# Patient Record
Sex: Male | Born: 1945 | Race: White | Hispanic: No | Marital: Married | Smoking: Former smoker
Health system: Southern US, Community
[De-identification: ages and names within clinical notes are randomized; demographics above are authoritative.]

## PROBLEM LIST (undated history)

## (undated) DIAGNOSIS — M069 Rheumatoid arthritis, unspecified: Secondary | ICD-10-CM

## (undated) DIAGNOSIS — C801 Malignant (primary) neoplasm, unspecified: Secondary | ICD-10-CM

## (undated) DIAGNOSIS — R918 Other nonspecific abnormal finding of lung field: Secondary | ICD-10-CM

## (undated) DIAGNOSIS — G893 Neoplasm related pain (acute) (chronic): Secondary | ICD-10-CM

## (undated) DIAGNOSIS — M549 Dorsalgia, unspecified: Secondary | ICD-10-CM

## (undated) DIAGNOSIS — N400 Enlarged prostate without lower urinary tract symptoms: Secondary | ICD-10-CM

## (undated) DIAGNOSIS — K219 Gastro-esophageal reflux disease without esophagitis: Secondary | ICD-10-CM

## (undated) DIAGNOSIS — Z9889 Other specified postprocedural states: Secondary | ICD-10-CM

## (undated) DIAGNOSIS — C189 Malignant neoplasm of colon, unspecified: Secondary | ICD-10-CM

## (undated) DIAGNOSIS — C719 Malignant neoplasm of brain, unspecified: Secondary | ICD-10-CM

## (undated) DIAGNOSIS — R1310 Dysphagia, unspecified: Secondary | ICD-10-CM

## (undated) DIAGNOSIS — C419 Malignant neoplasm of bone and articular cartilage, unspecified: Secondary | ICD-10-CM

## (undated) DIAGNOSIS — Z5111 Encounter for antineoplastic chemotherapy: Secondary | ICD-10-CM

## (undated) DIAGNOSIS — G8929 Other chronic pain: Secondary | ICD-10-CM

## (undated) DIAGNOSIS — R112 Nausea with vomiting, unspecified: Secondary | ICD-10-CM

## (undated) DIAGNOSIS — R519 Headache, unspecified: Secondary | ICD-10-CM

## (undated) DIAGNOSIS — C342 Malignant neoplasm of middle lobe, bronchus or lung: Secondary | ICD-10-CM

## (undated) DIAGNOSIS — M359 Systemic involvement of connective tissue, unspecified: Secondary | ICD-10-CM

## (undated) DIAGNOSIS — E2740 Unspecified adrenocortical insufficiency: Secondary | ICD-10-CM

## (undated) DIAGNOSIS — H9190 Unspecified hearing loss, unspecified ear: Secondary | ICD-10-CM

## (undated) DIAGNOSIS — I7300 Raynaud's syndrome without gangrene: Secondary | ICD-10-CM

## (undated) DIAGNOSIS — R51 Headache: Secondary | ICD-10-CM

## (undated) HISTORY — DX: Encounter for antineoplastic chemotherapy: Z51.11

## (undated) HISTORY — DX: Dysphagia, unspecified: R13.10

## (undated) HISTORY — DX: Rheumatoid arthritis, unspecified: M06.9

## (undated) HISTORY — DX: Benign prostatic hyperplasia without lower urinary tract symptoms: N40.0

## (undated) HISTORY — PX: APPENDECTOMY: SHX54

## (undated) HISTORY — DX: Malignant neoplasm of colon, unspecified: C18.9

## (undated) HISTORY — DX: Neoplasm related pain (acute) (chronic): G89.3

## (undated) HISTORY — DX: Malignant neoplasm of middle lobe, bronchus or lung: C34.2

---

## 1972-09-16 HISTORY — PX: GASTRECTOMY: SHX58

## 2001-08-21 ENCOUNTER — Encounter: Payer: Self-pay | Admitting: Neurosurgery

## 2001-08-25 ENCOUNTER — Ambulatory Visit (HOSPITAL_COMMUNITY): Admission: RE | Admit: 2001-08-25 | Discharge: 2001-08-26 | Payer: Self-pay | Admitting: Neurosurgery

## 2001-08-25 ENCOUNTER — Encounter: Payer: Self-pay | Admitting: Neurosurgery

## 2001-09-16 HISTORY — PX: LUMBAR DISC SURGERY: SHX700

## 2002-05-24 ENCOUNTER — Ambulatory Visit (HOSPITAL_COMMUNITY): Admission: RE | Admit: 2002-05-24 | Discharge: 2002-05-24 | Payer: Self-pay | Admitting: Gastroenterology

## 2002-05-24 ENCOUNTER — Encounter: Payer: Self-pay | Admitting: Gastroenterology

## 2002-06-02 ENCOUNTER — Encounter: Payer: Self-pay | Admitting: Surgery

## 2002-06-09 ENCOUNTER — Inpatient Hospital Stay (HOSPITAL_COMMUNITY): Admission: RE | Admit: 2002-06-09 | Discharge: 2002-06-14 | Payer: Self-pay | Admitting: Surgery

## 2002-06-09 ENCOUNTER — Encounter (INDEPENDENT_AMBULATORY_CARE_PROVIDER_SITE_OTHER): Payer: Self-pay | Admitting: Specialist

## 2002-07-06 ENCOUNTER — Encounter: Payer: Self-pay | Admitting: Oncology

## 2002-07-06 ENCOUNTER — Ambulatory Visit (HOSPITAL_COMMUNITY): Admission: RE | Admit: 2002-07-06 | Discharge: 2002-07-06 | Payer: Self-pay | Admitting: Oncology

## 2002-09-16 HISTORY — PX: COLECTOMY: SHX59

## 2003-07-05 ENCOUNTER — Encounter: Payer: Self-pay | Admitting: Oncology

## 2003-07-05 ENCOUNTER — Ambulatory Visit (HOSPITAL_COMMUNITY): Admission: RE | Admit: 2003-07-05 | Discharge: 2003-07-05 | Payer: Self-pay | Admitting: Oncology

## 2004-07-17 ENCOUNTER — Encounter: Payer: Self-pay | Admitting: Rheumatology

## 2004-08-20 ENCOUNTER — Ambulatory Visit: Payer: Self-pay | Admitting: Oncology

## 2004-08-23 ENCOUNTER — Ambulatory Visit (HOSPITAL_COMMUNITY): Admission: RE | Admit: 2004-08-23 | Discharge: 2004-08-23 | Payer: Self-pay | Admitting: Oncology

## 2005-02-15 ENCOUNTER — Ambulatory Visit: Payer: Self-pay | Admitting: Oncology

## 2005-02-27 ENCOUNTER — Ambulatory Visit (HOSPITAL_COMMUNITY): Admission: RE | Admit: 2005-02-27 | Discharge: 2005-02-27 | Payer: Self-pay | Admitting: Oncology

## 2005-07-19 ENCOUNTER — Ambulatory Visit: Payer: Self-pay | Admitting: Rheumatology

## 2005-08-15 ENCOUNTER — Ambulatory Visit: Payer: Self-pay | Admitting: Oncology

## 2005-09-16 HISTORY — PX: OTHER SURGICAL HISTORY: SHX169

## 2006-02-26 ENCOUNTER — Ambulatory Visit: Payer: Self-pay | Admitting: Oncology

## 2006-02-28 LAB — COMPREHENSIVE METABOLIC PANEL
ALT: 13 U/L (ref 0–40)
CO2: 26 mEq/L (ref 19–32)
Calcium: 8.9 mg/dL (ref 8.4–10.5)
Chloride: 103 mEq/L (ref 96–112)
Creatinine, Ser: 0.87 mg/dL (ref 0.40–1.50)
Glucose, Bld: 105 mg/dL — ABNORMAL HIGH (ref 70–99)
Total Protein: 6.7 g/dL (ref 6.0–8.3)

## 2006-02-28 LAB — CBC WITH DIFFERENTIAL/PLATELET
BASO%: 0.1 % (ref 0.0–2.0)
Eosinophils Absolute: 0 10*3/uL (ref 0.0–0.5)
HCT: 42 % (ref 38.7–49.9)
HGB: 14 g/dL (ref 13.0–17.1)
MCHC: 33.3 g/dL (ref 32.0–35.9)
MONO#: 0.4 10*3/uL (ref 0.1–0.9)
NEUT#: 7.5 10*3/uL — ABNORMAL HIGH (ref 1.5–6.5)
NEUT%: 84.5 % — ABNORMAL HIGH (ref 40.0–75.0)
Platelets: 275 10*3/uL (ref 145–400)
WBC: 8.9 10*3/uL (ref 4.0–10.0)
lymph#: 0.9 10*3/uL (ref 0.9–3.3)

## 2006-02-28 LAB — CEA: CEA: 1.6 ng/mL (ref 0.0–5.0)

## 2006-02-28 LAB — LACTATE DEHYDROGENASE: LDH: 140 U/L (ref 94–250)

## 2006-03-03 ENCOUNTER — Ambulatory Visit (HOSPITAL_COMMUNITY): Admission: RE | Admit: 2006-03-03 | Discharge: 2006-03-03 | Payer: Self-pay | Admitting: Oncology

## 2006-08-27 ENCOUNTER — Ambulatory Visit: Payer: Self-pay | Admitting: Oncology

## 2006-09-02 LAB — CBC WITH DIFFERENTIAL/PLATELET
Basophils Absolute: 0 10*3/uL (ref 0.0–0.1)
EOS%: 0.5 % (ref 0.0–7.0)
HCT: 42.5 % (ref 38.7–49.9)
HGB: 14 g/dL (ref 13.0–17.1)
LYMPH%: 16.1 % (ref 14.0–48.0)
MCH: 29.7 pg (ref 28.0–33.4)
MCV: 90.2 fL (ref 81.6–98.0)
MONO%: 6.8 % (ref 0.0–13.0)
NEUT%: 76.1 % — ABNORMAL HIGH (ref 40.0–75.0)

## 2006-09-02 LAB — COMPREHENSIVE METABOLIC PANEL
AST: 15 U/L (ref 0–37)
Alkaline Phosphatase: 68 U/L (ref 39–117)
BUN: 8 mg/dL (ref 6–23)
Calcium: 9.6 mg/dL (ref 8.4–10.5)
Creatinine, Ser: 0.68 mg/dL (ref 0.40–1.50)

## 2006-09-02 LAB — LACTATE DEHYDROGENASE: LDH: 165 U/L (ref 94–250)

## 2006-09-18 ENCOUNTER — Ambulatory Visit: Payer: Self-pay | Admitting: Gastroenterology

## 2006-09-26 ENCOUNTER — Ambulatory Visit: Payer: Self-pay | Admitting: Gastroenterology

## 2007-02-25 ENCOUNTER — Ambulatory Visit: Payer: Self-pay | Admitting: Oncology

## 2007-02-27 LAB — CBC WITH DIFFERENTIAL/PLATELET
Basophils Absolute: 0 10*3/uL (ref 0.0–0.1)
EOS%: 0.2 % (ref 0.0–7.0)
Eosinophils Absolute: 0 10*3/uL (ref 0.0–0.5)
LYMPH%: 9.2 % — ABNORMAL LOW (ref 14.0–48.0)
MCH: 31.6 pg (ref 28.0–33.4)
MCV: 91.8 fL (ref 81.6–98.0)
MONO%: 7.5 % (ref 0.0–13.0)
NEUT#: 7.5 10*3/uL — ABNORMAL HIGH (ref 1.5–6.5)
Platelets: 231 10*3/uL (ref 145–400)
RBC: 4.24 10*6/uL (ref 4.20–5.71)

## 2007-02-27 LAB — COMPREHENSIVE METABOLIC PANEL
Alkaline Phosphatase: 60 U/L (ref 39–117)
BUN: 10 mg/dL (ref 6–23)
Glucose, Bld: 111 mg/dL — ABNORMAL HIGH (ref 70–99)
Sodium: 138 mEq/L (ref 135–145)
Total Bilirubin: 0.8 mg/dL (ref 0.3–1.2)

## 2007-02-27 LAB — FECAL OCCULT BLOOD, GUAIAC: Occult Blood: NEGATIVE

## 2007-02-27 LAB — CEA: CEA: 1.4 ng/mL (ref 0.0–5.0)

## 2007-03-02 ENCOUNTER — Ambulatory Visit (HOSPITAL_COMMUNITY): Admission: RE | Admit: 2007-03-02 | Discharge: 2007-03-02 | Payer: Self-pay | Admitting: Oncology

## 2007-03-12 ENCOUNTER — Ambulatory Visit (HOSPITAL_COMMUNITY): Admission: RE | Admit: 2007-03-12 | Discharge: 2007-03-12 | Payer: Self-pay | Admitting: Oncology

## 2007-03-18 ENCOUNTER — Ambulatory Visit: Payer: Self-pay | Admitting: Thoracic Surgery

## 2007-03-25 ENCOUNTER — Encounter: Payer: Self-pay | Admitting: Thoracic Surgery

## 2007-03-25 ENCOUNTER — Ambulatory Visit (HOSPITAL_COMMUNITY): Admission: RE | Admit: 2007-03-25 | Discharge: 2007-03-25 | Payer: Self-pay | Admitting: Thoracic Surgery

## 2007-03-25 ENCOUNTER — Ambulatory Visit: Payer: Self-pay | Admitting: Thoracic Surgery

## 2007-03-26 ENCOUNTER — Ambulatory Visit: Payer: Self-pay | Admitting: Thoracic Surgery

## 2007-04-03 ENCOUNTER — Ambulatory Visit: Admission: RE | Admit: 2007-04-03 | Discharge: 2007-06-15 | Payer: Self-pay | Admitting: Radiation Oncology

## 2007-04-13 ENCOUNTER — Ambulatory Visit (HOSPITAL_COMMUNITY): Admission: RE | Admit: 2007-04-13 | Discharge: 2007-04-13 | Payer: Self-pay | Admitting: Oncology

## 2007-04-14 ENCOUNTER — Ambulatory Visit: Payer: Self-pay | Admitting: Oncology

## 2007-04-20 LAB — CBC WITH DIFFERENTIAL/PLATELET
BASO%: 0.4 % (ref 0.0–2.0)
EOS%: 0.2 % (ref 0.0–7.0)
LYMPH%: 17.7 % (ref 14.0–48.0)
MCHC: 34.5 g/dL (ref 32.0–35.9)
MCV: 90.7 fL (ref 81.6–98.0)
MONO%: 6.9 % (ref 0.0–13.0)
Platelets: 208 10*3/uL (ref 145–400)
RBC: 4.2 10*6/uL (ref 4.20–5.71)

## 2007-04-27 LAB — CBC WITH DIFFERENTIAL/PLATELET
BASO%: 0.2 % (ref 0.0–2.0)
LYMPH%: 10.3 % — ABNORMAL LOW (ref 14.0–48.0)
MCHC: 35 g/dL (ref 32.0–35.9)
MONO#: 0.4 10*3/uL (ref 0.1–0.9)
RBC: 4.07 10*6/uL — ABNORMAL LOW (ref 4.20–5.71)
WBC: 8 10*3/uL (ref 4.0–10.0)
lymph#: 0.8 10*3/uL — ABNORMAL LOW (ref 0.9–3.3)

## 2007-04-27 LAB — BASIC METABOLIC PANEL
CO2: 21 mEq/L (ref 19–32)
Calcium: 9.3 mg/dL (ref 8.4–10.5)
Sodium: 137 mEq/L (ref 135–145)

## 2007-05-04 LAB — CBC WITH DIFFERENTIAL/PLATELET
Basophils Absolute: 0 10*3/uL (ref 0.0–0.1)
EOS%: 0.2 % (ref 0.0–7.0)
Eosinophils Absolute: 0 10*3/uL (ref 0.0–0.5)
HCT: 36 % — ABNORMAL LOW (ref 38.7–49.9)
HGB: 12.7 g/dL — ABNORMAL LOW (ref 13.0–17.1)
MCH: 32 pg (ref 28.0–33.4)
MONO#: 0.6 10*3/uL (ref 0.1–0.9)
NEUT%: 83.2 % — ABNORMAL HIGH (ref 40.0–75.0)
lymph#: 0.5 10*3/uL — ABNORMAL LOW (ref 0.9–3.3)

## 2007-05-04 LAB — BASIC METABOLIC PANEL
BUN: 11 mg/dL (ref 6–23)
CO2: 22 mEq/L (ref 19–32)
Chloride: 102 mEq/L (ref 96–112)
Creatinine, Ser: 0.63 mg/dL (ref 0.40–1.50)
Glucose, Bld: 99 mg/dL (ref 70–99)

## 2007-05-11 LAB — CBC WITH DIFFERENTIAL/PLATELET
BASO%: 1 % (ref 0.0–2.0)
Basophils Absolute: 0 10*3/uL (ref 0.0–0.1)
HCT: 32.6 % — ABNORMAL LOW (ref 38.7–49.9)
HGB: 11.6 g/dL — ABNORMAL LOW (ref 13.0–17.1)
LYMPH%: 11.3 % — ABNORMAL LOW (ref 14.0–48.0)
MCHC: 35.6 g/dL (ref 32.0–35.9)
MONO#: 0.4 10*3/uL (ref 0.1–0.9)
NEUT%: 78.3 % — ABNORMAL HIGH (ref 40.0–75.0)
Platelets: 143 10*3/uL — ABNORMAL LOW (ref 145–400)
WBC: 4.7 10*3/uL (ref 4.0–10.0)

## 2007-05-11 LAB — BASIC METABOLIC PANEL
BUN: 11 mg/dL (ref 6–23)
CO2: 22 mEq/L (ref 19–32)
Calcium: 9.4 mg/dL (ref 8.4–10.5)
Chloride: 104 mEq/L (ref 96–112)
Creatinine, Ser: 0.65 mg/dL (ref 0.40–1.50)
Glucose, Bld: 118 mg/dL — ABNORMAL HIGH (ref 70–99)

## 2007-05-19 LAB — CBC WITH DIFFERENTIAL/PLATELET
BASO%: 0.3 % (ref 0.0–2.0)
EOS%: 0.9 % (ref 0.0–7.0)
HGB: 11.4 g/dL — ABNORMAL LOW (ref 13.0–17.1)
MCH: 32.3 pg (ref 28.0–33.4)
MCHC: 35.8 g/dL (ref 32.0–35.9)
MCV: 90.1 fL (ref 81.6–98.0)
MONO%: 6.6 % (ref 0.0–13.0)
RBC: 3.53 10*6/uL — ABNORMAL LOW (ref 4.20–5.71)
RDW: 17.1 % — ABNORMAL HIGH (ref 11.2–14.6)
lymph#: 0.5 10*3/uL — ABNORMAL LOW (ref 0.9–3.3)

## 2007-05-19 LAB — BASIC METABOLIC PANEL
BUN: 10 mg/dL (ref 6–23)
Chloride: 102 mEq/L (ref 96–112)
Potassium: 3.6 mEq/L (ref 3.5–5.3)

## 2007-05-25 LAB — CBC WITH DIFFERENTIAL/PLATELET
Basophils Absolute: 0 10*3/uL (ref 0.0–0.1)
Eosinophils Absolute: 0 10*3/uL (ref 0.0–0.5)
HGB: 11.9 g/dL — ABNORMAL LOW (ref 13.0–17.1)
LYMPH%: 9.7 % — ABNORMAL LOW (ref 14.0–48.0)
MCV: 91.4 fL (ref 81.6–98.0)
MONO#: 0.4 10*3/uL (ref 0.1–0.9)
MONO%: 7.8 % (ref 0.0–13.0)
NEUT#: 3.9 10*3/uL (ref 1.5–6.5)
Platelets: 130 10*3/uL — ABNORMAL LOW (ref 145–400)
RBC: 3.65 10*6/uL — ABNORMAL LOW (ref 4.20–5.71)
RDW: 18.1 % — ABNORMAL HIGH (ref 11.2–14.6)
WBC: 4.8 10*3/uL (ref 4.0–10.0)

## 2007-05-25 LAB — BASIC METABOLIC PANEL
BUN: 16 mg/dL (ref 6–23)
CO2: 22 mEq/L (ref 19–32)
Glucose, Bld: 114 mg/dL — ABNORMAL HIGH (ref 70–99)
Potassium: 3.6 mEq/L (ref 3.5–5.3)
Sodium: 134 mEq/L — ABNORMAL LOW (ref 135–145)

## 2007-05-28 ENCOUNTER — Ambulatory Visit: Payer: Self-pay | Admitting: Oncology

## 2007-06-01 LAB — CBC WITH DIFFERENTIAL/PLATELET
Basophils Absolute: 0 10*3/uL (ref 0.0–0.1)
HCT: 32.1 % — ABNORMAL LOW (ref 38.7–49.9)
HGB: 11.5 g/dL — ABNORMAL LOW (ref 13.0–17.1)
MCH: 32.9 pg (ref 28.0–33.4)
MONO#: 0.2 10*3/uL (ref 0.1–0.9)
NEUT%: 79 % — ABNORMAL HIGH (ref 40.0–75.0)
lymph#: 0.4 10*3/uL — ABNORMAL LOW (ref 0.9–3.3)

## 2007-06-01 LAB — COMPREHENSIVE METABOLIC PANEL
AST: 20 U/L (ref 0–37)
BUN: 10 mg/dL (ref 6–23)
Calcium: 8.8 mg/dL (ref 8.4–10.5)
Chloride: 104 mEq/L (ref 96–112)
Creatinine, Ser: 0.7 mg/dL (ref 0.40–1.50)
Total Bilirubin: 0.7 mg/dL (ref 0.3–1.2)

## 2007-06-08 ENCOUNTER — Ambulatory Visit (HOSPITAL_COMMUNITY): Admission: RE | Admit: 2007-06-08 | Discharge: 2007-06-08 | Payer: Self-pay | Admitting: Oncology

## 2007-06-08 LAB — COMPREHENSIVE METABOLIC PANEL
BUN: 11 mg/dL (ref 6–23)
CO2: 23 mEq/L (ref 19–32)
Calcium: 9 mg/dL (ref 8.4–10.5)
Chloride: 103 mEq/L (ref 96–112)
Creatinine, Ser: 0.83 mg/dL (ref 0.40–1.50)

## 2007-06-08 LAB — CBC WITH DIFFERENTIAL/PLATELET
BASO%: 0.5 % (ref 0.0–2.0)
Basophils Absolute: 0 10*3/uL (ref 0.0–0.1)
EOS%: 0.4 % (ref 0.0–7.0)
Eosinophils Absolute: 0 10*3/uL (ref 0.0–0.5)
HCT: 32.1 % — ABNORMAL LOW (ref 38.7–49.9)
HGB: 11.6 g/dL — ABNORMAL LOW (ref 13.0–17.1)
LYMPH%: 12.1 % — ABNORMAL LOW (ref 14.0–48.0)
MCH: 32.8 pg (ref 28.0–33.4)
MCHC: 36 g/dL — ABNORMAL HIGH (ref 32.0–35.9)
MCV: 90.9 fL (ref 81.6–98.0)
MONO#: 0.3 10*3/uL (ref 0.1–0.9)
MONO%: 8.7 % (ref 0.0–13.0)
NEUT#: 2.5 10*3/uL (ref 1.5–6.5)
NEUT%: 78.3 % — ABNORMAL HIGH (ref 40.0–75.0)
Platelets: 218 10*3/uL (ref 145–400)
RBC: 3.53 10*6/uL — ABNORMAL LOW (ref 4.20–5.71)
RDW: 20.2 % — ABNORMAL HIGH (ref 11.2–14.6)
WBC: 3.2 10*3/uL — ABNORMAL LOW (ref 4.0–10.0)
lymph#: 0.4 10*3/uL — ABNORMAL LOW (ref 0.9–3.3)

## 2007-06-22 LAB — CBC WITH DIFFERENTIAL/PLATELET
Basophils Absolute: 0 10*3/uL (ref 0.0–0.1)
EOS%: 0.2 % (ref 0.0–7.0)
Eosinophils Absolute: 0 10*3/uL (ref 0.0–0.5)
HCT: 29.2 % — ABNORMAL LOW (ref 38.7–49.9)
HGB: 10.6 g/dL — ABNORMAL LOW (ref 13.0–17.1)
MCH: 34.1 pg — ABNORMAL HIGH (ref 28.0–33.4)
MCV: 93.6 fL (ref 81.6–98.0)
NEUT%: 78.1 % — ABNORMAL HIGH (ref 40.0–75.0)
lymph#: 0.5 10*3/uL — ABNORMAL LOW (ref 0.9–3.3)

## 2007-06-22 LAB — MORPHOLOGY

## 2007-07-20 ENCOUNTER — Ambulatory Visit: Payer: Self-pay | Admitting: Oncology

## 2007-07-20 ENCOUNTER — Ambulatory Visit (HOSPITAL_COMMUNITY): Admission: RE | Admit: 2007-07-20 | Discharge: 2007-07-20 | Payer: Self-pay | Admitting: Oncology

## 2007-07-22 LAB — CBC WITH DIFFERENTIAL/PLATELET
BASO%: 0.3 % (ref 0.0–2.0)
Basophils Absolute: 0 10*3/uL (ref 0.0–0.1)
EOS%: 0.2 % (ref 0.0–7.0)
HCT: 32.6 % — ABNORMAL LOW (ref 38.7–49.9)
HGB: 11.5 g/dL — ABNORMAL LOW (ref 13.0–17.1)
MCH: 36 pg — ABNORMAL HIGH (ref 28.0–33.4)
MCHC: 35.2 g/dL (ref 32.0–35.9)
MCV: 102.2 fL — ABNORMAL HIGH (ref 81.6–98.0)
MONO%: 6.6 % (ref 0.0–13.0)
NEUT%: 81.4 % — ABNORMAL HIGH (ref 40.0–75.0)
RDW: 20.6 % — ABNORMAL HIGH (ref 11.2–14.6)

## 2007-07-22 LAB — COMPREHENSIVE METABOLIC PANEL
AST: 16 U/L (ref 0–37)
Alkaline Phosphatase: 59 U/L (ref 39–117)
BUN: 9 mg/dL (ref 6–23)
Creatinine, Ser: 0.82 mg/dL (ref 0.40–1.50)

## 2007-08-05 ENCOUNTER — Ambulatory Visit: Payer: Self-pay | Admitting: Thoracic Surgery

## 2007-08-20 ENCOUNTER — Encounter: Payer: Self-pay | Admitting: Thoracic Surgery

## 2007-08-20 ENCOUNTER — Inpatient Hospital Stay (HOSPITAL_COMMUNITY): Admission: RE | Admit: 2007-08-20 | Discharge: 2007-08-23 | Payer: Self-pay | Admitting: Thoracic Surgery

## 2007-08-24 ENCOUNTER — Ambulatory Visit: Payer: Self-pay | Admitting: Thoracic Surgery

## 2007-08-27 ENCOUNTER — Ambulatory Visit: Payer: Self-pay | Admitting: Thoracic Surgery

## 2007-08-27 ENCOUNTER — Encounter: Admission: RE | Admit: 2007-08-27 | Discharge: 2007-08-27 | Payer: Self-pay | Admitting: Thoracic Surgery

## 2007-09-15 ENCOUNTER — Encounter: Admission: RE | Admit: 2007-09-15 | Discharge: 2007-09-15 | Payer: Self-pay | Admitting: Thoracic Surgery

## 2007-09-15 ENCOUNTER — Ambulatory Visit: Payer: Self-pay | Admitting: Thoracic Surgery

## 2007-10-13 ENCOUNTER — Ambulatory Visit: Payer: Self-pay | Admitting: Thoracic Surgery

## 2007-10-13 ENCOUNTER — Encounter: Admission: RE | Admit: 2007-10-13 | Discharge: 2007-10-13 | Payer: Self-pay | Admitting: Thoracic Surgery

## 2007-10-26 ENCOUNTER — Ambulatory Visit: Payer: Self-pay | Admitting: Oncology

## 2007-10-28 LAB — COMPREHENSIVE METABOLIC PANEL
ALT: 17 U/L (ref 0–53)
AST: 20 U/L (ref 0–37)
Chloride: 102 mEq/L (ref 96–112)
Creatinine, Ser: 0.81 mg/dL (ref 0.40–1.50)
Sodium: 137 mEq/L (ref 135–145)
Total Bilirubin: 0.8 mg/dL (ref 0.3–1.2)

## 2007-10-28 LAB — CBC WITH DIFFERENTIAL/PLATELET
BASO%: 0.5 % (ref 0.0–2.0)
EOS%: 0.4 % (ref 0.0–7.0)
HCT: 38.1 % — ABNORMAL LOW (ref 38.7–49.9)
MCH: 31.3 pg (ref 28.0–33.4)
MCHC: 34.2 g/dL (ref 32.0–35.9)
NEUT%: 78.7 % — ABNORMAL HIGH (ref 40.0–75.0)
RBC: 4.16 10*6/uL — ABNORMAL LOW (ref 4.20–5.71)
lymph#: 0.9 10*3/uL (ref 0.9–3.3)

## 2007-12-09 ENCOUNTER — Ambulatory Visit: Payer: Self-pay | Admitting: Thoracic Surgery

## 2007-12-09 ENCOUNTER — Encounter: Admission: RE | Admit: 2007-12-09 | Discharge: 2007-12-09 | Payer: Self-pay | Admitting: Thoracic Surgery

## 2008-01-11 ENCOUNTER — Ambulatory Visit: Payer: Self-pay | Admitting: Oncology

## 2008-01-14 LAB — COMPREHENSIVE METABOLIC PANEL
ALT: 24 U/L (ref 0–53)
AST: 26 U/L (ref 0–37)
Albumin: 4 g/dL (ref 3.5–5.2)
Alkaline Phosphatase: 60 U/L (ref 39–117)
Chloride: 100 mEq/L (ref 96–112)
Potassium: 4.2 mEq/L (ref 3.5–5.3)
Sodium: 135 mEq/L (ref 135–145)
Total Protein: 7 g/dL (ref 6.0–8.3)

## 2008-01-14 LAB — CBC WITH DIFFERENTIAL/PLATELET
EOS%: 0.7 % (ref 0.0–7.0)
Eosinophils Absolute: 0 10*3/uL (ref 0.0–0.5)
MCH: 30.5 pg (ref 28.0–33.4)
MCV: 89.6 fL (ref 81.6–98.0)
MONO%: 5.8 % (ref 0.0–13.0)
NEUT#: 4.5 10*3/uL (ref 1.5–6.5)
RBC: 4.35 10*6/uL (ref 4.20–5.71)
RDW: 15.7 % — ABNORMAL HIGH (ref 11.2–14.6)
lymph#: 0.7 10*3/uL — ABNORMAL LOW (ref 0.9–3.3)

## 2008-01-18 ENCOUNTER — Ambulatory Visit (HOSPITAL_COMMUNITY): Admission: RE | Admit: 2008-01-18 | Discharge: 2008-01-18 | Payer: Self-pay | Admitting: Oncology

## 2008-01-27 ENCOUNTER — Ambulatory Visit: Payer: Self-pay | Admitting: Thoracic Surgery

## 2008-05-10 ENCOUNTER — Ambulatory Visit: Payer: Self-pay | Admitting: Oncology

## 2008-05-12 LAB — CBC WITH DIFFERENTIAL/PLATELET
Basophils Absolute: 0 10*3/uL (ref 0.0–0.1)
EOS%: 0.2 % (ref 0.0–7.0)
Eosinophils Absolute: 0 10*3/uL (ref 0.0–0.5)
HGB: 13.1 g/dL (ref 13.0–17.1)
MCH: 31.7 pg (ref 28.0–33.4)
NEUT#: 6.8 10*3/uL — ABNORMAL HIGH (ref 1.5–6.5)
RBC: 4.12 10*6/uL — ABNORMAL LOW (ref 4.20–5.71)
RDW: 14.4 % (ref 11.2–14.6)
lymph#: 0.6 10*3/uL — ABNORMAL LOW (ref 0.9–3.3)

## 2008-05-13 LAB — LACTATE DEHYDROGENASE: LDH: 146 U/L (ref 94–250)

## 2008-05-13 LAB — COMPREHENSIVE METABOLIC PANEL
ALT: 18 U/L (ref 0–53)
Albumin: 3.9 g/dL (ref 3.5–5.2)
Alkaline Phosphatase: 55 U/L (ref 39–117)
Glucose, Bld: 200 mg/dL — ABNORMAL HIGH (ref 70–99)
Potassium: 3.9 mEq/L (ref 3.5–5.3)
Sodium: 136 mEq/L (ref 135–145)
Total Bilirubin: 0.6 mg/dL (ref 0.3–1.2)
Total Protein: 6.6 g/dL (ref 6.0–8.3)

## 2008-05-13 LAB — CEA: CEA: 1.6 ng/mL (ref 0.0–5.0)

## 2008-05-16 ENCOUNTER — Ambulatory Visit (HOSPITAL_COMMUNITY): Admission: RE | Admit: 2008-05-16 | Discharge: 2008-05-16 | Payer: Self-pay | Admitting: Oncology

## 2008-06-04 IMAGING — CR DG CHEST 2V
2 series · 2 of 2 positions shown · non-contrast
Comparison: 08/27/07 and CT chest, 07/20/07.

Note:  Dictation and final report delayed due to catastrophic system-wide PACS failure.
CLINICAL DATA: Post lung surgery.  
TWO VIEW CHEST:

[w chest pa]
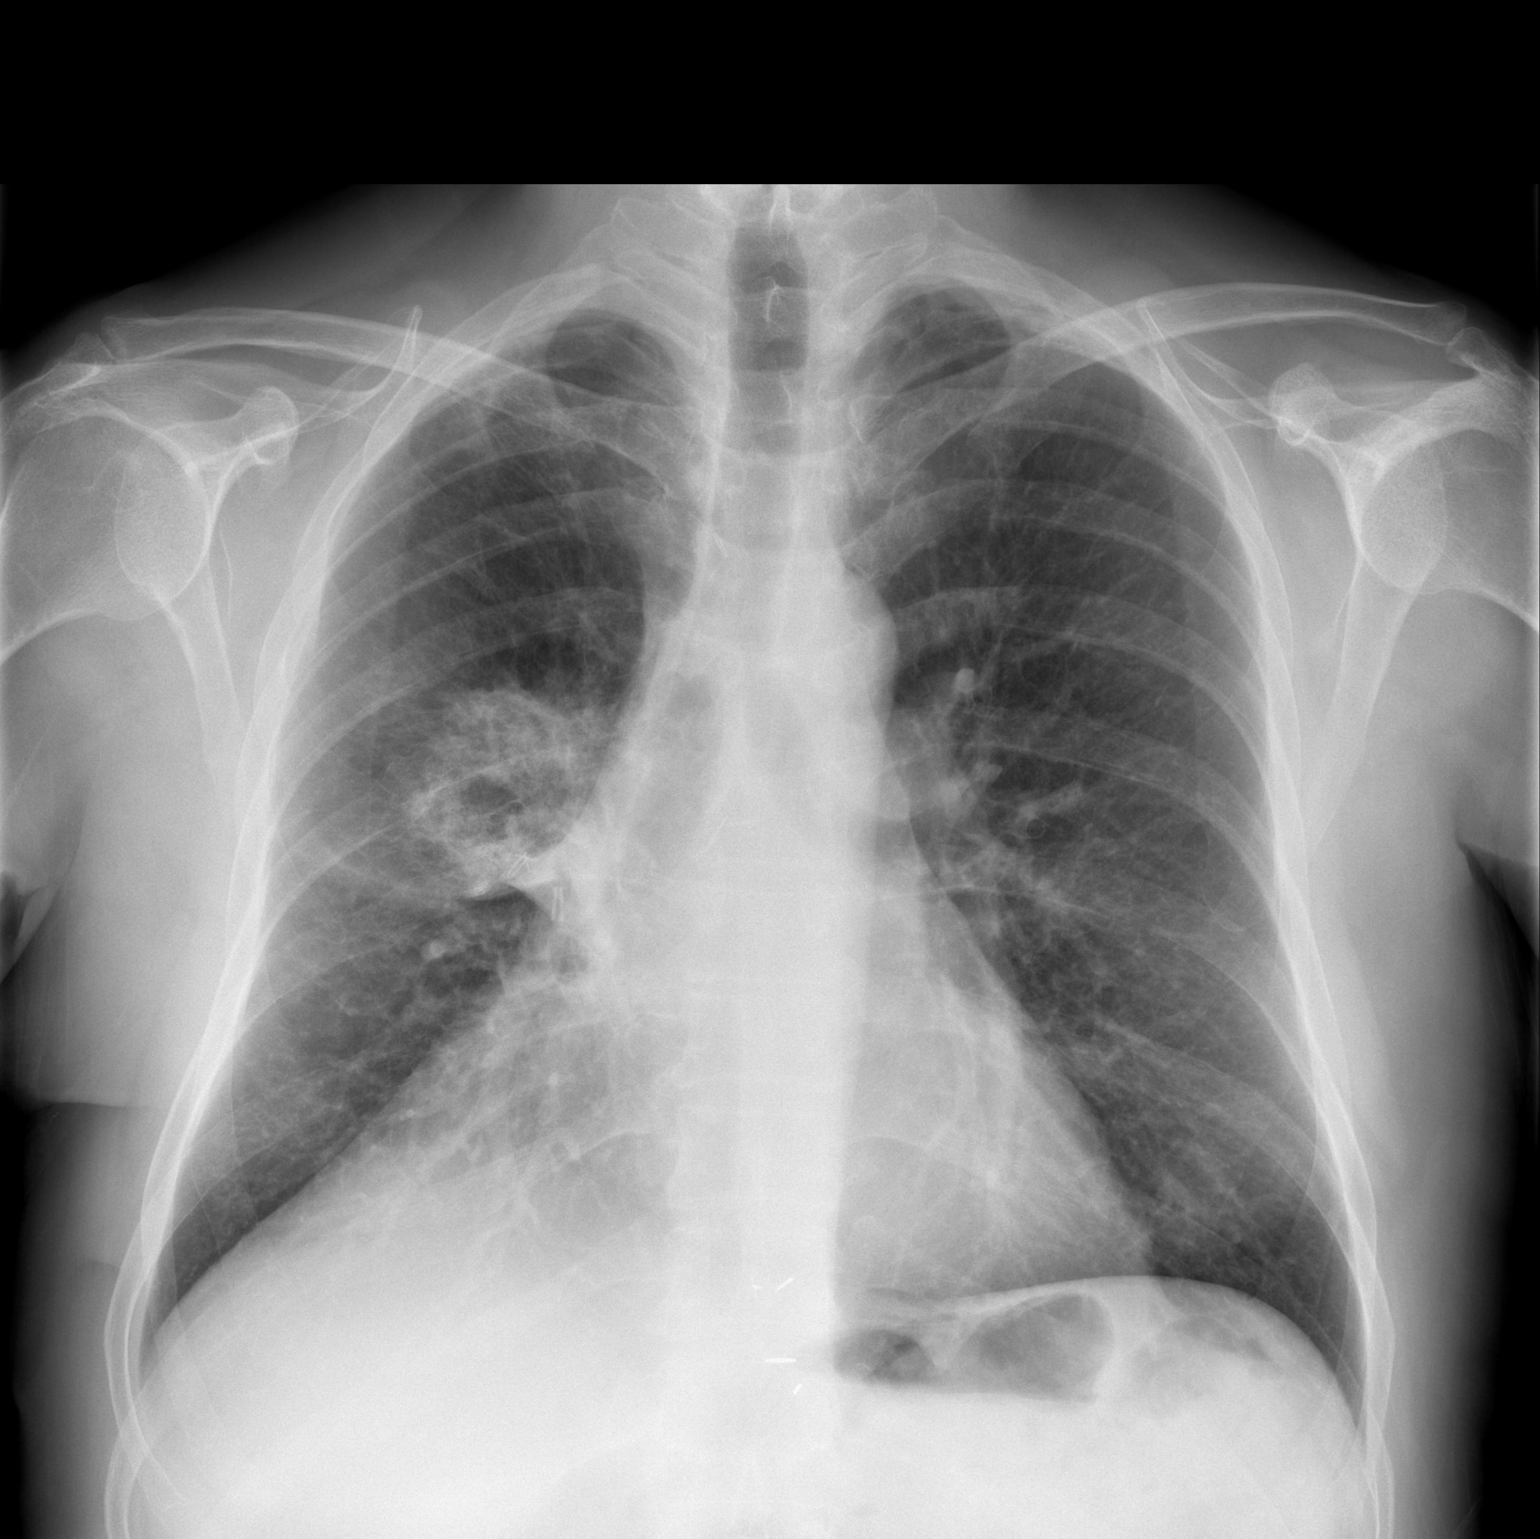

[w chest lat]
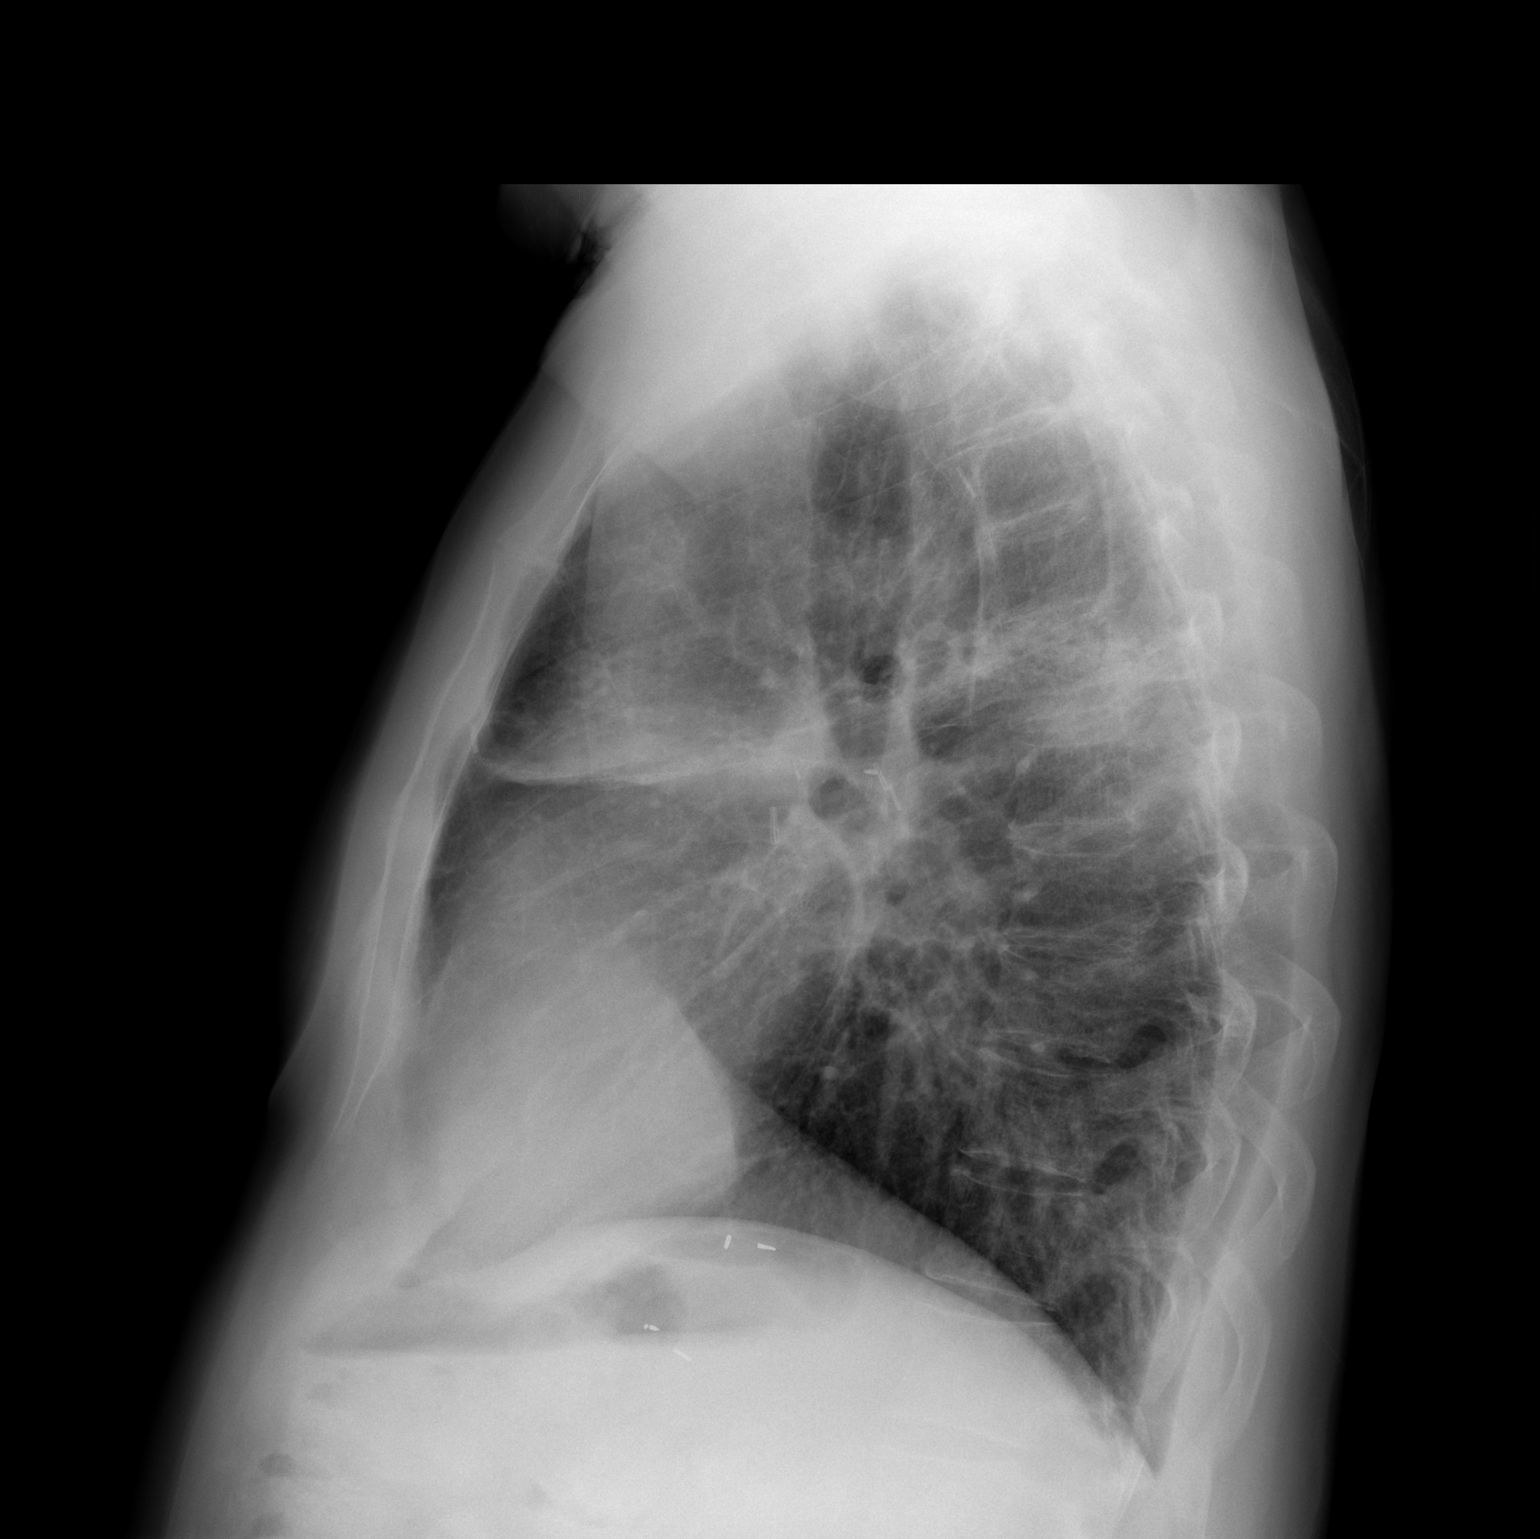

[2 of 2 positions shown; findings below may reference images not displayed]

FINDINGS: Trachea is midline.  Heart size stable.  There are postoperative changes in the right hemithorax with new rounded opacity in the posterior aspect of the right upper lung zone.  Airspace disease is again seen along the minor fissure.  Lungs are emphysematous.  Biapical scarring.
IMPRESSION: Postoperative changes in the right hemithorax with new airspace disease posteriorly in the right upper lung zone.  This may represent pulmonary hemorrhage or pneumonia.

## 2008-06-22 ENCOUNTER — Ambulatory Visit: Payer: Self-pay | Admitting: Thoracic Surgery

## 2008-09-16 HISTORY — PX: OTHER SURGICAL HISTORY: SHX169

## 2008-09-19 ENCOUNTER — Ambulatory Visit: Payer: Self-pay | Admitting: Oncology

## 2008-09-21 ENCOUNTER — Ambulatory Visit (HOSPITAL_COMMUNITY): Admission: RE | Admit: 2008-09-21 | Discharge: 2008-09-21 | Payer: Self-pay | Admitting: Oncology

## 2008-09-21 LAB — CBC WITH DIFFERENTIAL/PLATELET
BASO%: 0.3 % (ref 0.0–2.0)
EOS%: 0.9 % (ref 0.0–7.0)
MCH: 31.8 pg (ref 28.0–33.4)
MCHC: 34.7 g/dL (ref 32.0–35.9)
MONO#: 0.4 10*3/uL (ref 0.1–0.9)
RDW: 14.7 % — ABNORMAL HIGH (ref 11.2–14.6)
WBC: 5 10*3/uL (ref 4.0–10.0)
lymph#: 0.9 10*3/uL (ref 0.9–3.3)

## 2008-09-21 LAB — COMPREHENSIVE METABOLIC PANEL
AST: 25 U/L (ref 0–37)
CO2: 26 mEq/L (ref 19–32)
Chloride: 102 mEq/L (ref 96–112)
Creatinine, Ser: 0.8 mg/dL (ref 0.40–1.50)
Potassium: 4.2 mEq/L (ref 3.5–5.3)
Sodium: 136 mEq/L (ref 135–145)

## 2008-09-21 LAB — CEA: CEA: 1.4 ng/mL (ref 0.0–5.0)

## 2008-09-21 LAB — LACTATE DEHYDROGENASE: LDH: 131 U/L (ref 94–250)

## 2008-09-21 LAB — FECAL OCCULT BLOOD, GUAIAC: Occult Blood: NEGATIVE

## 2008-10-04 ENCOUNTER — Ambulatory Visit: Payer: Self-pay | Admitting: Thoracic Surgery

## 2009-01-18 ENCOUNTER — Ambulatory Visit: Payer: Self-pay | Admitting: Oncology

## 2009-01-20 ENCOUNTER — Ambulatory Visit (HOSPITAL_COMMUNITY): Admission: RE | Admit: 2009-01-20 | Discharge: 2009-01-20 | Payer: Self-pay | Admitting: Oncology

## 2009-01-20 LAB — CBC WITH DIFFERENTIAL/PLATELET
Basophils Absolute: 0 10*3/uL (ref 0.0–0.1)
Eosinophils Absolute: 0 10*3/uL (ref 0.0–0.5)
HGB: 13.4 g/dL (ref 13.0–17.1)
LYMPH%: 12.3 % — ABNORMAL LOW (ref 14.0–49.0)
MCV: 92.3 fL (ref 79.3–98.0)
MONO#: 0.4 10*3/uL (ref 0.1–0.9)
MONO%: 7.8 % (ref 0.0–14.0)
NEUT#: 4.4 10*3/uL (ref 1.5–6.5)
Platelets: 184 10*3/uL (ref 140–400)
RBC: 4.33 10*6/uL (ref 4.20–5.82)
WBC: 5.5 10*3/uL (ref 4.0–10.3)

## 2009-01-20 LAB — CEA: CEA: 1.5 ng/mL (ref 0.0–5.0)

## 2009-01-20 LAB — COMPREHENSIVE METABOLIC PANEL
ALT: 14 U/L (ref 0–53)
CO2: 27 mEq/L (ref 19–32)
Calcium: 9.1 mg/dL (ref 8.4–10.5)
Chloride: 101 mEq/L (ref 96–112)
Creatinine, Ser: 0.75 mg/dL (ref 0.40–1.50)
Glucose, Bld: 105 mg/dL — ABNORMAL HIGH (ref 70–99)
Sodium: 135 mEq/L (ref 135–145)
Total Bilirubin: 0.9 mg/dL (ref 0.3–1.2)
Total Protein: 6.7 g/dL (ref 6.0–8.3)

## 2009-01-20 LAB — LACTATE DEHYDROGENASE: LDH: 131 U/L (ref 94–250)

## 2009-01-31 ENCOUNTER — Ambulatory Visit: Payer: Self-pay | Admitting: Thoracic Surgery

## 2009-07-12 ENCOUNTER — Ambulatory Visit: Payer: Self-pay | Admitting: Oncology

## 2009-07-17 ENCOUNTER — Ambulatory Visit (HOSPITAL_COMMUNITY): Admission: RE | Admit: 2009-07-17 | Discharge: 2009-07-17 | Payer: Self-pay | Admitting: Oncology

## 2009-07-17 LAB — COMPREHENSIVE METABOLIC PANEL
ALT: 20 U/L (ref 0–53)
AST: 20 U/L (ref 0–37)
Alkaline Phosphatase: 54 U/L (ref 39–117)
BUN: 8 mg/dL (ref 6–23)
Creatinine, Ser: 0.89 mg/dL (ref 0.40–1.50)
Total Bilirubin: 1.1 mg/dL (ref 0.3–1.2)

## 2009-07-17 LAB — CBC WITH DIFFERENTIAL/PLATELET
BASO%: 0.4 % (ref 0.0–2.0)
Basophils Absolute: 0 10*3/uL (ref 0.0–0.1)
EOS%: 0.6 % (ref 0.0–7.0)
HCT: 41.7 % (ref 38.4–49.9)
LYMPH%: 18.4 % (ref 14.0–49.0)
MCH: 32.4 pg (ref 27.2–33.4)
MCHC: 33.6 g/dL (ref 32.0–36.0)
MCV: 96.4 fL (ref 79.3–98.0)
MONO%: 6.8 % (ref 0.0–14.0)
NEUT%: 73.8 % (ref 39.0–75.0)
lymph#: 0.9 10*3/uL (ref 0.9–3.3)

## 2009-07-17 LAB — FECAL OCCULT BLOOD, GUAIAC: Occult Blood: NEGATIVE

## 2009-08-30 ENCOUNTER — Encounter (INDEPENDENT_AMBULATORY_CARE_PROVIDER_SITE_OTHER): Payer: Self-pay | Admitting: *Deleted

## 2010-01-18 ENCOUNTER — Encounter (INDEPENDENT_AMBULATORY_CARE_PROVIDER_SITE_OTHER): Payer: Self-pay | Admitting: *Deleted

## 2010-01-31 ENCOUNTER — Encounter (INDEPENDENT_AMBULATORY_CARE_PROVIDER_SITE_OTHER): Payer: Self-pay | Admitting: *Deleted

## 2010-02-02 ENCOUNTER — Ambulatory Visit: Payer: Self-pay | Admitting: Gastroenterology

## 2010-02-13 ENCOUNTER — Telehealth: Payer: Self-pay | Admitting: Gastroenterology

## 2010-02-16 ENCOUNTER — Ambulatory Visit: Payer: Self-pay | Admitting: Gastroenterology

## 2010-02-16 ENCOUNTER — Ambulatory Visit: Payer: Self-pay | Admitting: Oncology

## 2010-02-20 ENCOUNTER — Ambulatory Visit (HOSPITAL_COMMUNITY): Admission: RE | Admit: 2010-02-20 | Discharge: 2010-02-20 | Payer: Self-pay | Admitting: Oncology

## 2010-02-20 LAB — CBC WITH DIFFERENTIAL/PLATELET
BASO%: 0.8 % (ref 0.0–2.0)
Basophils Absolute: 0 10*3/uL (ref 0.0–0.1)
EOS%: 0.3 % (ref 0.0–7.0)
MCH: 33.1 pg (ref 27.2–33.4)
MCHC: 34.1 g/dL (ref 32.0–36.0)
MCV: 97.1 fL (ref 79.3–98.0)
MONO%: 9.1 % (ref 0.0–14.0)
RBC: 4.29 10*6/uL (ref 4.20–5.82)
RDW: 14.6 % (ref 11.0–14.6)

## 2010-02-20 LAB — COMPREHENSIVE METABOLIC PANEL
AST: 21 U/L (ref 0–37)
Albumin: 3.9 g/dL (ref 3.5–5.2)
Alkaline Phosphatase: 57 U/L (ref 39–117)
BUN: 5 mg/dL — ABNORMAL LOW (ref 6–23)
Potassium: 4.4 mEq/L (ref 3.5–5.3)
Sodium: 136 mEq/L (ref 135–145)

## 2010-02-27 ENCOUNTER — Encounter: Payer: Self-pay | Admitting: Gastroenterology

## 2010-08-23 ENCOUNTER — Ambulatory Visit: Payer: Self-pay | Admitting: Oncology

## 2010-08-23 ENCOUNTER — Ambulatory Visit (HOSPITAL_COMMUNITY)
Admission: RE | Admit: 2010-08-23 | Discharge: 2010-08-23 | Payer: Self-pay | Source: Home / Self Care | Attending: Oncology | Admitting: Oncology

## 2010-08-23 LAB — CBC WITH DIFFERENTIAL/PLATELET
Eosinophils Absolute: 0 10*3/uL (ref 0.0–0.5)
HCT: 42.7 % (ref 38.4–49.9)
LYMPH%: 14.4 % (ref 14.0–49.0)
MCHC: 33.3 g/dL (ref 32.0–36.0)
MONO#: 0.4 10*3/uL (ref 0.1–0.9)
NEUT%: 76.9 % — ABNORMAL HIGH (ref 39.0–75.0)
Platelets: 183 10*3/uL (ref 140–400)
WBC: 4.8 10*3/uL (ref 4.0–10.3)

## 2010-08-23 LAB — LACTATE DEHYDROGENASE: LDH: 135 U/L (ref 94–250)

## 2010-08-23 LAB — COMPREHENSIVE METABOLIC PANEL
BUN: 6 mg/dL (ref 6–23)
CO2: 25 mEq/L (ref 19–32)
Creatinine, Ser: 0.76 mg/dL (ref 0.40–1.50)
Glucose, Bld: 94 mg/dL (ref 70–99)
Total Bilirubin: 0.5 mg/dL (ref 0.3–1.2)

## 2010-08-23 LAB — CEA: CEA: 1.2 ng/mL (ref 0.0–5.0)

## 2010-10-05 ENCOUNTER — Other Ambulatory Visit: Payer: Self-pay | Admitting: Oncology

## 2010-10-05 DIAGNOSIS — C3490 Malignant neoplasm of unspecified part of unspecified bronchus or lung: Secondary | ICD-10-CM

## 2010-10-06 ENCOUNTER — Other Ambulatory Visit: Payer: Self-pay | Admitting: Oncology

## 2010-10-06 DIAGNOSIS — C3490 Malignant neoplasm of unspecified part of unspecified bronchus or lung: Secondary | ICD-10-CM

## 2010-10-07 ENCOUNTER — Encounter: Payer: Self-pay | Admitting: Oncology

## 2010-10-16 NOTE — Letter (Signed)
[facility]   Imported By: Sherian Rein 03/28/2010 13:25:23  _____________________________________________________________________  External Attachment:    Type:   Image     Comment:   External Document

## 2010-10-16 NOTE — Letter (Signed)
Summary: Previsit letter  [facility]  [address] [place], [place] [address]   Phone: [phone]  Fax: [phone]       01/18/2010 MRN: [id number]  [name] [name] [address] [place], [place]  [address]  Dear Mr. Warning,  Welcome to the Gastroenterology Division at [facility].    You are scheduled to see a nurse for your pre-procedure visit on 02-02-10 at 3:30pm on the 3rd floor at [facility], [address]  We ask that you try to arrive at our office 15 minutes prior to your appointment time to allow for check-in.  Your nurse visit will consist of discussing your medical and surgical history, your immediate family medical history, and your medications.    Please bring a complete list of all your medications or, if you prefer, bring the medication bottles and we will list them.  We will need to be aware of both prescribed and over the counter drugs.  We will need to know exact dosage information as well.  If you are on blood thinners (Coumadin, Plavix, Aggrenox, Ticlid, etc.) please call our office today/prior to your appointment, as we need to consult with your physician about holding your medication.   Please be prepared to read and sign documents such as consent forms, a financial agreement, and acknowledgement forms.  If necessary, and with your consent, a friend or relative is welcome to sit-in on the nurse visit with you.  Please bring your insurance card so that we may make a copy of it.  If your insurance requires a referral to see a specialist, please bring your referral form from your primary care physician.  No co-pay is required for this nurse visit.     If you cannot keep your appointment, please call [phone] to cancel or reschedule prior to your appointment date.  This allows [place] the opportunity to schedule an appointment for another patient in need of care.    Thank you for choosing [facility] for your medical  needs.  We appreciate the opportunity to care for you.  Please visit [place] at our website  to learn more about our practice.                     Sincerely.                                                                                                                   The Gastroenterology Division

## 2010-10-16 NOTE — Letter (Signed)
Summary: [name] Instructions  [facility]  [address] [place], [place] [address]   Phone: [phone]  Fax: [phone]       [name] [name]    1946-01-07    MRN: [id number]        Procedure Day [name]:  [name]  02/16/10     Arrival Time:  10:30AM     Procedure Time:  11:30AM     Location of Procedure:                    _ X_  [facility] (4th Floor)                       PREPARATION FOR COLONOSCOPY WITH MOVIPREP   Starting 5 days prior to your procedure 02/11/10 do not eat nuts, seeds, popcorn, corn, beans, peas,  salads, or any raw vegetables.  Do not take any fiber supplements (e.g. Metamucil, Citrucel, and Benefiber).  THE DAY BEFORE YOUR PROCEDURE         DATE: 02/15/10  DAY: THURSDAY  1.  Drink clear liquids the entire day-NO SOLID FOOD  2.  Do not drink anything colored red or purple.  Avoid juices with pulp.  No orange juice.  3.  Drink at least 64 oz. (8 glasses) of fluid/clear liquids during the day to prevent dehydration and help the prep work efficiently.  CLEAR LIQUIDS INCLUDE: Water Jello Ice Popsicles Tea (sugar ok, no milk/cream) Powdered fruit flavored drinks Coffee (sugar ok, no milk/cream) Gatorade Juice: apple, white grape, white cranberry  Lemonade Clear bullion, consomm, broth Carbonated beverages (any kind) Strained chicken noodle soup Hard Candy                             4.  In the morning, mix first dose of MoviPrep solution:    Empty 1 Pouch A and 1 Pouch B into the disposable container    Add lukewarm drinking water to the top line of the container. Mix to dissolve    Refrigerate (mixed solution should be used within 24 hrs)  5.  Begin drinking the prep at 5:00 p.m. The MoviPrep container is divided by 4 marks.   Every 15 minutes drink the solution down to the next mark (approximately 8 oz) until the full liter is complete.   6.  Follow completed prep with 16 oz of clear liquid of your choice  (Nothing red or purple).  Continue to drink clear liquids until bedtime.  7.  Before going to bed, mix second dose of MoviPrep solution:    Empty 1 Pouch A and 1 Pouch B into the disposable container    Add lukewarm drinking water to the top line of the container. Mix to dissolve    Refrigerate  THE DAY OF YOUR PROCEDURE      DATE: 02/16/10  DAY: FRIDAY  Beginning at 6:30AM (5 hours before procedure):         1. Every 15 minutes, drink the solution down to the next mark (approx 8 oz) until the full liter is complete.  2. Follow completed prep with 16 oz. of clear liquid of your choice.    3. You may drink clear liquids until 9:30AM (2 HOURS BEFORE PROCEDURE).   MEDICATION INSTRUCTIONS  Unless otherwise instructed, you should take regular prescription medications with a small sip of water   as early as possible the morning of  your procedure.   Additional medication instructions: n/a         OTHER INSTRUCTIONS  You will need a responsible adult at least 65 years of age to accompany you and drive you home.   This person must remain in the waiting room during your procedure.  Wear loose fitting clothing that is easily removed.  Leave jewelry and other valuables at home.  However, you may wish to bring a book to read or  an iPod/MP3 player to listen to music as you wait for your procedure to start.  Remove all body piercing jewelry and leave at home.  Total time from sign-in until discharge is approximately 2-3 hours.  You should go home directly after your procedure and rest.  You can resume normal activities the  day after your procedure.  The day of your procedure you should not:   Drive   Make legal decisions   Operate machinery   Drink alcohol   Return to work  You will receive specific instructions about eating, activities and medications before you leave.    The above instructions have been reviewed and explained to me by   Sherren Kerns RN  Feb 02, 2010 3:53 PM     I fully understand and can verbalize these instructions _____________________________ Date _________

## 2010-10-16 NOTE — Procedures (Signed)
Summary: Colonoscopy  Patient: [name] [name] Note: All result statuses are Final unless otherwise noted.  Tests: (1) Colonoscopy (COL)   COL Colonoscopy           DONE     [facility]     [address]     [place], [place]  [address]           COLONOSCOPY PROCEDURE REPORT           PATIENT:  [name], [name]  MR#:  [id number]     BIRTHDATE:  Aug 19, 1946, 64 yrs. old  GENDER:  male     ENDOSCOPIST:  Judie Petit T. Russella Dar, MD, [facility]           PROCEDURE DATE:  02/16/2010     PROCEDURE:  Colonoscopy [address]     ASA CLASS:  Class II     INDICATIONS:  1) follow-up of cancer, T3, N0, 05/2002.     MEDICATIONS:   Fentanyl 75 mcg IV, Versed 8 mg IV     DESCRIPTION OF PROCEDURE:   After the risks benefits and     alternatives of the procedure were thoroughly explained, informed     consent was obtained.  Digital rectal exam was performed and     revealed no abnormalities.   The LB PCF-H180AL [id number] endoscope     was introduced through the anus and advanced to the cecum, which     was identified by both the appendix and ileocecal valve, without     limitations.  The quality of the prep was excellent, using     MoviPrep.  The instrument was then slowly withdrawn as the colon     was fully examined.     <<PROCEDUREIMAGES>>     FINDINGS:  Moderate diverticulosis was found transverse to sigmoid     The right colon was surgically resected and an ileo-colonic     anastamosis was seen. There was a normal ileo-colonic anastamosis.     This was otherwise a normal examination of the colon. Retroflexed     views in the rectum revealed no abnormalities. The time to cecum =     2.75  minutes. The scope was then withdrawn (time =  8.67  min)     from the patient and the procedure completed.           COMPLICATIONS:  None     ENDOSCOPIC IMPRESSION:     1) Moderate diverticulosis in the transverse to sigmoid     2) Prior right hemi-colectomy     3) Normal anastamosis            RECOMMENDATIONS:     1) High fiber diet with liberal fluid intake.     2) Repeat Colonoscopy in 3 years.           Venita Lick. Russella Dar, MD, [facility]           CC: Pierce Crane, MD           n.     Rosalie DoctorVenita Lick. Gustaf Mccarter at 02/16/2010 12:11 PM           [name] [name], [name] [name], [name] [name]  Note: An exclamation mark (!) indicates a result that was not dispersed into the flowsheet. Document Creation Date: 02/16/2010 12:11 PM _______________________________________________________________________  (1) Order result status: Final Collection or observation date-time: 02/16/2010 12:05 Requested date-time:  Receipt date-time:  Reported date-time:  Referring Physician:   Ordering Physician: Claudette Head [phone]) Specimen Source:  Source: EndoProS  Filler Order Number: [id number] Lab site:   Appended Document: Colonoscopy    Clinical Lists Changes  Observations: Added new observation of COLONNXTDUE: 02/2013 (02/16/2010 14:14)

## 2010-10-16 NOTE — Progress Notes (Signed)
Summary: ? re prep   Phone Note Call from Patient   Caller: Spouse Call For: Russella Dar Reason for Call: Talk to Nurse Summary of Call: Patient has questions regarding prep instructions Initial call taken by: Tawni Levy,  Feb 13, 2010 9:15 AM  Follow-up for Phone Call        answered questions about seeds in prep instructions.  Follow-up by: Christie Nottingham CMA Duncan Dull),  Feb 13, 2010 9:40 AM

## 2010-10-16 NOTE — Miscellaneous (Signed)
Summary: previsit/rm  Clinical Lists Changes  Medications: Added new medication of MOVIPREP 100 GM  SOLR (PEG-KCL-NACL-NASULF-NA ASC-C) As per prep instructions. - Signed Rx of MOVIPREP 100 GM  SOLR (PEG-KCL-NACL-NASULF-NA ASC-C) As per prep instructions.;  #1 x 0;  Signed;  Entered by: Sherren Kerns RN;  Authorized by: Meryl Dare MD [facility];  Method used: Electronically to [facility] [address] [place], [place], [place]  [address], Ph: [phone], Fax: [phone] Allergies: Added new allergy or adverse reaction of BENADRYL Added new allergy or adverse reaction of ASA Observations: Added new observation of ALLERGY REV: Done (02/02/2010 15:16) Added new observation of NKA: F (02/02/2010 15:16)    Prescriptions: MOVIPREP 100 GM  SOLR (PEG-KCL-NACL-NASULF-NA ASC-C) As per prep instructions.  #1 x 0   Entered by:   Sherren Kerns RN   Authorized by:   Meryl Dare MD [facility]   Signed by:   Sherren Kerns RN on 02/02/2010   Method used:   Electronically to        [facility] (retail)       [address]       [place], [place]  [address]       Ph: [phone]       Fax: [phone]   RxID:   [id number]

## 2010-11-26 LAB — GLUCOSE, CAPILLARY: Glucose-Capillary: 107 mg/dL — ABNORMAL HIGH (ref 70–99)

## 2010-12-03 LAB — GLUCOSE, CAPILLARY: Glucose-Capillary: 114 mg/dL — ABNORMAL HIGH (ref 70–99)

## 2011-01-29 NOTE — Assessment & Plan Note (Signed)
OFFICE VISIT   [name], [name]  DOB:  1945/12/14                                        Jan 27, 2008  CHART #:  [id number]   The patient is status post right video-assisted thoracoscopic surgery  with mini-thoracotomy, right middle lobectomy, node dissection and  resection of apical phleb on August 20, 2007 by Dr. Edwyna [name].  The  patient underwent external beam radiation.  He was last seen in the  office March of 2009.   He returns, today, for followup visit following PET scan as well as CT  scan.  The patient states he saw Dr. Donnie [name] last week, and was told  everything was going well.  The patient complains of persistent right-  sided chest pain as well as states skin is very sensitive.  He still  takes the hydrocodone as needed.  He also feels that his digestion was  not working properly and he had some discomfort when he was using  Carafate and during external beam radiation, and this had been taking  care of all of his symptoms.  He is questioning if he could resume the  Carafate.  The patient states he still develops shortness of breath with  ambulation.   PHYSICAL EXAM:  VITAL SIGNS:  Blood pressure 111/76, pulse 96,  respirations 18, O2 saturation 94% on room air.  RESPIRATORY:  Clear to auscultation bilaterally.  CARDIAC:  Regular rate and rhythm.  All incisions are healed.   STUDIES:  The patient had CT scan done Jan 18, 2008 showing no specific  features that suggest residual recurrence of tumor.  He also had CT  abdomen and pelvis which show no metastatic disease.  The patient also  had a PET scan done which is stable.   IMPRESSION AND PLAN:  [name] [name] is progressing well.  Due to the  patient consistent right-sided chest pain, we discussed continuous use  of hydrocodone as needed.  The patient states he has some at home,  currently, and states he will call in if needed for refill.  This is  okay for the patient to call in for refills  of hydrocodone, currently.   The patient was seen and evaluated by Dr. Edwyna [name].  Dr. Edwyna [name] discussed  with the patient that it was okay to take Carafate for his indigestion  symptoms, but told that it may or may not help.   PLAN:  See the patient in 4-5 months following his repeat CT scan, and  follow up with Dr. Donnie [name].  The patient is in agreement.   [name] [name], M.D.  Electronically Signed   KMD/MEDQ  D:  01/27/2008  T:  01/27/2008  Job:  [id number]   cc:   [name] [name], M.D.  [name] Crane, MD

## 2011-01-29 NOTE — Op Note (Signed)
NAME:  MAVIN, DYKE NO.:  [id number]   MEDICAL RECORD NO.:  [id number]          PATIENT TYPE:  INP   LOCATION:  2550                         FACILITY:  [facility]   PHYSICIAN:  Ines Bloomer, M.D. DATE OF BIRTH:  May 17, 1946   DATE OF PROCEDURE:  08/20/2007  DATE OF DISCHARGE:                               OPERATIVE REPORT   PREOPERATIVE DIAGNOSIS:  Right middle lobe clinical stage IIIA non-small  cell lung cancer, status post radiation and chemotherapy.   POSTOPERATIVE DIAGNOSIS:  Right middle lobe clinical stage IIIA non-  small cell lung cancer, status post radiation and chemotherapy.   OPERATION PERFORMED:  Right video-assisted thoracoscopic surgery,  minithoracotomy, right middle lobectomy with node dissection, and  resection of apical bleb.   SURGEON:  Ines Bloomer, M.D.   FIRST ASSISTANT:  Coral Ceo, P.A.   After percutaneous insertion of all monitoring lines, the patient  underwent general anesthesia, was prepped and draped in the usual  sterile manner.  The patient was turned to the right lateral thoracotomy  position and a dual-lumen tube was inserted.  The right lung was  deflated.  Two trocar sites were made in the anterior and posterior  lines at the seventh intercostal space and a 0-degree scope was inserted  and the area of the patient's previous radiation to his middle lobe was  seen.  Then at the level of the fifth intercostal space anteriorly we  did a 6-7-cm incision.  Dissection was carried down through the  subcutaneous tissue and splitting the serratus, and a small Tuffier was  placed in the intercostal space.  We started dissection on the right  middle lobe, dissecting down the fissure, dissecting out the inferior  portion of this fissure, where there were several nodes, 11R nodes and  10R nodes, which were dissected free and then the inferior portion of  the stapler was divided with an Autosuture 45 green stapler.  This  exposed the two branches of the vein to the middle lobe and these were  divided with an Autosuture gray 2-mm stapler.  Next the bronchus, which  was dissected out after dissecting out several more 10R lymph nodes, and  the bronchus was stapled with the 30-mm blue stapler.  The patient had  two branches to the middle lobe of arterial branches.  A smaller one was  ligated with 2-0 silk proximally and distally and clipped and divided,  and the larger one was stapled and divided with the Autosuture 30  stapler.  We then divided the minor fissure with the Autosuture 60  stapler in two applications.  The right middle lobe was removed.  The  area was checked for air leak and no bleeding was found.  We then  explored the subcarinal area and removed several subcarinal lymph nodes  and then the mediastinal lymph nodes, 4R nodes, were also removed.  The  patient had adhesions and a bleb to the right upper lobe and we removed  that with the Autosuture 60 stapler, removing the bleb, using the blue  stapler.  Two chest tubes were brought into the trocar sites.  The chest  was closed with three pericostals, drilling through the sixth rib and  passing around the fifth  rib, #1 Vicryl in the muscle layer, 2-0 Vicryl in the subcutaneous  tissue and Dermabond for the skin.  A single On-Q was inserted in the  usual fashion and a Marcaine block was done in the usual fashion.  The  patient was returned to the recovery room in stable condition.      Ines Bloomer, M.D.  Electronically Signed     DPB/MEDQ  D:  08/20/2007  T:  08/20/2007  Job:  [id number]

## 2011-01-29 NOTE — Letter (Signed)
Jan 31, 2009   Pierce Crane, MD  [address]  [place], [place] [address]   Re:  GURPREET, MIKHAIL                  DOB:  Jan 29, 1946   Dear [name],   I saw the patient in followup today.  His latest followup for his colon  and lung cancer were all completely negative.  He is doing well overall  over 2 years since right middle lobectomy.  I will refer him back to you  for long-term followup.  I would be happy to see him again if he has any  future pulmonary problems.   Ines Bloomer, M.D.  Electronically Signed   DPB/MEDQ  D:  01/31/2009  T:  02/01/2009  Job:  [id number]

## 2011-01-29 NOTE — Letter (Signed)
March 26, 2007   Pierce Crane, M.D.  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  His pulmonary function tests are satisfactory, so if he gets a  good response to radiation chemotherapy, I would recommended that he be  considered for a right middle lobectomy with node dissection.  I will be  happy to see him again after he has completed his therapy.   Ines Bloomer, M.D.  Electronically Signed   DPB/MEDQ  D:  03/26/2007  T:  03/27/2007  Job:  [id number]

## 2011-01-29 NOTE — Letter (Signed)
August 05, 2007   Pierce Crane, MD  [address]  [place], [place] [address]   Re:  [name] [name], SALINGER                  DOB:  May 15, 1946   Dear [name] [name]:   I saw [name] [name] back in the office today. He has got an excellent  response to radiation and chemotherapy. His PET scan was negative and  there was just very little residual in his right middle lobe.   I recommend that he undergo a right middle lobectomy with no dissection  and we have tentatively scheduled this for December 4th at [facility]. I will let you know what our findings are.   I appreciate the opportunity of seeing [name] [name].   Ines Bloomer, M.D.  Electronically Signed   DPB/MEDQ  D:  08/05/2007  T:  08/06/2007  Job:  [id number]

## 2011-01-29 NOTE — Discharge Summary (Signed)
NAME:  [name] [name], ROPP NO.:  [id number]   MEDICAL RECORD NO.:  [id number]          PATIENT TYPE:  INP   LOCATION:  3313                         FACILITY:  [facility]   PHYSICIAN:  Ines Bloomer, M.D. DATE OF BIRTH:  [birth year]   DATE OF ADMISSION:  08/20/2007  DATE OF DISCHARGE:  08/23/2007                               DISCHARGE SUMMARY   HISTORY OF PRESENT ILLNESS:  The patient is a 65 year old male referred  to Dr. Edwyna Shell for thoracic surgical consultation.  The patient has a  known history of colon cancer diagnosed approximately 5 years ago.  In  the interim he was also diagnosed with severe rheumatoid arthritis and  has been receiving Enbrel, methotrexate and Forteo.  He has a history of  tobacco abuse and smoked approximately one pack per day.  His colon  cancer was a stage II and he received five cycles of leucovorin and 5-FU  and also a partial colectomy.  Recently he was found to have a right  upper lobe lung lesion with hilar node adenopathy.  A PET scan was  positive in the right middle lobe, hilar node, and questionable two  lesions in the pelvis.  He underwent a biopsy of the right middle lobe  and this revealed a squamous cell carcinoma and due to the PET scan  findings, this was felt to be a IIA or IIB staging.  He subsequently  underwent irradiation and chemotherapy with an excellent response and a  repeat PET and CT scan showed low residual of the right middle lobe and  no uptake in his lymph nodes.  He was referred to Dr. Edwyna Shell for  surgical resection and lymph node dissection.  He was admitted this  hospitalization for the procedure.   MEDICATIONS PRIOR TO ADMISSION:  1. Prednisone 4 mg daily number.  2. Cardura 4 mg daily.  3. Prilosec 20 mg daily.   ALLERGIES:  No known drug allergies.   Family history, social history, review of systems and physical exam:  Please see the history and physical done at the time of admission.   PAST MEDICAL  HISTORY:  As noted above.  Also includes osteoporosis and  benign prostatic hyperplasia.   HOSPITAL COURSE:  The patient was admitted and taken to the operating  room on August 20, 2007, and underwent the following procedure:  Right  video-assisted thoracoscopy with mini-thoracotomy and right middle lobe  lobectomy, right upper lobe bleb resection, lymph node dissection.  The  patient tolerated the procedure well, was taken to the surgical  intensive care unit in stable condition.  Postoperative hospital course:  The patient has overall done quite well.  Pathology revealed no residual  malignancy identified.  There were extensive findings in the right  middle lobe specimen of extensive necrosis and fibrosis.  All surgical  margins were negative.  The bulla specimen was remarkable for extensive  emphysematous changes and fibrosis but no evidence of malignancy.   The patient progressed in a standard fashion postoperatively.  All  routine lines, monitors and drainage devices were discontinued in the  standard fashion.  He tolerated  a gradual increase in activity using  standard protocols.  Oxygen was weaned without difficulty.  His overall  status was deemed to be acceptable for discharge upon morning round  reevaluation by Dr. Edwyna Shell on August 23, 2007.   MEDICATIONS ON DISCHARGE:  1. Prednisone 3 mg daily.  2. Doxazosin mesylate 4 mg daily.  3. Prilosec OTC 20 mg daily.  4. Forteo 750 mg injections daily.  5. Calcium plus vitamin D 600 mg daily.  6. Vitamin E 400 units daily.  7. Methotrexate and Enbrel were on hold.  8. Niferex 150 mg daily.  9. Tylox one to two q.4-6h. as needed.   INSTRUCTIONS:  The patient received written instructions regarding  medications, activity, diet, wound care and follow-up.  Follow-up  included a visit to see Dr. Edwyna Shell 1 week post discharge with a chest x-  ray.   FINAL DIAGNOSES:  1. History of squamous cell carcinoma of the right lung, status  post      chemotherapy and radiation, with hospitalization this admission for      a right middle lobe lobectomy as described above.  Pathology      negative for ongoing malignant findings.  2. Rheumatoid arthritis.  3. Hypertension.  4. Osteoporosis.  5. Esophageal reflux.  6. Benign prostatic hyperplasia.  7. History of tobacco abuse.  8. History of colon cancer.      Rowe Clack, P.A.-C.      Ines Bloomer, M.D.  Electronically Signed    WEG/MEDQ  D:  09/11/2007  T:  09/11/2007  Job:  [id number]

## 2011-01-29 NOTE — Letter (Signed)
August 27, 2007   Pierce Crane, MD  [address] [facility]  [place], [place] [address]   Re:  [name], FORTSON                  DOB:  1946-06-12   Dear Dr. Donnie Coffin:   I saw the patient back today for his postoperative visit.  He is doing  remarkably well.  His lungs are clear to auscultation and percussion,  his incision is well healed, he is doing well, overall.  His blood  pressure was 103/67, pulse 92, respirations 18, sats were 93%.  I will  see him back in in 3 weeks with a chest x-ray.   Ines Bloomer, M.D.  Electronically Signed   DPB/MEDQ  D:  08/27/2007  T:  08/28/2007  Job:  [id number]

## 2011-01-29 NOTE — Op Note (Signed)
NAME:  DEMETRIE, BORGE NO.:  [id number]   MEDICAL RECORD NO.:  [id number]          PATIENT TYPE:  AMB   LOCATION:  SDS                          FACILITY:  [facility]   PHYSICIAN:  Ines Bloomer, M.D. DATE OF BIRTH:  1946/01/03   DATE OF PROCEDURE:  03/25/2007  DATE OF DISCHARGE:  03/25/2007                               OPERATIVE REPORT   PREOPERATIVE DIAGNOSIS:  Right middle lobe mass with hilar adenopathy.   POSTOPERATIVE DIAGNOSIS:  Right middle lobe mass with hilar adenopathy.   OPERATION PERFORMED:  Video bronchoscopy.   ANESTHESIA:  General.   SURGEON:  Dr. Patricia Nettle. Burney.   After adequate general anesthesia, the video bronchoscope was passed  through the endotracheal tube.  Under fluoro guidance the right middle  lobe was identified and the orifice was passed out the medial segment.  We could see something distally in the medial segment but nothing we  could make for sure that was cancer.  We did brushing and transbronchial  biopsies under fluoro guidance from this area and then examined rest of  tracheobronchial tree.  The right upper lobe and right middle lobe,  right lower lobe was normal.  The left mainstem, left upper lobe and  left lower lobe normal.  Carina was in the midline.  There was some  narrowing around the bronchus intermedius but nothing that we thought we  could stick a Wang needle into, so we removed the video bronchoscope.  The patient tolerated procedure well was turned to the recovery room in  stable condition.      Ines Bloomer, M.D.  Electronically Signed     DPB/MEDQ  D:  03/26/2007  T:  03/27/2007  Job:  [id number]

## 2011-01-29 NOTE — Assessment & Plan Note (Signed)
OFFICE VISIT   LOT, MEDFORD  DOB:  08-26-1946                                        October 04, 2008  CHART #:  [id number]   The patient came for followup today.  His recent CT scan and PET scan  were negative for recurrence of either of his chest and his lung cancer  as well as his colon cancer.  He has another scan in late May and we  will see him back again at that time.  His blood pressure is 104/70,  pulse 100, respirations 18, and sats were 97%.  I will see him back  again at that time.   Ines Bloomer, M.D.  Electronically Signed   DPB/MEDQ  D:  10/04/2008  T:  10/05/2008  Job:  [id number]

## 2011-01-29 NOTE — Letter (Signed)
March 18, 2007   [name] [name], M.D.  [address]  [place], [place] [address]   Re:  [name] [name], BARTOLETTI                  DOB:  [birth year]   Dear [name] [name]:   I saw this patient for followup today.  This 65 year old patient had a  history of colon cancer 5 years ago and has been under followup.  He  also has a history of severe rheumatoid arthritis.  Is on Enbrel and  Forteo.  He has a long history of tobacco abuse, smoking at least a pack  a day for 45 years.  His colon cancer was a stage II, for which he got 5  cycles of leucovorin and 5FU after a partial colectomy in 2003.  He also  has osteoporosis and benign prostatic hypertrophy.  On follow up CT scan  he was found to have a right upper lobe lesion with a hilar node.  PET  scan shows positive in the right middle lobe and the hilar node, as well  as 2 questionable lesions in the pelvis.  He is referred here for  evaluation.  He has had no hemoptysis, fever, chills, excessive sputum.  His pulmonary function test shows an FVC of 4.07 with an FEV1 of 3.73.   MEDICATIONS:  1. Prednisone 4 mg daily.  2. Cardura 4 mg daily.  3. Prilosec 20 mg daily.  4. Forteo injections 750 mcg daily.  5. Methotrexate 15 mg weekly.  6. Enbrel injections 50 mg weekly.   He has no allergies.   FAMILY HISTORY:  Positive for cancer and there has been esophageal  cancer in the family, as well as breast cancer.   SOCIAL HISTORY:  He is married and has 2 children.  He is retired.  He  smokes up to 2 packs a day.  He is trying to quit.  Drinks alcohol  occasionally.   REVIEW OF SYSTEMS:  He is 185 pounds, he is 5 feet 9 inches.  Cardiac  with no angina or atrial fibrillation.  Pulmonary, see history of  present illness.  GI No nausea, vomiting, diarrhea, or constipation.  GU  no dysuria or frequent urination.  Vascular with no claudication, DVT,  or TIA.  Neurological, no dizziness, headaches, black-outs, or seizures.  Musculoskeletal, see history of  present illness.  Psychiatric, no  psychiatric illness.  ENT no changes in eyesight or hearing.  No  problems with bleeding.   PHYSICAL EXAM:  He is a well-developed Caucasian male in no acute  distress.  HEAD, EARS, EYES, NOSE, AND THROAT:  Unremarkable.  CHEST:  Clear to auscultation and percussion.  HEART:  Regular sinus rhythm.  No murmur.  ABDOMEN:  Soft.  There is no hepatosplenomegaly.  Pulses are 2+.  There is no clubbing or edema.  NEUROLOGIC:  He is oriented x3.  Sensory and motor intact.  Cranial  nerves are intact.   Unfortunately, I think the patient has at least a stage II if not a  stage IV lung cancer.  The hilar lymph node can only be obtained by an  open VATS biopsy, so I feel the first thing to do is to do a  bronchoscopy to see if we can get a diagnosis.  I plan to do this on the  ninth of July at [facility].  His blood pressure was 112/71, pulse  91, respirations 18, sats were 98%.  [name] [name], M.D.  Electronically Signed   DPB/MEDQ  D:  03/18/2007  T:  03/19/2007  Job:  [id number]

## 2011-01-29 NOTE — Letter (Signed)
June 22, 2008   Pierce Crane, MD  [address] [place], [place] [address]   Re:  [name] [name], FLEER                  DOB:  1946/08/13   Dear [name],   I saw the patient back today and reviewed his CT scans from mid  September.  The CT of the chest showed no evidence of recurrence.  He  had a previous middle lobectomy and there was evidence of some reaction  there, but overall, I think he is doing well and I will see him back  again in 4 months since January for his next CT scan.  I appreciate the  opportunity of seeing the patient.   Ines Bloomer, M.D.  Electronically Signed   DPB/MEDQ  D:  06/22/2008  T:  06/23/2008  Job:  [id number]

## 2011-01-29 NOTE — Assessment & Plan Note (Signed)
OFFICE VISIT   KALADIN, NOSEWORTHY  DOB:  1945/12/03                                        October 13, 2007  CHART #:  [id number]   Followup today.  Chest x-ray showed reaction in his right hilum from his  surgery, as well as his radiation.  His incisions are well healed.  He  is having a mild amount of pain.  Told him we would be happy to refill  his hydrocodone at any time.  His blood pressure was 106/78, pulse 100,  respirations 18, sats 92%.  I told him to gradually increase his  activity and I would see him back again in 2 months with chest x-ray.   Ines Bloomer, M.D.  Electronically Signed   DPB/MEDQ  D:  10/13/2007  T:  10/14/2007  Job:  [id number]

## 2011-01-29 NOTE — Assessment & Plan Note (Signed)
OFFICE VISIT   OLLIVANDER, SEE  DOB:  [birth year]                                        September 15, 2007  CHART #:  [id number]   Mr. Lupo came for follow-up today.  Blood pressure was 128/83, pulse  100, respirations 18, sats were 96%.  Incisions were well healed.  Chest  x-ray showed normal postoperative changes after his right middle  lobectomy and no dissection so we told him to increase his activities  and gave him a refill for Tylox.  I will see him back again in 4 weeks  with a chest x-ray.   Ines Bloomer, M.D.  Electronically Signed   DPB/MEDQ  D:  09/15/2007  T:  09/15/2007  Job:  [id number]

## 2011-01-29 NOTE — H&P (Signed)
NAME:  [name] [name], [name] [name] NO.:  [id number]   MEDICAL RECORD NO.:  [id number]          PATIENT TYPE:  INP   LOCATION:  NA                           FACILITY:  [facility]   PHYSICIAN:  Ines Bloomer, M.D. DATE OF BIRTH:  [birth year]   DATE OF ADMISSION:  DATE OF DISCHARGE:                              HISTORY & PHYSICAL   HISTORY OF PRESENT ILLNESS:  This 65 year old patient was found to have  colon cancer 5 years ago.  Then in follow up was diagnosed with severe  rheumatoid arthritis, which he has been on Enbrel injections and  methotrexate and Forteo.  He has a long history of tobacco abuse and  smoking at least one pack a day.  His colon cancer was stage II and he  had 5 cycles of leucovorin and 5FU and a partial colectomy.  He also has  osteoporosis and benign prostatic hypertrophy.  He was found to have a  right upper lobe lesion with a hilar node.  A PET scan was positive in  the right middle lobe, hilar node, and questionable 2 lesions in the  pelvis.  His pulmonary function test showed FVC of 4.07 and an FEV1 of  3.23.  He underwent biopsy of his right middle lobe and turned out to be  squamous cell cancer and it was thought the PET scan to be a 2A or 2B.  He underwent radiation and chemotherapy with an excellent response and  returned today where a PET scan and CT scan showed marked low residual  in the right middle lobe and no uptake on his nodes.  He was referred  here for a right middle lobectomy and node dissection.   MEDICATIONS:  Include:  1. Prednisone 4 mg daily.  2. Cardura 4 mg daily.  3. Prilosec 20 mg daily.  4. Enbrel, Forteo, and methotrexate has been stopped.   ALLERGIES:  HE HAS NO ALLERGIES.   FAMILY HISTORY:  Positive for esophageal cancer and breast cancer.   SOCIAL HISTORY:  He is married.  Has 2 children.  He is retired.  He  smokes 2 packs a day, but has quit.  He does not drink alcohol on a  regular basis.   REVIEW OF SYSTEMS:  He  is 185 pounds.  He is 5 feet, 9 inches.  CARDIAC:  No angina or atrial fibrillation.  PULMONARY:  See history of present  illness.  GI:  No nausea, vomiting, constipation, or diarrhea.  GU:  No  dysuria, frequent urination.  VASCULAR:  No claudication, DVT, TIAs.  NEUROLOGIC:  No headaches, blackout, or seizures.  MUSCULOSKELETAL:  See  history of present illness.  PSYCHIATRIC:  No psychiatric symptoms.  EYE/ENT:  No change in his eye sight and hearing.  HEMATOLOGIC:  No  problems with bleeding or clotting disorders.   PHYSICAL EXAMINATION:  GENERAL:  He is a well-developed, Caucasian male  in no acute distress.  VITAL SIGNS:  Blood pressure 127/81, pulse 100, respirations 18,  saturations 94%.  HEAD:  Atraumatic.  EYES:  Pupils equal, round, reactive to light and accommodation.  Extraocular movements are  normal.  EARS:  Tympanic membranes intact.  NOSE:  No septal deviation.  MOUTH:  Without lesion.  NECK:  Supple without thyromegaly.  There is no supraclavicular or axial  adenopathy.  CHEST:  Clear to auscultation and percussion.  HEART:  Regular sinus rhythm.  No murmurs.  ABDOMEN:  Soft.  No surgical scars.  Bowel sounds are normal.  EXTREMITIES:  Pulses are 2+.  No sudden joint changes in his hand.  NEURO:  He is oriented x3.  Sensory and motor intact.  Cranial nerves  intact.   IMPRESSION:  1. Stage II B nonsmall cell lung cancer right middle lobe.  2. Status post radiation chemotherapy.  3. Rheumatoid arthritis.  4. History of colon cancer.  5. Hypertension.  6. Benign prostatic hypertrophy.  7. Gastroesophageal reflux.   PLAN:  Right thoracotomy, right middle lobectomy with node dissection.      Ines Bloomer, M.D.  Electronically Signed     DPB/MEDQ  D:  08/18/2007  T:  08/19/2007  Job:  [id number]

## 2011-01-29 NOTE — Letter (Signed)
December 09, 2007   Pierce Crane, MD  [address] [facility]  [place], [place] [address]   Re:  DARON, STUTZ                  DOB:  [birth year]   Dear [name],   I saw Mr. Dacquisto back today.  He is still having a moderate amount of  chest pain from his surgery.  We told him we would continue to refill  his pain medication.  Chest x-ray still showed a lot of reaction right  in the mid portion where the middle lobe was.  As you know, he had a lot  of radiation fibrosis in this area, although his findings were all  negative.  He is scheduled for another PET CT in May.  I will see him  back again at that time.   His blood pressure was 118/83, pulse 100, respirations 18, sats 90%.   Again, hopefully his pain is improving, but he may require it for a few  more months.  We will provide him pain medications for a few more  months.   Ines Bloomer, M.D.  Electronically Signed   DPB/MEDQ  D:  12/09/2007  T:  12/09/2007  Job:  [id number]   cc:   Artist Pais Kathrynn Running, M.D.

## 2011-02-01 NOTE — H&P (Signed)
[address] [facility]  Patient:    [name] [name], [name] [name]. Visit Number: [id number] MRN: [id number]          Service Type: Attending:  Izell [place]. Elesa Hacker, M.D. Dictated by:   Izell  Elesa Hacker, M.D. Adm. Date:  08/25/01                           History and Physical  HISTORY OF PRESENT ILLNESS:  Mr. [name] [name] is a 65 year old male who recently retired from the [employer] only about five weeks or so ago. He presents with a chief complaint of pain involving his low back, left hip, and leg which has gone on for some seven to eight weeks.  He has considerably more leg pain than he does back pain.  The right leg is uninvolved and he has had no difficulty with bowel and bladder control.  He notes that any kind of physical activity increases the amount of left leg pain he has, particularly weightbearing or walking.  He notes numbness and tingling going down his leg all the way to his foot.  He recently had an MRI study which demonstrated a quite large disk rupture at L4-5 and he is admitted at this time for surgical decompression of that lesion.  PAST MEDICAL HISTORY:  Indicates he has had a bleeding ulcer about 30 years ago but no trouble since.  He has rheumatoid arthritis and is being treated by Dr. Gavin Potters for that, and is now taking some medications for that.  FAMILY HISTORY:  His mother died at age 31 of a stroke and diabetes.  His father is living at age 65 with high blood pressure, heart disease, and he also has had a stroke.  He has a 44 year old brother who has had cardiac bypass surgery.  He has a 29 year old brother living who has cancer.  He has three sisters - two of whom are living and well, one of whom has cancer.  He has two children age 55 and 82 living and well.  PREVIOUS SURGERY:  He had surgery for his bleeding ulcer in 1974.  CURRENT MEDICATIONS:  Prednisone 5 mg every day - he has been on it for 10 years.  He takes Cardura,  Prilosec, Enbrel, methotrexate, and Fosamax.  ALLERGIES:  He knows of no medical allergies.  PERSONAL HISTORY:  He smokes two packs of cigarettes a day and drinks alcohol moderately.  He is married.  REVIEW OF SYSTEMS:  Shows some aches and pains in multiple joints consistent with a diagnosis of rheumatoid arthritis.  PHYSICAL EXAMINATION:  GENERAL:  He is alert and cooperative.  VITAL SIGNS:  He weighs 212 pounds at 5 feet 9 inches tall with a blood pressure of 120/80 and a pulse of 76.  His temperature is 97.1.  HEENT:  Within normal limits.  NECK:  He has no cervical adenopathy and no cervical or supraclavicular bruits.  CHEST:  Clear.  CARDIAC:  Reveals no murmurs or enlargement.  ABDOMEN:  Soft and nontender.  He has a well-healed midline abdominal incision related to his history of bleeding ulcer.  BACK AND SPINE:  Reveals limitation of range of motion of forward bending and tilting to the left, which increases his leg pain.  He has no palpable adenopathy or mass lesions in his back, buttocks, or legs.  He is able to walk on heel and toe but he has some dorsiflexor weakness on the left side that  involves the foot and great toe.  He has diminished sensation over an L5 distribution on the left side.  Deep tendon reflexes were somewhat hypoactive throughout.  LABORATORY DATA:  I did review his lumbar MRI, would agree he has a large disk rupture at L4-5.  I went over this in detail with him and with his wife.  IMPRESSION:  Ruptured disk, central and left, L4-5.  PLAN:  He is admitted for surgical decompression.  He understands the risks including the risk of bleeding, infection, damage to the common dural tube with CSF leak, potential damage to the lumbar nerve roots which could produce weakness and sensory deficit. Dictated by:   Izell [place] Elesa Hacker, M.D. Attending:  Izell [place]. Elesa Hacker, M.D. DD:  08/24/01 TD:  08/24/01 Job: [id number]

## 2011-02-01 NOTE — Op Note (Signed)
NAME:  [name] [name], [name] [name] NO.:  [id number]   MEDICAL RECORD NO.:  [id number]                   PATIENT TYPE:  INP   LOCATION:  0343                                 FACILITY:  [facility]   PHYSICIAN:  Currie Paris, M.D.           DATE OF BIRTH:  Apr 16, 1946   DATE OF PROCEDURE:  06/09/2002  DATE OF DISCHARGE:                                 OPERATIVE REPORT   OFFICE MEDICAL RECORD NO:  [id number]   PREOPERATIVE DIAGNOSIS:  Carcinoma of ascending colon.   POSTOPERATIVE DIAGNOSIS:  Carcinoma of ascending colon.   OPERATION:  Right hemicolectomy.   SURGEON:  Currie Paris, M.D.   ASSISTANT:  __________ , M.D.   ANESTHESIA:  General endotracheal.   CLINICAL HISTORY:  The patient is a 65 year old who was recently found by  endoscopy to have a benign polyp in the ascending colon, but a sessile  malignant polyp also in the ascending colon near the ileocecal valve.  After  discussion of alternatives with the patient, he elected to proceed to right  colectomy.   DESCRIPTION OF PROCEDURE:  The patient was seen in the holding area and had  no further questions.  He was taken to the operating room and after  satisfactory general endotracheal anesthesia had been obtained, a Foley  catheter was placed and the abdomen was prepped and draped.  Right  transverse incision was made, the fascia opened, the muscle divided and the  posterior sheath picked up and the peritoneal cavity entered.  Brief manual  exam did not readily identify tumor.  The small bowel and colon both grossly  looked normal and there was a lot of redundant colon.  There was some  omental adhesions to the midline from a prior perforated ulcer surgery;  these were taken down.  The liver and gallbladder appeared to be normal.  No  masses palpable around the kidneys, and they appeared to be normal at the  adrenals by palpation.   A wound retractor and self-retaining retractor was placed.   The right colon  was mobilized with electrocautery by dividing most of this with the cautery,  but a few places were tied.  The omentum was taken off of the colon and  divided out and around the level of the colic vessels.  Once we had the  colon mobilized up into the duodenum and were well away from the ureter, we  proceeded to divide the mesentery.   When it was made at the colon mesentery and divided down towards the base.  I likewise started with the small bowel, terminal ileum and divided down  towards the base and truly left with a pedicle of what appeared to be the  right colic vessels, which was sequentially clamped, cut and divided and  tied with double sutures being used for the stay site at the large vessels,  and also suture ligatures.   At this point everything appeared  to be dry.  We irrigated one time to make  sure everything was okay.  The antimesenteric border of the colon and small  bowel were tacked together, at what I thought the end of the anastomosis  could go.  The colon and small bowel were opened with the cautery and the  GIA inserted; we fired to produce a side-to-side anastomosis.  A TA-62  stapler was then used to come across both layers of the small bowel and  colon, closing the common defect and fired.  The colon was taken off and  sent to pathology.  I had been able to palpate the mass as a small sessile  area, about  1.5 cm in size in the area noted on the endoscopy.  This was confirmed by  pathology.   The mesenteric defect was closed with 3-0 silks.  The abdomen was then  irrigated and closed with running #0 PDS and posterior sheath, #1 PDS on the  anterior sheath and staples on the skin.  The patient tolerated the  procedure well.  There were no operative complications.  All counts were  correct.   ESTIMATED BLOOD LOSS:  Less than 100 cc.                                               Currie Paris, M.D.    CJS/MEDQ  D:  06/09/2002  T:   06/10/2002  Job:  [id number]   cc:   Ulyess Mort, M.D. [facility]

## 2011-02-01 NOTE — Discharge Summary (Signed)
   NAME:  [name] [name], CIHLAR NO.:  [id number]   MEDICAL RECORD NO.:  [id number]                   PATIENT TYPE:  INP   LOCATION:  0343                                 FACILITY:  [facility]   PHYSICIAN:  Currie Paris, M.D.           DATE OF BIRTH:  Jun 20, 1946   DATE OF ADMISSION:  06/09/2002  DATE OF DISCHARGE:  06/14/2002                                 DISCHARGE SUMMARY   OFFICE MEDICAL RECORD NUMBER:  [id number]   FINAL DIAGNOSES:  1. Carcinoma of ascending colon (T3, N0, M0).   HISTORY OF PRESENT ILLNESS:  The patient is a 65 year old gentleman with  some anemia and a work-up showing an ascending colon cancer.   HOSPITAL COURSE:  The patient was admitted on June 09, 2002, and taken  to the operating room where a right hemicolectomy with primary anastomosis  was done.  Postoperatively, he had a fairly benign postoperative course and  was begun on liquids.  Diet gradually advanced until he was tolerating a  solid diet by the fifth postoperative day.  He was having minimal pain and  his abdomen was benign at that point in time and he had been afebrile.  He  was felt able to be discharged on June 14, 2002, and was sent home to  resume his usual home medications, Tylox for pain.  He will follow up later  in the week to get his staples out and then one to two weeks later for  office visit.   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  Zero of 15 nodes were  positive.  There was no evidence of intra-abdominal metastatic disease.  Plans were made for oncology follow-up visit after discharge.   CONDITION ON DISCHARGE:  The patient was discharged in satisfactory  condition.                                                 Currie Paris, M.D.    CJS/MEDQ  D:  06/29/2002  T:  06/29/2002  Job:  [id number]   cc:   Ulyess Mort, M.D. [facility]

## 2011-02-01 NOTE — Op Note (Signed)
[place]. [facility]  Patient:    AJENE, CARCHI Visit Number: [id number] MRN: [id number]          Service Type: DSU Location: 3000 [id number] [id number] Attending Physician:  Jackelyn Knife Dictated by:   Izell [place] Elesa Hacker, M.D. Proc. 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  PROCEDURE:  Left L4-5 hemilaminotomy and diskectomy.  SURGEON:  Izell [place]. Elesa Hacker, M.D.  ASSISTANT:  Clydene Fake, M.D.  DESCRIPTION OF PROCEDURE:  Under general endotracheal anesthesia, the usual subperiosteal approach was carried out to the left L4-5 interlaminar space. The proper operative level was confirmed with intraoperative x-rays.  Utilizing the operating microscope and highspeed drill, a hemilaminotomy was performed.  This was enlarged with Kerrison rongeurs.  The ligamentum flavum was removed and the underlying dural sac and nerve root were identified. Initial efforts at retraction of these structures medially was hampered considerably by an underlying soft tissue mass that proved to be the expected large disk extrusion as seen on the MRI scan.  The protruding disk material was removed piecemeal and this greatly facilitated retraction of the dural sac and nerve root.  The intervertebral disk space itself was emptied of its contents using pituitary rongeurs and ring curets.  A careful search was made in all directions to find additional extruded fragments, but none were found.  The wound was then copiously irrigated with antibiotic solution, after which it was closed in layers.  A sterile dressing was then applied and the patient was taken to the recovery room in good condition. Dictated by:   Izell [place] Elesa Hacker, M.D. Attending Physician:  Jackelyn Knife DD:  08/25/01 TD:  08/25/01 Job:  [id number]

## 2011-02-20 ENCOUNTER — Encounter (HOSPITAL_COMMUNITY): Payer: Self-pay

## 2011-02-20 ENCOUNTER — Other Ambulatory Visit (HOSPITAL_COMMUNITY): Payer: Self-pay

## 2011-02-20 ENCOUNTER — Other Ambulatory Visit: Payer: Self-pay | Admitting: Oncology

## 2011-02-20 ENCOUNTER — Encounter (HOSPITAL_BASED_OUTPATIENT_CLINIC_OR_DEPARTMENT_OTHER): Payer: Medicare Other | Admitting: Oncology

## 2011-02-20 ENCOUNTER — Encounter (HOSPITAL_COMMUNITY)
Admission: RE | Admit: 2011-02-20 | Discharge: 2011-02-20 | Disposition: A | Discharge status: Home / Self Care | Payer: Medicare Other | Source: Ambulatory Visit | Attending: Oncology | Admitting: Oncology

## 2011-02-20 ENCOUNTER — Inpatient Hospital Stay (HOSPITAL_COMMUNITY): Admission: RE | Admit: 2011-02-20 | Payer: Self-pay | Source: Ambulatory Visit

## 2011-02-20 ENCOUNTER — Ambulatory Visit (HOSPITAL_COMMUNITY)
Admission: RE | Admit: 2011-02-20 | Discharge: 2011-02-20 | Disposition: A | Discharge status: Home / Self Care | Payer: Medicare Other | Source: Ambulatory Visit | Attending: Oncology | Admitting: Oncology

## 2011-02-20 DIAGNOSIS — C189 Malignant neoplasm of colon, unspecified: Secondary | ICD-10-CM | POA: Insufficient documentation

## 2011-02-20 DIAGNOSIS — C3490 Malignant neoplasm of unspecified part of unspecified bronchus or lung: Secondary | ICD-10-CM | POA: Insufficient documentation

## 2011-02-20 DIAGNOSIS — K5730 Diverticulosis of large intestine without perforation or abscess without bleeding: Secondary | ICD-10-CM | POA: Insufficient documentation

## 2011-02-20 DIAGNOSIS — E279 Disorder of adrenal gland, unspecified: Secondary | ICD-10-CM | POA: Insufficient documentation

## 2011-02-20 DIAGNOSIS — J438 Other emphysema: Secondary | ICD-10-CM | POA: Insufficient documentation

## 2011-02-20 DIAGNOSIS — N400 Enlarged prostate without lower urinary tract symptoms: Secondary | ICD-10-CM | POA: Insufficient documentation

## 2011-02-20 DIAGNOSIS — M069 Rheumatoid arthritis, unspecified: Secondary | ICD-10-CM

## 2011-02-20 DIAGNOSIS — J479 Bronchiectasis, uncomplicated: Secondary | ICD-10-CM | POA: Insufficient documentation

## 2011-02-20 DIAGNOSIS — Z902 Acquired absence of lung [part of]: Secondary | ICD-10-CM | POA: Insufficient documentation

## 2011-02-20 DIAGNOSIS — C342 Malignant neoplasm of middle lobe, bronchus or lung: Secondary | ICD-10-CM

## 2011-02-20 DIAGNOSIS — C182 Malignant neoplasm of ascending colon: Secondary | ICD-10-CM

## 2011-02-20 HISTORY — DX: Malignant (primary) neoplasm, unspecified: C80.1

## 2011-02-20 LAB — CMP (CANCER CENTER ONLY)
ALT(SGPT): 20 U/L (ref 10–47)
BUN, Bld: 7 mg/dL (ref 7–22)
CO2: 27 mEq/L (ref 18–33)
Creat: 0.7 mg/dl (ref 0.6–1.2)
Total Bilirubin: 1.4 mg/dl (ref 0.20–1.60)

## 2011-02-20 LAB — CBC WITH DIFFERENTIAL/PLATELET
BASO%: 0.2 % (ref 0.0–2.0)
EOS%: 0.5 % (ref 0.0–7.0)
HCT: 43.4 % (ref 38.4–49.9)
LYMPH%: 9.5 % — ABNORMAL LOW (ref 14.0–49.0)
MCH: 32.7 pg (ref 27.2–33.4)
MCHC: 33.9 g/dL (ref 32.0–36.0)
MONO#: 0.5 10*3/uL (ref 0.1–0.9)
NEUT%: 81.5 % — ABNORMAL HIGH (ref 39.0–75.0)
Platelets: 156 10*3/uL (ref 140–400)

## 2011-02-20 LAB — GLUCOSE, CAPILLARY: Glucose-Capillary: 112 mg/dL — ABNORMAL HIGH (ref 70–99)

## 2011-02-20 LAB — FECAL OCCULT BLOOD, GUAIAC: Occult Blood: NEGATIVE

## 2011-02-20 MED ORDER — IOHEXOL 300 MG/ML  SOLN
100.0000 mL | Freq: Once | INTRAMUSCULAR | Status: AC | PRN
  Administered 2011-02-20: 100 mL via INTRAVENOUS

## 2011-02-20 MED ORDER — FLUDEOXYGLUCOSE F - 18 (FDG) INJECTION
18.1000 | Freq: Once | INTRAVENOUS | Status: AC | PRN
  Administered 2011-02-20: 18.1 via INTRAVENOUS

## 2011-02-21 ENCOUNTER — Other Ambulatory Visit (HOSPITAL_COMMUNITY): Payer: Self-pay

## 2011-02-27 ENCOUNTER — Encounter (HOSPITAL_BASED_OUTPATIENT_CLINIC_OR_DEPARTMENT_OTHER): Payer: Medicare Other | Admitting: Oncology

## 2011-02-27 ENCOUNTER — Other Ambulatory Visit: Payer: Self-pay | Admitting: Oncology

## 2011-02-27 DIAGNOSIS — Z85118 Personal history of other malignant neoplasm of bronchus and lung: Secondary | ICD-10-CM

## 2011-02-27 DIAGNOSIS — M069 Rheumatoid arthritis, unspecified: Secondary | ICD-10-CM

## 2011-02-27 DIAGNOSIS — C342 Malignant neoplasm of middle lobe, bronchus or lung: Secondary | ICD-10-CM

## 2011-02-27 DIAGNOSIS — R05 Cough: Secondary | ICD-10-CM

## 2011-02-27 DIAGNOSIS — Z85038 Personal history of other malignant neoplasm of large intestine: Secondary | ICD-10-CM

## 2011-06-24 LAB — TYPE AND SCREEN: Antibody Screen: NEGATIVE

## 2011-06-24 LAB — CBC
HCT: 28.3 — ABNORMAL LOW
HCT: 37.3 — ABNORMAL LOW
Hemoglobin: 10 — ABNORMAL LOW
Hemoglobin: 12.9 — ABNORMAL LOW
MCHC: 34.2
MCHC: 34.6
MCHC: 35
MCV: 105.6 — ABNORMAL HIGH
MCV: 106.6 — ABNORMAL HIGH
Platelets: 151
Platelets: 164
RBC: 2.68 — ABNORMAL LOW
RBC: 2.92 — ABNORMAL LOW
RBC: 3.55 — ABNORMAL LOW
RDW: 13
RDW: 13.3
RDW: 13.4
WBC: 5.5

## 2011-06-24 LAB — COMPREHENSIVE METABOLIC PANEL
ALT: 14
ALT: 15
Albumin: 2.5 — ABNORMAL LOW
Alkaline Phosphatase: 55
BUN: 4 — ABNORMAL LOW
BUN: 4 — ABNORMAL LOW
BUN: 5 — ABNORMAL LOW
CO2: 23
CO2: 27
Calcium: 8 — ABNORMAL LOW
Calcium: 9.5
Chloride: 100
Creatinine, Ser: 0.79
GFR calc non Af Amer: >60
GFR calc non Af Amer: >60
Glucose, Bld: 114 — ABNORMAL HIGH
Glucose, Bld: 116 — ABNORMAL HIGH
Potassium: 4.3
Sodium: 137
Sodium: 137
Total Bilirubin: 1.3 — ABNORMAL HIGH
Total Protein: 4.9 — ABNORMAL LOW
Total Protein: 6.7

## 2011-06-24 LAB — BASIC METABOLIC PANEL
BUN: 4 — ABNORMAL LOW
CO2: 25
Calcium: 8.1 — ABNORMAL LOW
Chloride: 106
Creatinine, Ser: 0.72
Glucose, Bld: 162 — ABNORMAL HIGH

## 2011-06-24 LAB — URINALYSIS, ROUTINE W REFLEX MICROSCOPIC
Bilirubin Urine: NEGATIVE
Glucose, UA: NEGATIVE
Hgb urine dipstick: NEGATIVE
Ketones, ur: NEGATIVE
Protein, ur: NEGATIVE
Urobilinogen, UA: 0.2

## 2011-06-24 LAB — BLOOD GAS, ARTERIAL
Acid-base deficit: 0.4
Acid-base deficit: 0.4
Bicarbonate: 23
O2 Content: 3
O2 Saturation: 97.8
O2 Saturation: 99
Patient temperature: 99.1
pCO2 arterial: 32.9 — ABNORMAL LOW
pO2, Arterial: 111 — ABNORMAL HIGH
pO2, Arterial: 92.9

## 2011-06-24 LAB — PROTIME-INR
INR: 1
Prothrombin Time: 13

## 2011-06-24 LAB — ABO/RH: ABO/RH(D): A POS

## 2011-07-02 LAB — COMPREHENSIVE METABOLIC PANEL
ALT: 13
Alkaline Phosphatase: 56
CO2: 27
Chloride: 104
Glucose, Bld: 103 — ABNORMAL HIGH
Potassium: 3.8
Sodium: 138
Total Protein: 6.4

## 2011-07-02 LAB — CBC
Hemoglobin: 13.4
RBC: 4.34
RDW: 18.8 — ABNORMAL HIGH
WBC: 8.3

## 2011-07-02 LAB — PROTIME-INR: Prothrombin Time: 13.9

## 2011-07-02 LAB — CULTURE, RESPIRATORY W GRAM STAIN

## 2011-07-02 LAB — AFB CULTURE WITH SMEAR (NOT AT ARMC)

## 2011-07-02 LAB — FUNGUS CULTURE W SMEAR

## 2011-07-04 ENCOUNTER — Other Ambulatory Visit: Payer: Self-pay | Admitting: Dermatology

## 2011-08-26 ENCOUNTER — Other Ambulatory Visit: Payer: Self-pay | Admitting: Oncology

## 2011-08-26 ENCOUNTER — Other Ambulatory Visit: Payer: Self-pay | Admitting: *Deleted

## 2011-08-26 ENCOUNTER — Ambulatory Visit (HOSPITAL_COMMUNITY)
Admission: RE | Admit: 2011-08-26 | Discharge: 2011-08-26 | Disposition: A | Discharge status: Home / Self Care | Payer: Medicare Other | Source: Ambulatory Visit | Attending: Oncology | Admitting: Oncology

## 2011-08-26 ENCOUNTER — Other Ambulatory Visit (HOSPITAL_BASED_OUTPATIENT_CLINIC_OR_DEPARTMENT_OTHER): Payer: Medicare Other | Admitting: Lab

## 2011-08-26 DIAGNOSIS — C182 Malignant neoplasm of ascending colon: Secondary | ICD-10-CM

## 2011-08-26 DIAGNOSIS — C189 Malignant neoplasm of colon, unspecified: Secondary | ICD-10-CM | POA: Insufficient documentation

## 2011-08-26 DIAGNOSIS — Z9221 Personal history of antineoplastic chemotherapy: Secondary | ICD-10-CM | POA: Insufficient documentation

## 2011-08-26 DIAGNOSIS — Z902 Acquired absence of lung [part of]: Secondary | ICD-10-CM | POA: Insufficient documentation

## 2011-08-26 DIAGNOSIS — C3490 Malignant neoplasm of unspecified part of unspecified bronchus or lung: Secondary | ICD-10-CM | POA: Insufficient documentation

## 2011-08-26 DIAGNOSIS — Z923 Personal history of irradiation: Secondary | ICD-10-CM | POA: Insufficient documentation

## 2011-08-26 DIAGNOSIS — C342 Malignant neoplasm of middle lobe, bronchus or lung: Secondary | ICD-10-CM

## 2011-08-26 DIAGNOSIS — C801 Malignant (primary) neoplasm, unspecified: Secondary | ICD-10-CM

## 2011-08-26 DIAGNOSIS — Y842 Radiological procedure and radiotherapy as the cause of abnormal reaction of the patient, or of later complication, without mention of misadventure at the time of the procedure: Secondary | ICD-10-CM | POA: Insufficient documentation

## 2011-08-26 DIAGNOSIS — D649 Anemia, unspecified: Secondary | ICD-10-CM

## 2011-08-26 DIAGNOSIS — J479 Bronchiectasis, uncomplicated: Secondary | ICD-10-CM | POA: Insufficient documentation

## 2011-08-26 LAB — BUN & CREATININE (CHCC)
BUN, Bld: 7 mg/dL (ref 7–22)
Creat: 0.7 mg/dl (ref 0.6–1.2)

## 2011-08-26 MED ORDER — IOHEXOL 300 MG/ML  SOLN
100.0000 mL | Freq: Once | INTRAMUSCULAR | Status: AC | PRN
  Administered 2011-08-26: 100 mL via INTRAVENOUS

## 2011-08-29 ENCOUNTER — Telehealth: Payer: Self-pay | Admitting: *Deleted

## 2011-08-29 ENCOUNTER — Ambulatory Visit (HOSPITAL_BASED_OUTPATIENT_CLINIC_OR_DEPARTMENT_OTHER): Payer: Medicare Other | Admitting: Oncology

## 2011-08-29 VITALS — BP 118/78 | HR 106 | Temp 98.0°F | Wt 195.5 lb

## 2011-08-29 DIAGNOSIS — C342 Malignant neoplasm of middle lobe, bronchus or lung: Secondary | ICD-10-CM

## 2011-08-29 DIAGNOSIS — C189 Malignant neoplasm of colon, unspecified: Secondary | ICD-10-CM

## 2011-08-29 DIAGNOSIS — C3490 Malignant neoplasm of unspecified part of unspecified bronchus or lung: Secondary | ICD-10-CM

## 2011-08-29 DIAGNOSIS — Z85038 Personal history of other malignant neoplasm of large intestine: Secondary | ICD-10-CM

## 2011-08-29 NOTE — Patient Instructions (Signed)

## 2011-08-29 NOTE — Telephone Encounter (Signed)
gave patient appointment for 02-2012 with the doctor and ct chest abd pelvis

## 2011-08-29 NOTE — Progress Notes (Signed)
Hematology and Oncology Follow Up Visit  [name] [name] [id number] 1945-09-20 65 y.o. 08/29/2011 3:29 PM PCP  Principle Diagnosis: 65 yo man with hx of stage 2 colon ca s/p chemo 2003; Hx stage 3 lung cancer, 2008, s/p chemo/xrt followed by lobectomy with pCr, performed by dr Edwyna Shell Hx of rheumatoid arthritis , followed by dr Gavin Potters in [place] , currently on arava x 6 mo and prednisone 5 mg.  Interim History:  There have been no intercurrent illness, hospitalizations or medication changes, apart for the arava for rheumatoid , which is causing diarrhea  Medications: I have reviewed the patient's current medications.  Allergies:  Allergies  Allergen Reactions  . Aspirin     REACTION: stomach bleeding ulcers  . Diphenhydramine Hcl     REACTION: restless leg,hyperactivity    Past Medical History, Surgical history, Social history, and Family History were reviewed and updated.  Review of Systems: Constitutional:  Negative for fever, chills, night sweats, anorexia, weight loss, pain. Cardiovascular: no chest pain or dyspnea on exertion positive for - dyspnea on exertion and cough, non productive Respiratory: positive for - cough Neurological: no TIA or stroke symptoms Dermatological: negative ENT: negative Skin Gastrointestinal: no abdominal pain, change in bowel habits, or black or bloody stools Genito-Urinary: no dysuria, trouble voiding, or hematuria Hematological and Lymphatic: negative Breast: negative Musculoskeletal: positive for - joint pain Remaining ROS negative.  Physical Exam: Blood pressure 118/78, pulse 106, temperature 98 F (36.7 C), weight 195 lb 8 oz (88.678 kg). ECOG: 0General appearance: alert, cooperative and appears stated age Head: Normocephalic, without obvious abnormality, atraumatic Neck: no adenopathy, no carotid bruit, no JVD, supple, symmetrical, trachea midline and thyroid not enlarged, symmetric, no tenderness/mass/nodules Lymph nodes:  Cervical, supraclavicular, and axillary nodes normal. Cardiac : regular rate and rhythm, no murmurs or gallops Pulmonary:clear to auscultation bilaterally and normal percussion bilaterally Breasts: negative Abdomen:soft, non-tender; bowel sounds normal; no masses,  no organomegaly Extremities negative, cyanosis, clubbing Neuro: alert, oriented, normal speech, no focal findings or movement disorder noted  Lab Results: Lab Results  Component Value Date   WBC 6.1 02/20/2011   HGB 14.7 02/20/2011   HCT 43.4 02/20/2011   MCV 96.6 02/20/2011   PLT 156 02/20/2011     Chemistry      Component Value Date/Time   NA 136 02/20/2011 1308   NA 137 08/23/2010 1251   K 4.0 02/20/2011 1308   K 4.1 08/23/2010 1251   CL 97* 02/20/2011 1308   CL 105 08/23/2010 1251   CO2 27 02/20/2011 1308   CO2 25 08/23/2010 1251   BUN 7 08/26/2011 1347   BUN 6 08/23/2010 1251   CREATININE 0.7 08/26/2011 1347   CREATININE 0.76 08/23/2010 1251      Component Value Date/Time   CALCIUM 9.1 02/20/2011 1308   CALCIUM 9.0 08/23/2010 1251   ALKPHOS 50 02/20/2011 1308   ALKPHOS 51 08/23/2010 1251   AST 21 02/20/2011 1308   AST 21 08/23/2010 1251   ALT 21 08/23/2010 1251   BILITOT 1.40 02/20/2011 1308   BILITOT 0.5 08/23/2010 1251      .pathology. Radiological Studies: recent ct c/a/p NED chest X-ray n/a Mammogram n/a Bone density n/a  Impression and Plan: F/u 6 months with repeat scans. Apparently doing well.   More than 50% of the visit was spent in patient-related counselling   Pierce Crane, MD 12/13/20123:29 PM

## 2012-02-18 ENCOUNTER — Other Ambulatory Visit: Payer: Self-pay | Admitting: Oncology

## 2012-02-18 DIAGNOSIS — C3490 Malignant neoplasm of unspecified part of unspecified bronchus or lung: Secondary | ICD-10-CM

## 2012-02-21 ENCOUNTER — Other Ambulatory Visit (HOSPITAL_BASED_OUTPATIENT_CLINIC_OR_DEPARTMENT_OTHER): Payer: Medicare Other | Admitting: Lab

## 2012-02-21 ENCOUNTER — Ambulatory Visit (HOSPITAL_COMMUNITY)
Admission: RE | Admit: 2012-02-21 | Discharge: 2012-02-21 | Disposition: A | Discharge status: Home / Self Care | Payer: Medicare Other | Source: Ambulatory Visit | Attending: Oncology | Admitting: Oncology

## 2012-02-21 DIAGNOSIS — M51379 Other intervertebral disc degeneration, lumbosacral region without mention of lumbar back pain or lower extremity pain: Secondary | ICD-10-CM | POA: Insufficient documentation

## 2012-02-21 DIAGNOSIS — K5730 Diverticulosis of large intestine without perforation or abscess without bleeding: Secondary | ICD-10-CM | POA: Insufficient documentation

## 2012-02-21 DIAGNOSIS — I708 Atherosclerosis of other arteries: Secondary | ICD-10-CM | POA: Insufficient documentation

## 2012-02-21 DIAGNOSIS — D175 Benign lipomatous neoplasm of intra-abdominal organs: Secondary | ICD-10-CM | POA: Insufficient documentation

## 2012-02-21 DIAGNOSIS — I700 Atherosclerosis of aorta: Secondary | ICD-10-CM | POA: Insufficient documentation

## 2012-02-21 DIAGNOSIS — C189 Malignant neoplasm of colon, unspecified: Secondary | ICD-10-CM | POA: Insufficient documentation

## 2012-02-21 DIAGNOSIS — M5137 Other intervertebral disc degeneration, lumbosacral region: Secondary | ICD-10-CM | POA: Insufficient documentation

## 2012-02-21 DIAGNOSIS — Z9049 Acquired absence of other specified parts of digestive tract: Secondary | ICD-10-CM | POA: Insufficient documentation

## 2012-02-21 DIAGNOSIS — C3490 Malignant neoplasm of unspecified part of unspecified bronchus or lung: Secondary | ICD-10-CM

## 2012-02-21 DIAGNOSIS — J479 Bronchiectasis, uncomplicated: Secondary | ICD-10-CM | POA: Insufficient documentation

## 2012-02-21 DIAGNOSIS — J984 Other disorders of lung: Secondary | ICD-10-CM | POA: Insufficient documentation

## 2012-02-21 DIAGNOSIS — J438 Other emphysema: Secondary | ICD-10-CM | POA: Insufficient documentation

## 2012-02-21 DIAGNOSIS — N400 Enlarged prostate without lower urinary tract symptoms: Secondary | ICD-10-CM | POA: Insufficient documentation

## 2012-02-21 DIAGNOSIS — I2510 Atherosclerotic heart disease of native coronary artery without angina pectoris: Secondary | ICD-10-CM | POA: Insufficient documentation

## 2012-02-21 LAB — BASIC METABOLIC PANEL - CANCER CENTER ONLY
BUN, Bld: 5 mg/dL — ABNORMAL LOW (ref 7–22)
CO2: 30 mEq/L (ref 18–33)
Calcium: 9.1 mg/dL (ref 8.0–10.3)
Creat: 1 mg/dl (ref 0.6–1.2)
Glucose, Bld: 105 mg/dL (ref 73–118)

## 2012-02-21 MED ORDER — IOHEXOL 300 MG/ML  SOLN
100.0000 mL | Freq: Once | INTRAMUSCULAR | Status: AC | PRN
  Administered 2012-02-21: 100 mL via INTRAVENOUS

## 2012-02-27 ENCOUNTER — Other Ambulatory Visit (HOSPITAL_COMMUNITY): Payer: Medicare Other

## 2012-02-28 ENCOUNTER — Telehealth: Payer: Self-pay | Admitting: Oncology

## 2012-02-28 ENCOUNTER — Ambulatory Visit (HOSPITAL_BASED_OUTPATIENT_CLINIC_OR_DEPARTMENT_OTHER): Payer: Medicare Other | Admitting: Oncology

## 2012-02-28 VITALS — BP 124/82 | HR 98 | Temp 97.8°F | Ht 69.0 in | Wt 196.2 lb

## 2012-02-28 DIAGNOSIS — Z85038 Personal history of other malignant neoplasm of large intestine: Secondary | ICD-10-CM

## 2012-02-28 DIAGNOSIS — C3490 Malignant neoplasm of unspecified part of unspecified bronchus or lung: Secondary | ICD-10-CM

## 2012-02-28 DIAGNOSIS — Z85118 Personal history of other malignant neoplasm of bronchus and lung: Secondary | ICD-10-CM

## 2012-02-28 DIAGNOSIS — M069 Rheumatoid arthritis, unspecified: Secondary | ICD-10-CM

## 2012-02-28 DIAGNOSIS — C189 Malignant neoplasm of colon, unspecified: Secondary | ICD-10-CM

## 2012-02-28 NOTE — Progress Notes (Signed)
Hematology and Oncology Follow Up Visit  [name] [name] [id number] [birth year] 66 y.o. 02/28/2012 1:54 PM PCP Dr Lacretia Nicks Gavin Potters  Principle Diagnosis: 66 yo man with hx of stage 2 colon ca s/p chemo 2003; Hx stage 3 lung cancer, 2008, s/p chemo/xrt followed by lobectomy with pCr, performed by dr Edwyna Shell Hx of rheumatoid arthritis , followed by dr Gavin Potters in [place] , currently on arava x 6 mo and prednisone 5 mg.  Interim History:  There have been no intercurrent illness, hospitalizations or medication changes, apart for the arava for rheumatoid , which is causing diarrhea, he had a recent URTI.  Medications: I have reviewed the patient's current medications.  Allergies:  Allergies  Allergen Reactions  . Aspirin     REACTION: stomach bleeding ulcers  . Diphenhydramine Hcl     REACTION: restless leg,hyperactivity    Past Medical History, Surgical history, Social history, and Family History were reviewed and updated.  Review of Systems: Constitutional:  Negative for fever, chills, night sweats, anorexia, weight loss, pain. Cardiovascular: no chest pain or dyspnea on exertion positive for - dyspnea on exertion and cough, non productive Respiratory: positive for - cough Neurological: no TIA or stroke symptoms Dermatological: negative ENT: negative Skin Gastrointestinal: no abdominal pain, change in bowel habits, or black or bloody stools Genito-Urinary: no dysuria, trouble voiding, or hematuria Hematological and Lymphatic: negative Breast: negative Musculoskeletal: positive for - joint pain Remaining ROS negative.  Physical Exam: Blood pressure 124/82, pulse 98, temperature 97.8 F (36.6 C), height 5\' 9"  (1.753 m), weight 196 lb 3.2 oz (88.996 kg). ECOG: 0General appearance: alert, cooperative and appears stated age Head: Normocephalic, without obvious abnormality, atraumatic Neck: no adenopathy, no carotid bruit, no JVD, supple, symmetrical, trachea midline and thyroid not  enlarged, symmetric, no tenderness/mass/nodules Lymph nodes: Cervical, supraclavicular, and axillary nodes normal. Cardiac : regular rate and rhythm, no murmurs or gallops Pulmonary:clear to auscultation bilaterally and normal percussion bilaterally Breasts: negative Abdomen:soft, non-tender; bowel sounds normal; no masses,  no organomegaly Extremities negative, cyanosis, clubbing Neuro: alert, oriented, normal speech, no focal findings or movement disorder noted  Lab Results: Lab Results  Component Value Date   WBC 6.1 02/20/2011   HGB 14.7 02/20/2011   HCT 43.4 02/20/2011   MCV 96.6 02/20/2011   PLT 156 02/20/2011     Chemistry      Component Value Date/Time   NA 138 02/21/2012 1241   NA 137 08/23/2010 1251   K 4.5 02/21/2012 1241   K 4.1 08/23/2010 1251   CL 97* 02/21/2012 1241   CL 105 08/23/2010 1251   CO2 30 02/21/2012 1241   CO2 25 08/23/2010 1251   BUN 5* 02/21/2012 1241   BUN 6 08/23/2010 1251   CREATININE 1.0 02/21/2012 1241   CREATININE 0.76 08/23/2010 1251      Component Value Date/Time   CALCIUM 9.1 02/21/2012 1241   CALCIUM 9.0 08/23/2010 1251   ALKPHOS 50 02/20/2011 1308   ALKPHOS 51 08/23/2010 1251   AST 21 02/20/2011 1308   AST 21 08/23/2010 1251   ALT 21 08/23/2010 1251   BILITOT 1.40 02/20/2011 1308   BILITOT 0.5 08/23/2010 1251      .pathology. Radiological Studies: recent ct c/a/p NED chest X-ray n/a Mammogram n/a Bone density n/a  Impression and Plan: F/u 12 months with repeat  cxr. Apparently doing well. He will be due for f/u colonoscopy this year.  More than 50% of the visit was spent in patient-related counselling   Pierce Crane, MD  6/14/20131:54 PM

## 2012-02-28 NOTE — Telephone Encounter (Signed)
Gv pt aptp for april2014. gv pt copy of CXR orders for [phone]

## 2012-02-28 NOTE — Progress Notes (Signed)
Hematology and Oncology Follow Up Visit  [name] [name] [id number] 02/09/1946 66 y.o. 02/28/2012 1:50 PM PCP  Principle Diagnosis: 66 yo man with hx of stage 2 colon ca s/p chemo 2003; Hx stage 3 lung cancer, 2008, s/p chemo/xrt followed by lobectomy with pCr, performed by dr Edwyna Shell Hx of rheumatoid arthritis , followed by dr Gavin Potters in [place] , currently on arava x 6 mo and prednisone 5 mg.  Interim History:  There have been no intercurrent illness, hospitalizations or medication changes, apart for the arava for rheumatoid , which is causing diarrhea. He had a recent URTI> Medications: I have reviewed the patient's current medications.  Allergies:  Allergies  Allergen Reactions  . Aspirin     REACTION: stomach bleeding ulcers  . Diphenhydramine Hcl     REACTION: restless leg,hyperactivity    Past Medical History, Surgical history, Social history, and Family History were reviewed and updated.  Review of Systems: Constitutional:  Negative for fever, chills, night sweats, anorexia, weight loss, pain. Cardiovascular: no chest pain or dyspnea on exertion positive for - dyspnea on exertion and cough, non productive Respiratory: positive for - cough Neurological: no TIA or stroke symptoms Dermatological: negative ENT: negative Skin Gastrointestinal: no abdominal pain, change in bowel habits, or black or bloody stools Genito-Urinary: no dysuria, trouble voiding, or hematuria Hematological and Lymphatic: negative Breast: negative Musculoskeletal: positive for - joint pain Remaining ROS negative.  Physical Exam: Blood pressure 124/82, pulse 98, temperature 97.8 F (36.6 C), height 5\' 9"  (1.753 m), weight 196 lb 3.2 oz (88.996 kg). ECOG: 0General appearance: alert, cooperative and appears stated age Head: Normocephalic, without obvious abnormality, atraumatic Neck: no adenopathy, no carotid bruit, no JVD, supple, symmetrical, trachea midline and thyroid not enlarged,  symmetric, no tenderness/mass/nodules Lymph nodes: Cervical, supraclavicular, and axillary nodes normal. Cardiac : regular rate and rhythm, no murmurs or gallops Pulmonary:clear to auscultation bilaterally and normal percussion bilaterally Breasts: negative Abdomen:soft, non-tender; bowel sounds normal; no masses,  no organomegaly Extremities negative, cyanosis, clubbing Neuro: alert, oriented, normal speech, no focal findings or movement disorder noted  Lab Results: Lab Results  Component Value Date   WBC 6.1 02/20/2011   HGB 14.7 02/20/2011   HCT 43.4 02/20/2011   MCV 96.6 02/20/2011   PLT 156 02/20/2011     Chemistry      Component Value Date/Time   NA 138 02/21/2012 1241   NA 137 08/23/2010 1251   K 4.5 02/21/2012 1241   K 4.1 08/23/2010 1251   CL 97* 02/21/2012 1241   CL 105 08/23/2010 1251   CO2 30 02/21/2012 1241   CO2 25 08/23/2010 1251   BUN 5* 02/21/2012 1241   BUN 6 08/23/2010 1251   CREATININE 1.0 02/21/2012 1241   CREATININE 0.76 08/23/2010 1251      Component Value Date/Time   CALCIUM 9.1 02/21/2012 1241   CALCIUM 9.0 08/23/2010 1251   ALKPHOS 50 02/20/2011 1308   ALKPHOS 51 08/23/2010 1251   AST 21 02/20/2011 1308   AST 21 08/23/2010 1251   ALT 21 08/23/2010 1251   BILITOT 1.40 02/20/2011 1308   BILITOT 0.5 08/23/2010 1251       CT CHEST, ABDOMEN AND PELVIS WITH CONTRAST  Technique: Multidetector CT imaging of the chest, abdomen and  pelvis was performed following the standard protocol during bolus  administration of intravenous contrast.  Contrast: OMNIPAQUE IOHEXOL 300 MG/ML SOLN  Comparison: CT chest pelvis 08/26/2011 and nuclear medicine PET CT  and CT chest, abdomen, pelvis  02/20/2011  CT CHEST  Findings: Normal thyroid gland. Surgical clips in the right  hilum. Right hilar nodal tissue measuring 15 x 17 mm is stable. No  supraclavicular, axillary, or mediastinal lymphadenopathy.  Negative for pleural or pericardial effusion.  Coronary artery atherosclerosis present, with  normal heart size.  Thoracic aorta demonstrates atherosclerotic change and is normal in  caliber. Surgical clips are seen adjacent to the esophagus, near  the gastroesophageal junction. Right perihilar bronchiectasis and  peribronchial thickening is unchanged. Pleural-based nodular  density in the posterior upper right hemithorax on image #16 of the  lung windows is stable, likely reflects scarring. Moderate  centrilobular emphysema. Biapical pleuroparenchymal scarring is  stable. No suspicious or new nodules.  Thoracic spine vertebral bodies normal in height and alignment.  Several thoracic spine hemangiomas noted, the largest at T7,  stable. No evidence of bony metastatic disease.  IMPRESSION:  Stable postsurgical and postradiation changes in the right lung and  right hilum. No evidence of lung cancer recurrence or metastatic  disease.  Emphysema.  CT ABDOMEN AND PELVIS  Findings: Liver is normal in size and enhancement. No focal  hepatic lesion. The gallbladder, spleen, pancreas, and kidneys are  within normal limits.  Stable low density thickening of both adrenal glands, left greater  than right.  Atherosclerotic calcification of the abdominal aorta and iliac  vasculature, without aneurysm.  Stable mild thickening of the urinary bladder wall.  Stomach decompressed. Small bowel loops normal in caliber and wall  thickness.  Postsurgical changes of right hemicolectomy. Lipoma (16 mm  diameter) within the lumen of the descending colon on image number  85. When compared to prior studies, this lipoma appears stable.  No evidence of bowel obstruction. Scattered colonic diverticula.  No acute inflammatory changes or free fluid. Negative for  mesenteric or retroperitoneal lymphadenopathy.  Prostate gland stable mildly enlarged and stable.  Inguinal regions unremarkable, aside from atherosclerotic  calcification of the femoral artery vasculature.  Vertebral bodies normal in height  alignment. There is degenerative  disc disease at L4-5 and L5-S1. There is posterior osseous  spurring at L4-5 that narrows the spinal canal. This is stable.  No suspicious osseous lesion.  IMPRESSION:  Stable exam. No evidence of recurrent or metastatic disease in the  abdomen or pelvis.   Mammogram n/a Bone density n/a  Impression and Plan: F/u 4/14,with cxr, he is doing well he is 5 years out from his lung cancer and close to 10 years from his colon cancer diagnosis. He is now due for a colonoscopy this year.  More than 50% of the visit was spent in patient-related counselling   Pierce Crane, MD 6/14/20131:50 PM

## 2012-08-17 ENCOUNTER — Telehealth: Payer: Self-pay | Admitting: *Deleted

## 2012-08-17 NOTE — Telephone Encounter (Signed)
Mailed out calendar to inform the patient new date and time md only appointment

## 2012-10-17 ENCOUNTER — Encounter: Payer: Self-pay | Admitting: Oncology

## 2012-10-17 ENCOUNTER — Telehealth: Payer: Self-pay | Admitting: Oncology

## 2012-10-17 NOTE — Telephone Encounter (Signed)
Former PR pt reassigned to [facility]. S/w pt today and pt has requested JG and is ok w/waiting until Mat to see him. Pt moved to [facility] and given appt for lb/cxr 5/2 and JG 5/9. Letter mailed today.

## 2012-12-16 ENCOUNTER — Other Ambulatory Visit: Payer: Self-pay | Admitting: Rheumatology

## 2012-12-16 LAB — SYNOVIAL CELL COUNT + DIFF, W/ CRYSTALS
Crystals, Joint Fluid: NONE SEEN
Eosinophil: 0 %

## 2012-12-20 LAB — BODY FLUID CULTURE

## 2012-12-22 ENCOUNTER — Ambulatory Visit: Payer: Medicare Other | Admitting: Internal Medicine

## 2012-12-22 ENCOUNTER — Other Ambulatory Visit: Payer: Medicare Other | Admitting: Lab

## 2012-12-29 ENCOUNTER — Ambulatory Visit: Payer: Medicare Other | Admitting: Oncology

## 2013-01-07 ENCOUNTER — Ambulatory Visit: Payer: Medicare Other | Admitting: Oncology

## 2013-01-14 ENCOUNTER — Other Ambulatory Visit: Payer: Self-pay | Admitting: *Deleted

## 2013-01-14 DIAGNOSIS — D649 Anemia, unspecified: Secondary | ICD-10-CM

## 2013-01-14 DIAGNOSIS — Z85118 Personal history of other malignant neoplasm of bronchus and lung: Secondary | ICD-10-CM

## 2013-01-15 ENCOUNTER — Other Ambulatory Visit (HOSPITAL_BASED_OUTPATIENT_CLINIC_OR_DEPARTMENT_OTHER): Payer: Medicare Other

## 2013-01-15 DIAGNOSIS — D649 Anemia, unspecified: Secondary | ICD-10-CM

## 2013-01-15 DIAGNOSIS — Z85038 Personal history of other malignant neoplasm of large intestine: Secondary | ICD-10-CM

## 2013-01-15 DIAGNOSIS — Z85118 Personal history of other malignant neoplasm of bronchus and lung: Secondary | ICD-10-CM

## 2013-01-15 LAB — CBC WITH DIFFERENTIAL/PLATELET
BASO%: 0.2 % (ref 0.0–2.0)
Basophils Absolute: 0 10*3/uL (ref 0.0–0.1)
Eosinophils Absolute: 0 10*3/uL (ref 0.0–0.5)
HCT: 44.3 % (ref 38.4–49.9)
HGB: 15.2 g/dL (ref 13.0–17.1)
MONO#: 0.3 10*3/uL (ref 0.1–0.9)
NEUT#: 3.7 10*3/uL (ref 1.5–6.5)
NEUT%: 81.9 % — ABNORMAL HIGH (ref 39.0–75.0)
Platelets: 169 10*3/uL (ref 140–400)
WBC: 4.5 10*3/uL (ref 4.0–10.3)
lymph#: 0.5 10*3/uL — ABNORMAL LOW (ref 0.9–3.3)

## 2013-01-15 LAB — COMPREHENSIVE METABOLIC PANEL (CC13)
BUN: 5.9 mg/dL — ABNORMAL LOW (ref 7.0–26.0)
CO2: 28 mEq/L (ref 22–29)
Glucose: 100 mg/dl — ABNORMAL HIGH (ref 70–99)
Sodium: 136 mEq/L (ref 136–145)
Total Bilirubin: 0.97 mg/dL (ref 0.20–1.20)
Total Protein: 7.2 g/dL (ref 6.4–8.3)

## 2013-01-22 ENCOUNTER — Telehealth: Payer: Self-pay | Admitting: Oncology

## 2013-01-22 ENCOUNTER — Ambulatory Visit (HOSPITAL_COMMUNITY)
Admission: RE | Admit: 2013-01-22 | Discharge: 2013-01-22 | Disposition: A | Discharge status: Home / Self Care | Payer: Medicare Other | Source: Ambulatory Visit | Attending: Oncology | Admitting: Oncology

## 2013-01-22 ENCOUNTER — Ambulatory Visit (HOSPITAL_BASED_OUTPATIENT_CLINIC_OR_DEPARTMENT_OTHER): Payer: Medicare Other | Admitting: Oncology

## 2013-01-22 DIAGNOSIS — N400 Enlarged prostate without lower urinary tract symptoms: Secondary | ICD-10-CM

## 2013-01-22 DIAGNOSIS — M069 Rheumatoid arthritis, unspecified: Secondary | ICD-10-CM

## 2013-01-22 DIAGNOSIS — Z85118 Personal history of other malignant neoplasm of bronchus and lung: Secondary | ICD-10-CM

## 2013-01-22 DIAGNOSIS — C3490 Malignant neoplasm of unspecified part of unspecified bronchus or lung: Secondary | ICD-10-CM | POA: Insufficient documentation

## 2013-01-22 DIAGNOSIS — Z85038 Personal history of other malignant neoplasm of large intestine: Secondary | ICD-10-CM

## 2013-01-22 DIAGNOSIS — C189 Malignant neoplasm of colon, unspecified: Secondary | ICD-10-CM

## 2013-01-22 DIAGNOSIS — I10 Essential (primary) hypertension: Secondary | ICD-10-CM

## 2013-01-22 DIAGNOSIS — C342 Malignant neoplasm of middle lobe, bronchus or lung: Secondary | ICD-10-CM

## 2013-01-22 NOTE — Telephone Encounter (Signed)
gv and printed appt sched anda vs for pt...sent pt to radiology   °

## 2013-01-24 ENCOUNTER — Encounter: Payer: Self-pay | Admitting: Oncology

## 2013-01-24 DIAGNOSIS — M069 Rheumatoid arthritis, unspecified: Secondary | ICD-10-CM

## 2013-01-24 DIAGNOSIS — I10 Essential (primary) hypertension: Secondary | ICD-10-CM | POA: Insufficient documentation

## 2013-01-24 DIAGNOSIS — C342 Malignant neoplasm of middle lobe, bronchus or lung: Secondary | ICD-10-CM | POA: Insufficient documentation

## 2013-01-24 DIAGNOSIS — N400 Enlarged prostate without lower urinary tract symptoms: Secondary | ICD-10-CM

## 2013-01-24 DIAGNOSIS — C189 Malignant neoplasm of colon, unspecified: Secondary | ICD-10-CM

## 2013-01-24 HISTORY — DX: Malignant neoplasm of colon, unspecified: C18.9

## 2013-01-24 HISTORY — DX: Rheumatoid arthritis, unspecified: M06.9

## 2013-01-24 HISTORY — DX: Benign prostatic hyperplasia without lower urinary tract symptoms: N40.0

## 2013-01-24 HISTORY — DX: Malignant neoplasm of middle lobe, bronchus or lung: C34.2

## 2013-01-24 NOTE — Progress Notes (Signed)
Hematology and Oncology Follow Up Visit  [name] [name] [id number] 08-20-1946 66 y.o. 01/24/2013 6:37 PM   Principle Diagnosis: Encounter Diagnoses  Name Primary?  . Lung cancer, lower lobe, left Yes  . History of colon cancer, stage II   . Rheumatoid arthritis      Interim History:  I will be assuming the oncologic care for this 67 year old man with a history of both stage II colon cancer and stage III non-small cell lung cancer formerly followed by Dr. Donnie Coffin.  The patient has long-standing rheumatoid arthritis and has been on a number of immunosuppressive drugs including prednisone, methotrexate, and Enbrel. 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 (Pathologic T3, N0, M0). 1.5 cm primary. No vascular or lymphatic invasion. He received adjuvant chemotherapy with 6 cycles of 5-fluorouracil plus leucovorin.  He is a former heavy smoker. 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 4.2 cm cavitary right middle lobe lesion with associated right hilar adenopathy which was PET avid. He underwent right middle lobe resection by Dr. Edwyna Shell on 08/20/2007 following neoadjuvant chemoradiation. He received weekly carboplatin and Taxol during radiation. Radiation given between August 4 and 06/08/2007. 36 gray to the primary tumor. Boost to 45 gray. 2 additional soft tissue deposits in the right middle lobe satellite lesions versus second primary also treated with a final total tumor dose of 65 gray.  Pathology showed extensive necrosis and fibrosis but no residual tumor either in the lung tissue or lymph nodes sampled at levels 4R, 10 R., and 11 R and level VII. He has been followed since that time with no obvious signs of recurrence. Most recent CT scans  done one year ago 02/21/2012 unremarkable except for postsurgical changes.  He is currently asymptomatic. No cough, no dyspnea, he stopped smoking at the time of his cancer diagnosis. He reports no change in bowel habit, no hematochezia or melena. He is followed by Dr. Claudette Head. He had his last colonoscopy 3 years ago. He is currently taking Arava 10 mg daily, Plaquenil 200 mg daily, and 5 mg of prednisone daily to control his rheumatoid arthritis but he does seem to be flaring up again and his rheumatologist is considering changing his mmunosuppressive regimen.   He is a retired [employer] manager. He has a strong family history of cancer. A sister had breast cancer but is alive and well. A sister and a brother had esophageal cancer. He has 2 siblings without cancer. He has a healthy son age 67 and a daughter age 75 who is a Runner, broadcasting/film/video.  Past history also includes peptic ulcer disease requiring surgery when he was in his 19s. He has hypertension on Norvasc 2.5 mg daily and benign prostatic hypertrophy on Cardura 4 mg at bedtime.    Medications: reviewed  Allergies:  Allergies  Allergen Reactions  . Aspirin     REACTION: stomach bleeding ulcers  . Diphenhydramine Hcl     REACTION: restless leg,hyperactivity    Review of Systems: Constitutional:   No constitutional symptoms Respiratory: See above Cardiovascular: No chest pain or palpitations  Gastrointestinal: See above Genito-Urinary: No urinary tract symptoms Musculoskeletal: See above-active rheumatoid arthritis Neurologic: No headache or change in vision Skin: No rash or ecchymosis Remaining ROS negative.  Physical Exam: Blood pressure 136/89, pulse 98, temperature 97.6 F (  36.4 C), temperature source Oral, resp. rate 18, height 5\' 9"  (1.753 m), weight 200 lb 11.2 oz (91.037 kg). Wt Readings from Last 3 Encounters:  01/22/13 200 lb 11.2 oz (91.037 kg)  02/28/12 196 lb 3.2 oz (88.996 kg)  08/29/11 195 lb 8 oz (88.678 kg)      General appearance: Well-nourished Caucasian man HENNT: Pharynx no erythema or exudate Lymph nodes: No adenopathy Breasts: Lungs: Clear to auscultation resonant to percussion Heart: Regular rhythm no murmur Abdomen: Soft, nontender, no mass, no organomegaly Extremities: No edema, no calf tenderness Musculoskeletal: No joint deformities GU: Vascular: No cyanosis, no carotid bruits Neurologic: Motor strength 5 over 5, reflexes 1+ symmetric Skin: No rash or ecchymosis  Lab Results: Lab Results  Component Value Date   WBC 4.5 01/15/2013   HGB 15.2 01/15/2013   HCT 44.3 01/15/2013   MCV 97.7 01/15/2013   PLT 169 01/15/2013     Chemistry      Component Value Date/Time   NA 136 01/15/2013 1454   NA 138 02/21/2012 1241   NA 137 08/23/2010 1251   K 4.2 01/15/2013 1454   K 4.5 02/21/2012 1241   K 4.1 08/23/2010 1251   CL 98 01/15/2013 1454   CL 97* 02/21/2012 1241   CL 105 08/23/2010 1251   CO2 28 01/15/2013 1454   CO2 30 02/21/2012 1241   CO2 25 08/23/2010 1251   BUN 5.9* 01/15/2013 1454   BUN 5* 02/21/2012 1241   BUN 6 08/23/2010 1251   CREATININE 0.8 01/15/2013 1454   CREATININE 1.0 02/21/2012 1241   CREATININE 0.76 08/23/2010 1251      Component Value Date/Time   CALCIUM 9.6 01/15/2013 1454   CALCIUM 9.1 02/21/2012 1241   CALCIUM 9.0 08/23/2010 1251   ALKPHOS 68 01/15/2013 1454   ALKPHOS 50 02/20/2011 1308   ALKPHOS 51 08/23/2010 1251   AST 13 01/15/2013 1454   AST 21 02/20/2011 1308   AST 21 08/23/2010 1251   ALT 12 01/15/2013 1454   ALT 21 08/23/2010 1251   BILITOT 0.97 01/15/2013 1454   BILITOT 1.40 02/20/2011 1308   BILITOT 0.5 08/23/2010 1251       Radiological Studies: Dg Chest 2 View  01/22/2013  *RADIOLOGY REPORT*  Clinical Data: Lung and colon cancer, restaging  CHEST - 2 VIEW  Comparison: CT 02/21/2012.  No recent chest radiograph at this institution since 12/09/2007  Findings: Comparison is difficult due to differences in technique. When compared to the scout image of the previous dissimilar most  recent exam 02/21/2012, there appears to be increased central density in the area of architectural distortion/spiculation in the right hilum.  Left hilum is mildly full but stable, most likely vascular pedicle.  Heart size is mildly enlarged.  Posterior right seventh rib fracture again noted.  No new acute osseous finding. No pleural effusion. Epigastric clips are present.  No acute osseous finding.  IMPRESSION: Comparison is dissimilar due to differences in technique, with increased density at the right hilum apparent on the frontal but not the lateral projection.  This is favored to be projectional due to superimposition of scarring, however development of tumor at the previous treatment location could also account for this appearance.  Chest CT would offer improved comparison compared to similar more recent exams.   Original Report Authenticated By: Christiana Pellant, M.D.     Impression: #1. 11 years status post right hemicolectomy and adjuvant 5-FU leucovorin for stage II moderately differentiated adenocarcinoma of the colon. No evidence  for recurrent disease.  #2. T 2, N1, M0 squamous cell carcinoma right lung treated with induction chemotherapy and radiation therapy and subsequent surgical resection August through September 2008. No gross evidence for recurrence. There are some vague changes on current chest x-ray done today and we will need to follow these up with a CT scan.  #3. Essential hypertension  #4. BPH  #5. Long-standing rheumatoid arthritis I don't see any major contraindication to him going back on a TNF inhibitor.     CC:.    Levert Feinstein, MD 5/11/20146:37 PM

## 2013-01-25 ENCOUNTER — Other Ambulatory Visit: Payer: Self-pay | Admitting: Oncology

## 2013-01-25 DIAGNOSIS — C189 Malignant neoplasm of colon, unspecified: Secondary | ICD-10-CM

## 2013-01-25 DIAGNOSIS — C342 Malignant neoplasm of middle lobe, bronchus or lung: Secondary | ICD-10-CM

## 2013-01-27 ENCOUNTER — Other Ambulatory Visit: Payer: Self-pay | Admitting: Dermatology

## 2013-01-28 ENCOUNTER — Ambulatory Visit (HOSPITAL_COMMUNITY)
Admission: RE | Admit: 2013-01-28 | Discharge: 2013-01-28 | Disposition: A | Discharge status: Home / Self Care | Payer: Medicare Other | Source: Ambulatory Visit | Attending: Oncology | Admitting: Oncology

## 2013-01-28 DIAGNOSIS — M899 Disorder of bone, unspecified: Secondary | ICD-10-CM | POA: Insufficient documentation

## 2013-01-28 DIAGNOSIS — D175 Benign lipomatous neoplasm of intra-abdominal organs: Secondary | ICD-10-CM | POA: Insufficient documentation

## 2013-01-28 DIAGNOSIS — R911 Solitary pulmonary nodule: Secondary | ICD-10-CM | POA: Insufficient documentation

## 2013-01-28 DIAGNOSIS — E278 Other specified disorders of adrenal gland: Secondary | ICD-10-CM | POA: Insufficient documentation

## 2013-01-28 DIAGNOSIS — M51379 Other intervertebral disc degeneration, lumbosacral region without mention of lumbar back pain or lower extremity pain: Secondary | ICD-10-CM | POA: Insufficient documentation

## 2013-01-28 DIAGNOSIS — C342 Malignant neoplasm of middle lobe, bronchus or lung: Secondary | ICD-10-CM | POA: Insufficient documentation

## 2013-01-28 DIAGNOSIS — C189 Malignant neoplasm of colon, unspecified: Secondary | ICD-10-CM

## 2013-01-28 DIAGNOSIS — M479 Spondylosis, unspecified: Secondary | ICD-10-CM | POA: Insufficient documentation

## 2013-01-28 DIAGNOSIS — I2510 Atherosclerotic heart disease of native coronary artery without angina pectoris: Secondary | ICD-10-CM | POA: Insufficient documentation

## 2013-01-28 DIAGNOSIS — Z85038 Personal history of other malignant neoplasm of large intestine: Secondary | ICD-10-CM | POA: Insufficient documentation

## 2013-01-28 DIAGNOSIS — M5137 Other intervertebral disc degeneration, lumbosacral region: Secondary | ICD-10-CM | POA: Insufficient documentation

## 2013-01-28 MED ORDER — IOHEXOL 300 MG/ML  SOLN
100.0000 mL | Freq: Once | INTRAMUSCULAR | Status: AC | PRN
  Administered 2013-01-28: 100 mL via INTRAVENOUS

## 2013-02-02 ENCOUNTER — Encounter: Payer: Self-pay | Admitting: Gastroenterology

## 2013-02-02 ENCOUNTER — Telehealth: Payer: Self-pay | Admitting: *Deleted

## 2013-02-02 NOTE — Telephone Encounter (Signed)
Pt's wife called & was given report per Dr Patsy Lager instructions of CT scan.

## 2013-02-02 NOTE — Telephone Encounter (Signed)
Message copied by Sabino Snipes on Tue Feb 02, 2013  1:37 PM ------      Message from: Levert Feinstein      Created: Sun Jan 31, 2013  1:58 PM       Call patient CT scan some non specific changes but no obvious recurrence of cancer in lung or abdomen ------

## 2013-04-21 ENCOUNTER — Other Ambulatory Visit: Payer: Self-pay

## 2013-04-22 ENCOUNTER — Other Ambulatory Visit: Payer: Self-pay | Admitting: Oncology

## 2013-05-18 ENCOUNTER — Other Ambulatory Visit: Payer: Self-pay | Admitting: Rheumatology

## 2013-05-18 LAB — SYNOVIAL CELL COUNT + DIFF, W/ CRYSTALS
Basophil: 0 %
Lymphocytes: 11 %
Nucleated Cell Count: 5768 /mm3
Other Cells BF: 0 %

## 2013-05-22 LAB — BODY FLUID CULTURE

## 2013-06-08 ENCOUNTER — Encounter: Payer: Self-pay | Admitting: Gastroenterology

## 2013-07-19 ENCOUNTER — Ambulatory Visit (AMBULATORY_SURGERY_CENTER): Payer: Medicare Other

## 2013-07-19 VITALS — Ht 69.0 in | Wt 185.0 lb

## 2013-07-19 DIAGNOSIS — Z85038 Personal history of other malignant neoplasm of large intestine: Secondary | ICD-10-CM

## 2013-07-19 MED ORDER — MOVIPREP 100 G PO SOLR: 1.0000 | Freq: Once | ORAL | Status: DC

## 2013-07-22 ENCOUNTER — Other Ambulatory Visit: Payer: Self-pay

## 2013-08-17 ENCOUNTER — Encounter: Payer: Self-pay | Admitting: Gastroenterology

## 2013-08-17 ENCOUNTER — Ambulatory Visit (AMBULATORY_SURGERY_CENTER): Payer: Medicare Other | Admitting: Gastroenterology

## 2013-08-17 VITALS — BP 174/63 | HR 81 | Temp 98.1°F | Resp 14 | Ht 69.0 in | Wt 185.0 lb

## 2013-08-17 DIAGNOSIS — Z85038 Personal history of other malignant neoplasm of large intestine: Secondary | ICD-10-CM

## 2013-08-17 MED ORDER — SODIUM CHLORIDE 0.9 % IV SOLN: 500.0000 mL | INTRAVENOUS | Status: DC

## 2013-08-17 NOTE — Op Note (Signed)
 [facility] [address] [place], [address]   COLONOSCOPY PROCEDURE REPORT  PATIENT: [name] [name], [name] [name]  MR#: [id number] BIRTHDATE: 1945/12/11 , 67  yrs. old GENDER: Male ENDOSCOPIST: Meryl Dare, MD, [facility] PROCEDURE DATE:  08/17/2013 PROCEDURE:   Colonoscopy, surveillance First Screening Colonoscopy - Avg.  risk and is 50 yrs.  old or older - No.  Prior Negative Screening - Now for repeat screening. N/A  History of Adenoma - Now for follow-up colonoscopy & has been > or = to 3 yrs.  N/A  Polyps Removed Today? No.  Recommend repeat exam, <10 yrs? Yes.  High risk (family or personal hx). ASA CLASS:   Class II INDICATIONS:High risk patient with personal history of colon cancer: T3, N0 in 2003. MEDICATIONS: MAC sedation, administered by CRNA and propofol (Diprivan) 400mg  IV DESCRIPTION OF PROCEDURE:   After the risks benefits and alternatives of the procedure were thoroughly explained, informed consent was obtained.  A digital rectal exam revealed no abnormalities of the rectum.   The LB [id number]  endoscope was introduced through the anus and advanced to the surgical anastomosis. No adverse events experienced with a tortuous colon. The quality of the prep was good, using MoviPrep  The instrument was then slowly withdrawn as the colon was fully examined.  COLON FINDINGS: Moderate diverticulosis was noted in the transverse colon, at the splenic flexure, in the descending colon, and sigmoid colon.  The colon was otherwise normal.  There was no diverticulosis, inflammation, polyps or cancers unless previously stated.   There was evidence of a prior ileocolonic surgical anastomosis in the ascending colon. Medium sized lipoma was found in the transverse colon.  Retroflexed views revealed small internal hemorrhoids. The time to cecum=3 minutes 34 seconds.  Withdrawal time=8 minutes 00 seconds.  The scope was withdrawn and the procedure  completed. COMPLICATIONS: There were no complications.  ENDOSCOPIC IMPRESSION: 1.   Moderate diverticulosis was noted in the transverse, splenic flexure, descending and sigmoid colon 2.   Small internal hemorrhoids 3.   Prior ileocolonic surgical anastomosis in the ascending colon 4.   Medium sized lipoma in the transverse colon  RECOMMENDATIONS: 1.  High fiber diet with liberal fluid intake. 2.  Repeat Colonoscopy in 5 years.  eSigned:  Meryl Dare, MD, Clementeen Graham 08/17/2013 2:00 PM   cc: Cecille Amsterdam MD

## 2013-08-17 NOTE — Progress Notes (Signed)
Upon entering recovery pt. Coughing up thick copious sputum. Aroused pt. From drowsy state and cleaned mouth.

## 2013-08-17 NOTE — Progress Notes (Signed)
Patient did not experience any of the following events: a burn prior to discharge; a fall within the facility; wrong site/side/patient/procedure/implant event; or a hospital transfer or hospital admission upon discharge from the facility. (G8907) Patient did not have preoperative order for IV antibiotic SSI prophylaxis. (G8918)  

## 2013-08-17 NOTE — Patient Instructions (Signed)

## 2013-08-18 ENCOUNTER — Telehealth: Payer: Self-pay

## 2013-08-18 NOTE — Telephone Encounter (Signed)
  Follow up Call-  Call back number 08/17/2013  Post procedure Call Back phone  # [phone]  Permission to leave phone message Yes     Patient questions:  Do you have a fever, pain , or abdominal swelling? no Pain Score  0 *  Have you tolerated food without any problems? yes  Have you been able to return to your normal activities? yes  Do you have any questions about your discharge instructions: Diet   no Medications  no Follow up visit  no  Do you have questions or concerns about your Care? no  Actions: * If pain score is 4 or above: No action needed, pain <4.   Pt's wife said the pt was fine. maw

## 2013-11-13 ENCOUNTER — Telehealth: Payer: Self-pay | Admitting: *Deleted

## 2013-11-13 ENCOUNTER — Encounter: Payer: Self-pay | Admitting: Oncology

## 2013-11-13 NOTE — Telephone Encounter (Signed)
Mailed provider change letter w/ calendar to pt and cancelled Dr. Synthia Innocent appt.

## 2013-12-22 DIAGNOSIS — M810 Age-related osteoporosis without current pathological fracture: Secondary | ICD-10-CM | POA: Insufficient documentation

## 2013-12-22 DIAGNOSIS — C3492 Malignant neoplasm of unspecified part of left bronchus or lung: Secondary | ICD-10-CM | POA: Insufficient documentation

## 2013-12-22 DIAGNOSIS — M069 Rheumatoid arthritis, unspecified: Secondary | ICD-10-CM | POA: Insufficient documentation

## 2013-12-22 DIAGNOSIS — I7300 Raynaud's syndrome without gangrene: Secondary | ICD-10-CM | POA: Insufficient documentation

## 2014-01-06 DIAGNOSIS — Z79899 Other long term (current) drug therapy: Secondary | ICD-10-CM | POA: Insufficient documentation

## 2014-01-20 ENCOUNTER — Ambulatory Visit (HOSPITAL_BASED_OUTPATIENT_CLINIC_OR_DEPARTMENT_OTHER): Payer: Medicare Other | Admitting: Internal Medicine

## 2014-01-20 ENCOUNTER — Telehealth: Payer: Self-pay | Admitting: Medical Oncology

## 2014-01-20 ENCOUNTER — Other Ambulatory Visit (HOSPITAL_BASED_OUTPATIENT_CLINIC_OR_DEPARTMENT_OTHER): Payer: Medicare Other

## 2014-01-20 ENCOUNTER — Ambulatory Visit (HOSPITAL_COMMUNITY)
Admission: RE | Admit: 2014-01-20 | Discharge: 2014-01-20 | Disposition: A | Discharge status: Home / Self Care | Payer: Medicare Other | Source: Ambulatory Visit | Attending: Internal Medicine | Admitting: Internal Medicine

## 2014-01-20 ENCOUNTER — Telehealth: Payer: Self-pay | Admitting: Internal Medicine

## 2014-01-20 ENCOUNTER — Encounter: Payer: Self-pay | Admitting: Internal Medicine

## 2014-01-20 VITALS — BP 125/78 | HR 87 | Temp 97.8°F | Resp 17 | Ht 69.0 in | Wt 179.5 lb

## 2014-01-20 DIAGNOSIS — I10 Essential (primary) hypertension: Secondary | ICD-10-CM | POA: Insufficient documentation

## 2014-01-20 DIAGNOSIS — Z85118 Personal history of other malignant neoplasm of bronchus and lung: Secondary | ICD-10-CM

## 2014-01-20 DIAGNOSIS — Z85038 Personal history of other malignant neoplasm of large intestine: Secondary | ICD-10-CM | POA: Insufficient documentation

## 2014-01-20 DIAGNOSIS — J984 Other disorders of lung: Secondary | ICD-10-CM | POA: Insufficient documentation

## 2014-01-20 DIAGNOSIS — M069 Rheumatoid arthritis, unspecified: Secondary | ICD-10-CM

## 2014-01-20 DIAGNOSIS — R05 Cough: Secondary | ICD-10-CM | POA: Insufficient documentation

## 2014-01-20 DIAGNOSIS — C189 Malignant neoplasm of colon, unspecified: Secondary | ICD-10-CM

## 2014-01-20 DIAGNOSIS — C342 Malignant neoplasm of middle lobe, bronchus or lung: Secondary | ICD-10-CM

## 2014-01-20 DIAGNOSIS — R059 Cough, unspecified: Secondary | ICD-10-CM | POA: Insufficient documentation

## 2014-01-20 LAB — COMPREHENSIVE METABOLIC PANEL (CC13)
ALT: 10 U/L (ref 0–55)
AST: 18 U/L (ref 5–34)
Albumin: 3.1 g/dL — ABNORMAL LOW (ref 3.5–5.0)
Alkaline Phosphatase: 59 U/L (ref 40–150)
Anion Gap: 11 mEq/L (ref 3–11)
BUN: 4 mg/dL — ABNORMAL LOW (ref 7.0–26.0)
CO2: 23 mEq/L (ref 22–29)
Calcium: 9.3 mg/dL (ref 8.4–10.4)
Chloride: 102 mEq/L (ref 98–109)
Creatinine: 0.7 mg/dL (ref 0.7–1.3)
Glucose: 64 mg/dl — ABNORMAL LOW (ref 70–140)
Potassium: 3.5 mEq/L (ref 3.5–5.1)
Sodium: 136 mEq/L (ref 136–145)
Total Bilirubin: 1.05 mg/dL (ref 0.20–1.20)
Total Protein: 6.4 g/dL (ref 6.4–8.3)

## 2014-01-20 LAB — CBC WITH DIFFERENTIAL/PLATELET
BASO%: 0.3 % (ref 0.0–2.0)
Basophils Absolute: 0 10*3/uL (ref 0.0–0.1)
EOS%: 2.7 % (ref 0.0–7.0)
Eosinophils Absolute: 0.1 10*3/uL (ref 0.0–0.5)
HCT: 43.4 % (ref 38.4–49.9)
HGB: 14.5 g/dL (ref 13.0–17.1)
LYMPH%: 18.6 % (ref 14.0–49.0)
MCH: 31.3 pg (ref 27.2–33.4)
MCHC: 33.4 g/dL (ref 32.0–36.0)
MCV: 93.7 fL (ref 79.3–98.0)
MONO#: 0.5 10*3/uL (ref 0.1–0.9)
MONO%: 14.8 % — AB (ref 0.0–14.0)
NEUT%: 63.6 % (ref 39.0–75.0)
NEUTROS ABS: 2.1 10*3/uL (ref 1.5–6.5)
Platelets: 141 10*3/uL (ref 140–400)
RBC: 4.63 10*6/uL (ref 4.20–5.82)
RDW: 17.1 % — AB (ref 11.0–14.6)
WBC: 3.3 10*3/uL — ABNORMAL LOW (ref 4.0–10.3)
lymph#: 0.6 10*3/uL — ABNORMAL LOW (ref 0.9–3.3)

## 2014-01-20 NOTE — Telephone Encounter (Signed)
gv adn printed appt sched and avs foropt for May 2016

## 2014-01-20 NOTE — Telephone Encounter (Signed)
Left message per Dr Julien Nordmann that CXR looks fine.Marland Kitchen

## 2014-01-20 NOTE — Progress Notes (Signed)
[facility] [phone]   [phone]  OFFICE PROGRESS NOTE  [name] Boston, MD [place] [address]  DIAGNOSIS:  1) Stage II, moderately differentiated adenocarcinoma of the right colon with extension through the muscularis but with 15 negative lymph nodes status post right hemicolectomy 06/09/2002. (Pathologic T3, N0, M0). 1.5 cm primary. 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 4.2 cm cavitary right middle lobe lesion with associated right hilar adenopathy which was PET avid.   PRIOR THERAPY: 1) status post right hemicolectomy 06/09/2002. (Pathologic T3, N0, M0). 1.5 cm primary. No vascular or lymphatic invasion followed by  adjuvant chemotherapy with 6 cycles of 5-fluorouracil plus leucovorin. 2) status post right middle lobe resection by Dr. Arlyce Dice on 08/20/2007 following neoadjuvant chemoradiation. He received weekly carboplatin and Taxol during radiation. Radiation given between August 4 and 06/08/2007. 36 gray to the primary tumor. Boost to 45 gray. 2 additional soft tissue deposits in the right middle lobe satellite lesions versus second primary also treated with a final total tumor dose of 65 gray.  CURRENT THERAPY: Observation.  INTERVAL HISTORY: [name] [name] 68 y.o. male returns to the clinic today for annual followup visit accompanied by his wife. He is a former patient of Dr. Archie Balboa and Dr. Beryle Beams is here today to establish care with me after the two physician left the practice. The patient has a history of stage II colon adenocarcinoma status post right hemicolectomy with lymph node dissection followed by 6 months of adjuvant chemotherapy with 5-FU and leucovorin. He was also diagnosed with right middle lobe stage IIIA  non-small cell lung cancer status post neoadjuvant concurrent chemoradiation followed  by surgical resection under the care of Dr. Arlyce Dice. He has been observation since 2008 with no significant evidence for disease recurrence. He was last seen by Dr. Beryle Beams last year and CT scan of the chest, abdomen and pelvis were unremarkable for any disease recurrence. He is feeling fine today with no specific complaints except for her chest congestion from seasonal allergy. He denied having any significant shortness of breath but has mild cough with no hemoptysis. He denied having any significant weight loss or night sweats. The patient denied having any rectal bleeding.  MEDICAL HISTORY: Past Medical History  Diagnosis Date  . Cancer     lung/colon ca  . Colon cancer 01/24/2013    T3N0M0  right colon  R hemicolectomy   . 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 Rheumatoid arthritis(714.0) 01/24/2013    Since age 48  . BPH (benign prostatic hyperplasia) 01/24/2013  . HTN (hypertension), benign 01/24/2013    ALLERGIES:  is allergic to aspirin and diphenhydramine hcl.  MEDICATIONS:  Current Outpatient Prescriptions  Medication Sig Dispense Refill  . amLODipine (NORVASC) 2.5 MG tablet Take 2.5 mg by mouth daily.      . Calcium Carbonate-Vitamin D (CALCIUM + D PO) Take 600 mg by mouth daily.      Marland Kitchen alendronate (FOSAMAX) 40 MG tablet Take 40 mg by mouth.      . doxazosin (CARDURA) 4 MG tablet Take 4 mg by mouth at bedtime.       . fluocinonide cream (LIDEX) 0.05 %       . hydroxychloroquine (PLAQUENIL) 200 MG tablet Take  200 mg by mouth daily.      . InFLIXimab (REMICADE IV) Inject into the vein.      Marland Kitchen leflunomide (ARAVA) 10 MG tablet Take 10 mg by mouth daily.        Marland Kitchen omeprazole (PRILOSEC) 20 MG capsule Take 20 mg by mouth daily.        . prednisoLONE 5 MG TABS Take 5 mg by mouth.        . predniSONE (DELTASONE) 5 MG tablet Take 5 mg by mouth.      . tamsulosin (FLOMAX) 0.4 MG CAPS capsule       .  vitamin C (ASCORBIC ACID) 500 MG tablet Take 500 mg by mouth daily.      . vitamin E 400 UNIT capsule Take 400 Units by mouth daily.       No current facility-administered medications for this visit.    SURGICAL HISTORY:  Past Surgical History  Procedure Laterality Date  . Gastrectomy  1974    bleeding ulcers  . Lumbar disc surgery  2003  . Colectomy  2004    right  . Lung lobectomy  2007    right  . Tendons ruptured  2010    right    REVIEW OF SYSTEMS:  Constitutional: negative Eyes: negative Ears, nose, mouth, throat, and face: negative Respiratory: positive for cough Cardiovascular: negative Gastrointestinal: negative Genitourinary:negative Integument/breast: negative Hematologic/lymphatic: negative Musculoskeletal:negative Neurological: negative Behavioral/Psych: negative Endocrine: negative Allergic/Immunologic: negative   PHYSICAL EXAMINATION: General appearance: alert, cooperative and no distress Head: Normocephalic, without obvious abnormality, atraumatic Neck: no adenopathy, no JVD, supple, symmetrical, trachea midline and thyroid not enlarged, symmetric, no tenderness/mass/nodules Lymph nodes: Cervical, supraclavicular, and axillary nodes normal. Resp: clear to auscultation bilaterally Back: symmetric, no curvature. ROM normal. No CVA tenderness. Cardio: regular rate and rhythm, S1, S2 normal, no murmur, click, rub or gallop GI: soft, non-tender; bowel sounds normal; no masses,  no organomegaly Extremities: extremities normal, atraumatic, no cyanosis or edema Neurologic: Alert and oriented X 3, normal strength and tone. Normal symmetric reflexes. Normal coordination and gait  ECOG PERFORMANCE STATUS: 0 - Asymptomatic  Blood pressure 125/78, pulse 87, temperature 97.8 F (36.6 C), temperature source Oral, resp. rate 17, height 5\' 9"  (1.753 m), weight 179 lb 8 oz (81.421 kg), SpO2 96.00%.  LABORATORY DATA: Lab Results  Component Value Date   WBC 3.3*  01/20/2014   HGB 14.5 01/20/2014   HCT 43.4 01/20/2014   MCV 93.7 01/20/2014   PLT 141 01/20/2014      Chemistry      Component Value Date/Time   NA 136 01/15/2013 1454   NA 138 02/21/2012 1241   NA 137 08/23/2010 1251   K 4.2 01/15/2013 1454   K 4.5 02/21/2012 1241   K 4.1 08/23/2010 1251   CL 98 01/15/2013 1454   CL 97* 02/21/2012 1241   CL 105 08/23/2010 1251   CO2 28 01/15/2013 1454   CO2 30 02/21/2012 1241   CO2 25 08/23/2010 1251   BUN 5.9* 01/15/2013 1454   BUN 5* 02/21/2012 1241   BUN 6 08/23/2010 1251   CREATININE 0.8 01/15/2013 1454   CREATININE 1.0 02/21/2012 1241   CREATININE 0.76 08/23/2010 1251      Component Value Date/Time   CALCIUM 9.6 01/15/2013 1454   CALCIUM 9.1 02/21/2012 1241   CALCIUM 9.0 08/23/2010 1251   ALKPHOS 68 01/15/2013 1454   ALKPHOS 50 02/20/2011 1308   ALKPHOS 51 08/23/2010 1251   AST 13 01/15/2013  1454   AST 21 02/20/2011 1308   AST 21 08/23/2010 1251   ALT 12 01/15/2013 1454   ALT 20 02/20/2011 1308   ALT 21 08/23/2010 1251   BILITOT 0.97 01/15/2013 1454   BILITOT 1.40 02/20/2011 1308   BILITOT 0.5 08/23/2010 1251       RADIOGRAPHIC STUDIES: No results found.  ASSESSMENT AND PLAN: This is a very pleasant 68 years old white male with history of stage II colon adenocarcinoma diagnosed in 2003 in addition to stage III non-small cell lung cancer diagnosed in 2008 status post dissection as well as adjuvant therapy. He has been observation since that time was no significant evidence for disease recurrence. I have a lengthy discussion with the patient today and reviewed his previous records. I will order repeat chest x-ray today for evaluation of his disease. The patient will continue on observation with repeat CBC, comprehensive metabolic panel, CEA and chest x-ray in one year. He was advised to call immediately if he has any concerning symptoms in the interval.  The patient voices understanding of current disease status and treatment options and is in agreement with the current care  plan.  All questions were answered. The patient knows to call the clinic with any problems, questions or concerns. We can certainly see the patient much sooner if necessary.  I spent 20 minutes counseling the patient face to face. The total time spent in the appointment was 30 minutes.  Disclaimer: This note was dictated with voice recognition software. Similar sounding words can inadvertently be transcribed and may not be corrected upon review.

## 2014-01-21 ENCOUNTER — Ambulatory Visit: Payer: Medicare Other | Admitting: Oncology

## 2014-01-21 ENCOUNTER — Other Ambulatory Visit: Payer: Medicare Other

## 2014-01-21 ENCOUNTER — Other Ambulatory Visit: Payer: Medicare Other | Admitting: Lab

## 2014-04-28 ENCOUNTER — Encounter: Payer: Self-pay | Admitting: Gastroenterology

## 2014-05-04 ENCOUNTER — Encounter (INDEPENDENT_AMBULATORY_CARE_PROVIDER_SITE_OTHER): Payer: Self-pay | Admitting: Surgery

## 2014-05-04 ENCOUNTER — Ambulatory Visit (INDEPENDENT_AMBULATORY_CARE_PROVIDER_SITE_OTHER): Payer: Medicare Other | Admitting: Surgery

## 2014-05-04 ENCOUNTER — Other Ambulatory Visit (INDEPENDENT_AMBULATORY_CARE_PROVIDER_SITE_OTHER): Payer: Self-pay | Admitting: Surgery

## 2014-05-04 VITALS — BP 128/88 | HR 98 | Temp 98.3°F | Ht 69.0 in | Wt 177.2 lb

## 2014-05-04 DIAGNOSIS — K4090 Unilateral inguinal hernia, without obstruction or gangrene, not specified as recurrent: Secondary | ICD-10-CM | POA: Insufficient documentation

## 2014-05-04 NOTE — Progress Notes (Signed)
Chief Complaint:  New right inguinal hernia  History of Present Illness:  [name] [name] is an 68 y.o. male active man referred by Dr. Katrine Coho with a [place] that occurred this spring after a bout of flu with coughing.  He is from [facility] and likes to ride his bicycle.  He has a right inguinal hernia this easily visible. I explained repair with mesh including the risk of recurrence nerve pain and numbness. He would like to go and get this repaired and we will schedule this as an outpatient. The goal try to set this up at [facility] day surgery  Past Medical History  Diagnosis Date  . Cancer     lung/colon ca  . Colon cancer 01/24/2013    T3N0M0  right colon  R hemicolectomy   . 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 Rheumatoid arthritis(714.0) 01/24/2013    Since age 76  . BPH (benign prostatic hyperplasia) 01/24/2013  . HTN (hypertension), benign 01/24/2013    Past Surgical History  Procedure Laterality Date  . Gastrectomy  1974    bleeding ulcers  . Lumbar disc surgery  2003  . Colectomy  2004    right  . Lung lobectomy  2007    right  . Tendons ruptured  2010    right    Current Outpatient Prescriptions  Medication Sig Dispense Refill  . amLODipine (NORVASC) 2.5 MG tablet Take 2.5 mg by mouth daily.      . Calcium Carbonate-Vitamin D (CALCIUM + D PO) Take 600 mg by mouth daily.      Marland Kitchen omeprazole (PRILOSEC) 20 MG capsule Take 20 mg by mouth daily.        . prednisoLONE 5 MG TABS Take 5 mg by mouth.        . tamsulosin (FLOMAX) 0.4 MG CAPS capsule       . vitamin C (ASCORBIC ACID) 500 MG tablet Take 500 mg by mouth daily.      . vitamin E 400 UNIT capsule Take 400 Units by mouth daily.      Marland Kitchen doxazosin (CARDURA) 4 MG tablet Take 4 mg by mouth at bedtime.       . fluocinonide cream (LIDEX) 0.05 %       . InFLIXimab (REMICADE IV) Inject into the vein.      Marland Kitchen leflunomide (ARAVA) 10 MG tablet Take 10  mg by mouth daily.         No current facility-administered medications for this visit.   Aspirin and Diphenhydramine hcl Family History  Problem Relation Age of Onset  . Colon cancer Neg Hx   . Pancreatic cancer Neg Hx   . Stomach cancer Neg Hx   . Cancer Sister   . Cancer Brother    Social History:   reports that he has quit smoking. He has never used smokeless tobacco. He reports that he drinks about 7.2 ounces of alcohol per week. He reports that he does not use illicit drugs.   REVIEW OF SYSTEMS : Negative except for above-noted cancer surgeries.  Physical Exam:   Blood pressure 128/88, pulse 98, temperature 98.3 F (36.8 C), temperature source Oral, height 5\' 9"  (1.753 m), weight 177 lb 4 oz (80.4 kg). Body mass index is 26.16 kg/(m^2).  Gen:  WDWN white male NAD  Neurological: Alert and oriented to person, place, and time. Motor and sensory function  is grossly intact  Head: Normocephalic and atraumatic.  Eyes: Conjunctivae are normal. Pupils are equal, round, and reactive to light. No scleral icterus.  Neck: Normal range of motion. Neck supple. No tracheal deviation or thyromegaly present.  Cardiovascular:  SR without murmurs or gallops.  No carotid bruits Breast:  Not examined Respiratory: Effort normal.  No respiratory distress. No chest wall tenderness. Breath sounds normal.  No wheezes, rales or rhonchi.  Abdomen:  Prominent bulge in the right inguinal region that is reducible and consistent with a right inguinal hernia. GU:  See above Musculoskeletal: Normal range of motion. Extremities are nontender. No cyanosis, edema or clubbing noted Lymphadenopathy: No cervical, preauricular, postauricular or axillary adenopathy is present Skin: Skin is warm and dry. No rash noted. No diaphoresis. No erythema. No pallor. Pscyh: Normal mood and affect. Behavior is normal. Judgment and thought content normal.   LABORATORY RESULTS: No results found for this or any previous visit  (from the past 48 hour(s)).   RADIOLOGY RESULTS: No results found.  Problem List: Patient Active Problem List   Diagnosis Date Noted  . Colon cancer 01/24/2013  . Cancer of middle lobe of lung 01/24/2013  . Rheumatoid arthritis(714.0) 01/24/2013  . BPH (benign prostatic hyperplasia) 01/24/2013  . HTN (hypertension), benign 01/24/2013  . Anemia 08/26/2011    Assessment & Plan: Prominent right inguinal hernia. Plan open right inguinal hernia repair with mesh at [facility] day surgery as an outpatient.    Matt B. Hassell Done, MD, [facility] [phone] beeper [phone]  05/04/2014 2:20 PM

## 2014-05-04 NOTE — Patient Instructions (Signed)

## 2014-05-18 ENCOUNTER — Encounter (HOSPITAL_COMMUNITY): Payer: Self-pay | Admitting: Pharmacy Technician

## 2014-05-19 ENCOUNTER — Inpatient Hospital Stay (HOSPITAL_COMMUNITY): Admission: RE | Admit: 2014-05-19 | Payer: BLUE CROSS/BLUE SHIELD | Source: Ambulatory Visit

## 2014-05-20 ENCOUNTER — Ambulatory Visit (HOSPITAL_COMMUNITY): Admission: RE | Admit: 2014-05-20 | Payer: Medicare Other | Source: Ambulatory Visit | Admitting: Surgery

## 2014-05-20 ENCOUNTER — Encounter (HOSPITAL_COMMUNITY): Admission: RE | Payer: Self-pay | Source: Ambulatory Visit

## 2014-05-20 SURGERY — REPAIR, HERNIA, INGUINAL, ADULT
Anesthesia: General | Laterality: Right

## 2014-05-30 ENCOUNTER — Other Ambulatory Visit: Payer: Self-pay

## 2014-05-30 ENCOUNTER — Encounter (HOSPITAL_BASED_OUTPATIENT_CLINIC_OR_DEPARTMENT_OTHER)
Admission: RE | Admit: 2014-05-30 | Discharge: 2014-05-30 | Disposition: A | Discharge status: Home / Self Care | Payer: Medicare Other | Source: Ambulatory Visit | Attending: Surgery | Admitting: Surgery

## 2014-05-30 ENCOUNTER — Encounter (HOSPITAL_BASED_OUTPATIENT_CLINIC_OR_DEPARTMENT_OTHER): Payer: Self-pay | Admitting: *Deleted

## 2014-05-30 DIAGNOSIS — Z85038 Personal history of other malignant neoplasm of large intestine: Secondary | ICD-10-CM | POA: Diagnosis not present

## 2014-05-30 DIAGNOSIS — K4090 Unilateral inguinal hernia, without obstruction or gangrene, not specified as recurrent: Secondary | ICD-10-CM | POA: Diagnosis present

## 2014-05-30 DIAGNOSIS — M069 Rheumatoid arthritis, unspecified: Secondary | ICD-10-CM | POA: Diagnosis not present

## 2014-05-30 DIAGNOSIS — I10 Essential (primary) hypertension: Secondary | ICD-10-CM | POA: Diagnosis not present

## 2014-05-30 DIAGNOSIS — Z9049 Acquired absence of other specified parts of digestive tract: Secondary | ICD-10-CM | POA: Diagnosis not present

## 2014-05-30 DIAGNOSIS — Z85118 Personal history of other malignant neoplasm of bronchus and lung: Secondary | ICD-10-CM | POA: Diagnosis not present

## 2014-05-30 DIAGNOSIS — N400 Enlarged prostate without lower urinary tract symptoms: Secondary | ICD-10-CM | POA: Diagnosis not present

## 2014-05-30 DIAGNOSIS — Z79899 Other long term (current) drug therapy: Secondary | ICD-10-CM | POA: Diagnosis not present

## 2014-05-30 DIAGNOSIS — Z01810 Encounter for preprocedural cardiovascular examination: Secondary | ICD-10-CM | POA: Diagnosis not present

## 2014-05-30 LAB — BASIC METABOLIC PANEL
Anion gap: 13 (ref 5–15)
BUN: 6 mg/dL (ref 6–23)
CHLORIDE: 97 meq/L (ref 96–112)
CO2: 24 meq/L (ref 19–32)
CREATININE: 0.62 mg/dL (ref 0.50–1.35)
Calcium: 8.9 mg/dL (ref 8.4–10.5)
GFR calc Af Amer: >90 mL/min (ref >90)
GFR calc non Af Amer: >90 mL/min (ref >90)
GLUCOSE: 94 mg/dL (ref 70–99)
Potassium: 3.9 mEq/L (ref 3.7–5.3)
Sodium: 134 mEq/L — ABNORMAL LOW (ref 137–147)

## 2014-05-30 NOTE — Progress Notes (Signed)
Pt here-[id number] done

## 2014-06-03 ENCOUNTER — Ambulatory Visit (HOSPITAL_BASED_OUTPATIENT_CLINIC_OR_DEPARTMENT_OTHER)
Admission: RE | Admit: 2014-06-03 | Discharge: 2014-06-03 | Disposition: A | Discharge status: Home / Self Care | Payer: Medicare Other | Source: Ambulatory Visit | Attending: Surgery | Admitting: Surgery

## 2014-06-03 ENCOUNTER — Encounter (HOSPITAL_BASED_OUTPATIENT_CLINIC_OR_DEPARTMENT_OTHER): Payer: Medicare Other | Admitting: Certified Registered"

## 2014-06-03 ENCOUNTER — Encounter (HOSPITAL_BASED_OUTPATIENT_CLINIC_OR_DEPARTMENT_OTHER): Payer: Self-pay | Admitting: Certified Registered"

## 2014-06-03 ENCOUNTER — Encounter (HOSPITAL_BASED_OUTPATIENT_CLINIC_OR_DEPARTMENT_OTHER)
Admission: RE | Disposition: A | Discharge status: Home / Self Care | Payer: Self-pay | Source: Ambulatory Visit | Attending: Surgery

## 2014-06-03 ENCOUNTER — Ambulatory Visit (HOSPITAL_BASED_OUTPATIENT_CLINIC_OR_DEPARTMENT_OTHER): Payer: Medicare Other | Admitting: Certified Registered"

## 2014-06-03 DIAGNOSIS — N400 Enlarged prostate without lower urinary tract symptoms: Secondary | ICD-10-CM | POA: Insufficient documentation

## 2014-06-03 DIAGNOSIS — Z85038 Personal history of other malignant neoplasm of large intestine: Secondary | ICD-10-CM | POA: Insufficient documentation

## 2014-06-03 DIAGNOSIS — K4090 Unilateral inguinal hernia, without obstruction or gangrene, not specified as recurrent: Secondary | ICD-10-CM | POA: Insufficient documentation

## 2014-06-03 DIAGNOSIS — M069 Rheumatoid arthritis, unspecified: Secondary | ICD-10-CM | POA: Diagnosis not present

## 2014-06-03 DIAGNOSIS — Z9049 Acquired absence of other specified parts of digestive tract: Secondary | ICD-10-CM | POA: Insufficient documentation

## 2014-06-03 DIAGNOSIS — I10 Essential (primary) hypertension: Secondary | ICD-10-CM | POA: Diagnosis not present

## 2014-06-03 DIAGNOSIS — Z79899 Other long term (current) drug therapy: Secondary | ICD-10-CM | POA: Insufficient documentation

## 2014-06-03 DIAGNOSIS — Z85118 Personal history of other malignant neoplasm of bronchus and lung: Secondary | ICD-10-CM | POA: Insufficient documentation

## 2014-06-03 HISTORY — DX: Raynaud's syndrome without gangrene: I73.00

## 2014-06-03 HISTORY — PX: INGUINAL HERNIA REPAIR: SHX194

## 2014-06-03 HISTORY — DX: Gastro-esophageal reflux disease without esophagitis: K21.9

## 2014-06-03 SURGERY — REPAIR, HERNIA, INGUINAL, ADULT
Anesthesia: General | Laterality: Right

## 2014-06-03 MED ORDER — PROPOFOL 10 MG/ML IV BOLUS
INTRAVENOUS | Status: DC | PRN
  Administered 2014-06-03: 150 mg via INTRAVENOUS

## 2014-06-03 MED ORDER — HYDROCODONE-ACETAMINOPHEN 5-325 MG PO TABS: 1.0000 | ORAL_TABLET | ORAL | Status: DC | PRN

## 2014-06-03 MED ORDER — MIDAZOLAM HCL 2 MG/2ML IJ SOLN
INTRAMUSCULAR | Status: AC
  Filled 2014-06-03: qty 2

## 2014-06-03 MED ORDER — LIDOCAINE HCL (CARDIAC) 20 MG/ML IV SOLN
INTRAVENOUS | Status: DC | PRN
  Administered 2014-06-03: 60 mg via INTRAVENOUS

## 2014-06-03 MED ORDER — FENTANYL CITRATE 0.05 MG/ML IJ SOLN
INTRAMUSCULAR | Status: AC
  Filled 2014-06-03: qty 6

## 2014-06-03 MED ORDER — HEPARIN SODIUM (PORCINE) 5000 UNIT/ML IJ SOLN
INTRAMUSCULAR | Status: AC
Start: 2014-06-03 — End: 2014-06-03
  Filled 2014-06-03: qty 1

## 2014-06-03 MED ORDER — HYDROMORPHONE HCL 1 MG/ML IJ SOLN: 0.2500 mg | INTRAMUSCULAR | Status: DC | PRN

## 2014-06-03 MED ORDER — EPHEDRINE SULFATE 50 MG/ML IJ SOLN
INTRAMUSCULAR | Status: DC | PRN
  Administered 2014-06-03 (×3): 10 mg via INTRAVENOUS

## 2014-06-03 MED ORDER — BUPIVACAINE HCL (PF) 0.25 % IJ SOLN
INTRAMUSCULAR | Status: AC
  Filled 2014-06-03: qty 30

## 2014-06-03 MED ORDER — LIDOCAINE HCL (PF) 1 % IJ SOLN
INTRAMUSCULAR | Status: AC
  Filled 2014-06-03: qty 30

## 2014-06-03 MED ORDER — FENTANYL CITRATE 0.05 MG/ML IJ SOLN
INTRAMUSCULAR | Status: DC | PRN
Start: 2014-06-03 — End: 2014-06-03
  Administered 2014-06-03 (×2): 25 ug via INTRAVENOUS
  Administered 2014-06-03: 50 ug via INTRAVENOUS
  Administered 2014-06-03 (×2): 25 ug via INTRAVENOUS
  Administered 2014-06-03: 50 ug via INTRAVENOUS

## 2014-06-03 MED ORDER — CHLORHEXIDINE GLUCONATE 4 % EX LIQD: 1.0000 "application " | Freq: Once | CUTANEOUS | Status: DC

## 2014-06-03 MED ORDER — CEFAZOLIN SODIUM-DEXTROSE 2-3 GM-% IV SOLR
INTRAVENOUS | Status: AC
  Filled 2014-06-03: qty 50

## 2014-06-03 MED ORDER — HEPARIN SODIUM (PORCINE) 5000 UNIT/ML IJ SOLN
5000.0000 [IU] | Freq: Once | INTRAMUSCULAR | Status: AC
  Administered 2014-06-03: 5000 [IU] via SUBCUTANEOUS

## 2014-06-03 MED ORDER — MIDAZOLAM HCL 5 MG/5ML IJ SOLN
INTRAMUSCULAR | Status: DC | PRN
  Administered 2014-06-03: 0.5 mg via INTRAVENOUS

## 2014-06-03 MED ORDER — ONDANSETRON HCL 4 MG/2ML IJ SOLN
INTRAMUSCULAR | Status: DC | PRN
  Administered 2014-06-03: 4 mg via INTRAVENOUS

## 2014-06-03 MED ORDER — BUPIVACAINE HCL (PF) 0.25 % IJ SOLN
INTRAMUSCULAR | Status: DC | PRN
  Administered 2014-06-03: 25 mL

## 2014-06-03 MED ORDER — SODIUM BICARBONATE 4 % IV SOLN
INTRAVENOUS | Status: AC
Start: 2014-06-03 — End: 2014-06-03
  Filled 2014-06-03: qty 5

## 2014-06-03 MED ORDER — LACTATED RINGERS IV SOLN
INTRAVENOUS | Status: DC
Start: 2014-06-03 — End: 2014-06-03
  Administered 2014-06-03 (×2): via INTRAVENOUS

## 2014-06-03 MED ORDER — DEXAMETHASONE SODIUM PHOSPHATE 4 MG/ML IJ SOLN
INTRAMUSCULAR | Status: DC | PRN
  Administered 2014-06-03: 8 mg via INTRAVENOUS

## 2014-06-03 MED ORDER — CEFAZOLIN SODIUM-DEXTROSE 2-3 GM-% IV SOLR
2.0000 g | INTRAVENOUS | Status: AC
  Administered 2014-06-03: 2 g via INTRAVENOUS

## 2014-06-03 SURGICAL SUPPLY — 55 items
ADH SKN CLS APL DERMABOND .7 (GAUZE/BANDAGES/DRESSINGS) ×1
APL SKNCLS STERI-STRIP NONHPOA (GAUZE/BANDAGES/DRESSINGS)
BENZOIN TINCTURE PRP APPL 2/3 (GAUZE/BANDAGES/DRESSINGS) IMPLANT
BLADE CLIPPER SURG (BLADE) ×2 IMPLANT
BLADE SURG 15 STRL LF DISP TIS (BLADE) ×1 IMPLANT
BLADE SURG 15 STRL SS (BLADE) ×3
CANISTER SUCT 1200ML W/VALVE (MISCELLANEOUS) ×3 IMPLANT
CLEANER CAUTERY TIP 5X5 PAD (MISCELLANEOUS) ×1 IMPLANT
CLOSURE WOUND 1/2 X4 (GAUZE/BANDAGES/DRESSINGS)
COVER MAYO STAND STRL (DRAPES) ×3 IMPLANT
COVER TABLE BACK 60X90 (DRAPES) ×3 IMPLANT
DECANTER SPIKE VIAL GLASS SM (MISCELLANEOUS) ×3 IMPLANT
DERMABOND ADVANCED (GAUZE/BANDAGES/DRESSINGS) ×2
DERMABOND ADVANCED .7 DNX12 (GAUZE/BANDAGES/DRESSINGS) IMPLANT
DRAIN PENROSE 1/2X12 LTX STRL (WOUND CARE) ×3 IMPLANT
DRAPE LAPAROTOMY T 102X78X121 (DRAPES) ×3 IMPLANT
ELECT REM PT RETURN 9FT ADLT (ELECTROSURGICAL) ×3
ELECTRODE REM PT RTRN 9FT ADLT (ELECTROSURGICAL) ×1 IMPLANT
GAUZE SPONGE 4X4 16PLY XRAY LF (GAUZE/BANDAGES/DRESSINGS) IMPLANT
GLOVE BIO SURGEON STRL SZ8 (GLOVE) ×3 IMPLANT
GLOVE BIOGEL PI IND STRL 7.5 (GLOVE) IMPLANT
GLOVE BIOGEL PI INDICATOR 7.5 (GLOVE) ×2
GLOVE ECLIPSE 7.0 STRL STRAW (GLOVE) ×2 IMPLANT
GOWN STRL REUS W/ TWL LRG LVL3 (GOWN DISPOSABLE) ×1 IMPLANT
GOWN STRL REUS W/ TWL XL LVL3 (GOWN DISPOSABLE) ×1 IMPLANT
GOWN STRL REUS W/TWL LRG LVL3 (GOWN DISPOSABLE) ×3
GOWN STRL REUS W/TWL XL LVL3 (GOWN DISPOSABLE) ×3
MESH HERNIA 3X6 (Mesh General) ×2 IMPLANT
NDL HYPO 25X1 1.5 SAFETY (NEEDLE) IMPLANT
NEEDLE HYPO 25X1 1.5 SAFETY (NEEDLE) ×3 IMPLANT
NS IRRIG 1000ML POUR BTL (IV SOLUTION) ×3 IMPLANT
PACK BASIN DAY SURGERY FS (CUSTOM PROCEDURE TRAY) ×3 IMPLANT
PAD CLEANER CAUTERY TIP 5X5 (MISCELLANEOUS) ×2
PENCIL BUTTON HOLSTER BLD 10FT (ELECTRODE) ×3 IMPLANT
SLEEVE SCD COMPRESS KNEE MED (MISCELLANEOUS) ×2 IMPLANT
SPONGE GAUZE 4X4 12PLY STER LF (GAUZE/BANDAGES/DRESSINGS) ×6 IMPLANT
STAPLER VISISTAT 35W (STAPLE) IMPLANT
STRIP CLOSURE SKIN 1/2X4 (GAUZE/BANDAGES/DRESSINGS) IMPLANT
SUT MON AB 5-0 PS2 18 (SUTURE) ×2 IMPLANT
SUT PROLENE 0 CT 1 30 (SUTURE) IMPLANT
SUT PROLENE 2 0 CT2 30 (SUTURE) ×8 IMPLANT
SUT SILK 2 0 SH (SUTURE) ×2 IMPLANT
SUT VIC AB 2-0 SH 27 (SUTURE) ×3
SUT VIC AB 2-0 SH 27XBRD (SUTURE) ×1 IMPLANT
SUT VIC AB 4-0 SH 18 (SUTURE) ×3 IMPLANT
SUT VIC AB 5-0 P-3 18X BRD (SUTURE) IMPLANT
SUT VIC AB 5-0 P3 18 (SUTURE)
SUT VICRYL 4-0 PS2 18IN ABS (SUTURE) IMPLANT
SYR BULB 3OZ (MISCELLANEOUS) ×3 IMPLANT
SYR CONTROL 10ML LL (SYRINGE) ×2 IMPLANT
TOWEL OR 17X24 6PK STRL BLUE (TOWEL DISPOSABLE) ×6 IMPLANT
TRAY DSU PREP LF (CUSTOM PROCEDURE TRAY) ×3 IMPLANT
TUBE CONNECTING 20'X1/4 (TUBING) ×1
TUBE CONNECTING 20X1/4 (TUBING) ×2 IMPLANT
YANKAUER SUCT BULB TIP NO VENT (SUCTIONS) ×3 IMPLANT

## 2014-06-03 NOTE — Interval H&P Note (Signed)
History and Physical Interval Note:  06/03/2014 1:14 PM  [name] [name]  has presented today for surgery, with the diagnosis of right inguinal hernia  The various methods of treatment have been discussed with the patient and family. After consideration of risks, benefits and other options for treatment, the patient has consented to  Procedure(s): HERNIA REPAIR INGUINAL ADULT (Right) as a surgical intervention .  The patient's history has been reviewed, patient examined, no change in status, stable for surgery.  I have reviewed the patient's chart and labs.  Questions were answered to the patient's satisfaction.     [name]

## 2014-06-03 NOTE — H&P (View-Only) (Signed)
Chief Complaint:  New right inguinal hernia  History of Present Illness:  [name] [name] is an 68 y.o. male active man referred by Dr. Katrine Coho with a [place] that occurred this spring after a bout of flu with coughing.  He is from [facility] and likes to ride his bicycle.  He has a right inguinal hernia this easily visible. I explained repair with mesh including the risk of recurrence nerve pain and numbness. He would like to go and get this repaired and we will schedule this as an outpatient. The goal try to set this up at [facility] day surgery  Past Medical History  Diagnosis Date  . Cancer     lung/colon ca  . Colon cancer 01/24/2013    T3N0M0  right colon  R hemicolectomy   . 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 Rheumatoid arthritis(714.0) 01/24/2013    Since age 61  . BPH (benign prostatic hyperplasia) 01/24/2013  . HTN (hypertension), benign 01/24/2013    Past Surgical History  Procedure Laterality Date  . Gastrectomy  1974    bleeding ulcers  . Lumbar disc surgery  2003  . Colectomy  2004    right  . Lung lobectomy  2007    right  . Tendons ruptured  2010    right    Current Outpatient Prescriptions  Medication Sig Dispense Refill  . amLODipine (NORVASC) 2.5 MG tablet Take 2.5 mg by mouth daily.      . Calcium Carbonate-Vitamin D (CALCIUM + D PO) Take 600 mg by mouth daily.      Marland Kitchen omeprazole (PRILOSEC) 20 MG capsule Take 20 mg by mouth daily.        . prednisoLONE 5 MG TABS Take 5 mg by mouth.        . tamsulosin (FLOMAX) 0.4 MG CAPS capsule       . vitamin C (ASCORBIC ACID) 500 MG tablet Take 500 mg by mouth daily.      . vitamin E 400 UNIT capsule Take 400 Units by mouth daily.      Marland Kitchen doxazosin (CARDURA) 4 MG tablet Take 4 mg by mouth at bedtime.       . fluocinonide cream (LIDEX) 0.05 %       . InFLIXimab (REMICADE IV) Inject into the vein.      Marland Kitchen leflunomide (ARAVA) 10 MG tablet Take 10  mg by mouth daily.         No current facility-administered medications for this visit.   Aspirin and Diphenhydramine hcl Family History  Problem Relation Age of Onset  . Colon cancer Neg Hx   . Pancreatic cancer Neg Hx   . Stomach cancer Neg Hx   . Cancer Sister   . Cancer Brother    Social History:   reports that he has quit smoking. He has never used smokeless tobacco. He reports that he drinks about 7.2 ounces of alcohol per week. He reports that he does not use illicit drugs.   REVIEW OF SYSTEMS : Negative except for above-noted cancer surgeries.  Physical Exam:   Blood pressure 128/88, pulse 98, temperature 98.3 F (36.8 C), temperature source Oral, height 5\' 9"  (1.753 m), weight 177 lb 4 oz (80.4 kg). Body mass index is 26.16 kg/(m^2).  Gen:  WDWN white male NAD  Neurological: Alert and oriented to person, place, and time. Motor and sensory function  is grossly intact  Head: Normocephalic and atraumatic.  Eyes: Conjunctivae are normal. Pupils are equal, round, and reactive to light. No scleral icterus.  Neck: Normal range of motion. Neck supple. No tracheal deviation or thyromegaly present.  Cardiovascular:  SR without murmurs or gallops.  No carotid bruits Breast:  Not examined Respiratory: Effort normal.  No respiratory distress. No chest wall tenderness. Breath sounds normal.  No wheezes, rales or rhonchi.  Abdomen:  Prominent bulge in the right inguinal region that is reducible and consistent with a right inguinal hernia. GU:  See above Musculoskeletal: Normal range of motion. Extremities are nontender. No cyanosis, edema or clubbing noted Lymphadenopathy: No cervical, preauricular, postauricular or axillary adenopathy is present Skin: Skin is warm and dry. No rash noted. No diaphoresis. No erythema. No pallor. Pscyh: Normal mood and affect. Behavior is normal. Judgment and thought content normal.   LABORATORY RESULTS: No results found for this or any previous visit  (from the past 48 hour(s)).   RADIOLOGY RESULTS: No results found.  Problem List: Patient Active Problem List   Diagnosis Date Noted  . Colon cancer 01/24/2013  . Cancer of middle lobe of lung 01/24/2013  . Rheumatoid arthritis(714.0) 01/24/2013  . BPH (benign prostatic hyperplasia) 01/24/2013  . HTN (hypertension), benign 01/24/2013  . Anemia 08/26/2011    Assessment & Plan: Prominent right inguinal hernia. Plan open right inguinal hernia repair with mesh at [facility] day surgery as an outpatient.    Matt B. Hassell Done, MD, [facility] [phone] beeper [phone]  05/04/2014 2:20 PM

## 2014-06-03 NOTE — Brief Op Note (Signed)
06/03/2014  3:03 PM  PATIENT:  [name] [name]  68 y.o. male  PRE-OPERATIVE DIAGNOSIS:  right inguinal hernia  POST-OPERATIVE DIAGNOSIS:  right inguinal hernia  PROCEDURE:  Procedure(s): HERNIA REPAIR INGUINAL ADULT (Right)  SURGEON:  Surgeon(s) and Role:    * Kaylyn Lim, MD - Primary  PHYSICIAN ASSISTANT:   ASSISTANTS: none   ANESTHESIA:   general  EBL:  Total I/O In: 1700 [I.V.:1700] Out: -   BLOOD ADMINISTERED:none  DRAINS: none   LOCAL MEDICATIONS USED:  MARCAINE     SPECIMEN:  No Specimen  DISPOSITION OF SPECIMEN:  N/A  COUNTS:  YES  TOURNIQUET:  * No tourniquets in log *  DICTATION: .Other Dictation: Dictation Number [id number]  PLAN OF CARE: Discharge to home after PACU  PATIENT DISPOSITION:  PACU - hemodynamically stable.   Delay start of Pharmacological VTE agent (>24hrs) due to surgical blood loss or risk of bleeding: not applicable

## 2014-06-03 NOTE — Anesthesia Procedure Notes (Signed)
Procedure Name: LMA Insertion Date/Time: 06/03/2014 1:24 PM Performed by: Faith Patricelli Pre-anesthesia Checklist: Patient identified, Emergency Drugs available, Suction available and Patient being monitored Patient Re-evaluated:Patient Re-evaluated prior to inductionOxygen Delivery Method: Circle System Utilized Preoxygenation: Pre-oxygenation with 100% oxygen Intubation Type: IV induction Ventilation: Mask ventilation without difficulty LMA: LMA inserted LMA Size: 4.0 Number of attempts: 1 Airway Equipment and Method: bite block Placement Confirmation: positive ETCO2 Tube secured with: Tape Dental Injury: Teeth and Oropharynx as per pre-operative assessment

## 2014-06-03 NOTE — Transfer of Care (Signed)
Immediate Anesthesia Transfer of Care Note  Patient: [name] [name]  Procedure(s) Performed: Procedure(s): HERNIA REPAIR INGUINAL ADULT (Right)  Patient Location: PACU  Anesthesia Type:General  Level of Consciousness: awake, alert , oriented and patient cooperative  Airway & Oxygen Therapy: Patient Spontanous Breathing and Patient connected to face mask oxygen  Post-op Assessment: Report given to PACU RN and Post -op Vital signs reviewed and stable  Post vital signs: Reviewed and stable  Complications: No apparent anesthesia complications

## 2014-06-03 NOTE — Anesthesia Preprocedure Evaluation (Addendum)
Anesthesia Evaluation  Patient identified by MRN, date of birth, ID band Patient awake    Reviewed: Allergy & Precautions, H&P , NPO status , Patient's Chart, lab work & pertinent test results  Airway Mallampati: II      Dental   Pulmonary former smoker,  breath sounds clear to auscultation        Cardiovascular hypertension, Rhythm:Regular Rate:Normal     Neuro/Psych    GI/Hepatic Neg liver ROS, GERD-  ,  Endo/Other    Renal/GU negative Renal ROS     Musculoskeletal   Abdominal   Peds  Hematology   Anesthesia Other Findings   Reproductive/Obstetrics                          Anesthesia Physical Anesthesia Plan  ASA: III  Anesthesia Plan: General   Post-op Pain Management:    Induction: Intravenous  Airway Management Planned: LMA  Additional Equipment:   Intra-op Plan:   Post-operative Plan:   Informed Consent: I have reviewed the patients History and Physical, chart, labs and discussed the procedure including the risks, benefits and alternatives for the proposed anesthesia with the patient or authorized representative who has indicated his/her understanding and acceptance.   Dental advisory given  Plan Discussed with: CRNA and Anesthesiologist  Anesthesia Plan Comments:         Anesthesia Quick Evaluation

## 2014-06-03 NOTE — Discharge Instructions (Signed)
Use ice packs as needed. Take the prescription pain med if necessary Use Ibuprofen to supplement the Norco (hydrocodone) If you cannot urinate, sit in a tub of warm water and urinate  Inguinal Hernia, Adult  Care After Refer to this sheet in the next few weeks. These discharge instructions provide you with general information on caring for yourself after you leave the hospital. Your caregiver may also give you specific instructions. Your treatment has been planned according to the most current medical practices available, but unavoidable complications sometimes occur. If you have any problems or questions after discharge, please call your caregiver. HOME CARE INSTRUCTIONS  Put ice on the operative site.  Put ice in a plastic bag.  Place a towel between your skin and the bag.  Leave the ice on for 15-20 minutes at a time, 03-04 times a day while awake.  Change bandages (dressings) as directed.  Keep the wound dry and clean. The wound may be washed gently with soap and water. Gently blot or dab the wound dry. It is okay to take showers 24 to 48 hours after surgery. Do not take baths, use swimming pools, or use hot tubs for 10 days, or as directed by your caregiver.  Only take over-the-counter or prescription medicines for pain, discomfort, or fever as directed by your caregiver.  Continue your normal diet as directed.  Do not lift anything more than 10 pounds or play contact sports for 3 weeks, or as directed. SEEK MEDICAL CARE IF:  There is redness, swelling, or increasing pain in the wound.  There is fluid (pus) coming from the wound.  There is drainage from a wound lasting longer than 1 day.  You have an oral temperature above 102 F (38.9 C).  You notice a bad smell coming from the wound or dressing.  The wound breaks open after the stitches (sutures) have been removed.  You notice increasing pain in the shoulders (shoulder strap areas).  You develop dizzy episodes or  fainting while standing.  You feel sick to your stomach (nauseous) or throw up (vomit). SEEK IMMEDIATE MEDICAL CARE IF:  You develop a rash.  You have difficulty breathing.  You develop a reaction or have side effects to medicines you were given. MAKE SURE YOU:   Understand these instructions.  Will watch your condition.  Will get help right away if you are not doing well or get worse. Document Released: 10/03/2006 Document Revised: 11/25/2011 Document Reviewed: 08/02/2009 [facility] Patient Information 2015 [place], [place]. This information is not intended to replace advice given to you by your health care provider. Make sure you discuss any questions you have with your health care provider.    Post Anesthesia Home Care Instructions  Activity: Get plenty of rest for the remainder of the day. A responsible adult should stay with you for 24 hours following the procedure.  For the next 24 hours, DO NOT: -Drive a car -Paediatric nurse -Drink alcoholic beverages -Take any medication unless instructed by your physician -Make any legal decisions or sign important papers.  Meals: Start with liquid foods such as gelatin or soup. Progress to regular foods as tolerated. Avoid greasy, spicy, heavy foods. If nausea and/or vomiting occur, drink only clear liquids until the nausea and/or vomiting subsides. Call your physician if vomiting continues.  Special Instructions/Symptoms: Your throat may feel dry or sore from the anesthesia or the breathing tube placed in your throat during surgery. If this causes discomfort, gargle with warm salt water. The discomfort  should disappear within 24 hours.

## 2014-06-04 NOTE — Anesthesia Postprocedure Evaluation (Signed)
  Anesthesia Post-op Note  Patient: [name] [name]  Procedure(s) Performed: Procedure(s): HERNIA REPAIR INGUINAL ADULT (Right)  Patient Location: PACU  Anesthesia Type:General  Level of Consciousness: awake  Airway and Oxygen Therapy: Patient Spontanous Breathing  Post-op Pain: mild  Post-op Assessment: Post-op Vital signs reviewed  Post-op Vital Signs: Reviewed  Last Vitals:  Filed Vitals:   06/03/14 1550  BP: 125/70  Pulse: 93  Temp: 36.5 C  Resp: 22    Complications: No apparent anesthesia complications

## 2014-06-06 NOTE — Op Note (Signed)
NAME:  [name] [name], [name] [name] NO.:  [id number]  MEDICAL RECORD NO.:  [id number]  LOCATION:                               FACILITY:  [facility]  PHYSICIAN:  Isabel Caprice. Hassell Done, MD  DATE OF BIRTH:  1946/04/16  DATE OF PROCEDURE:  06/03/2014 DATE OF DISCHARGE:  06/03/2014                              OPERATIVE REPORT   PREOPERATIVE DIAGNOSIS:  Right inguinal hernia.  POSTOPERATIVE DIAGNOSIS:  Right indirect inguinal hernia.  PROCEDURE:  Open right inguinal herniorrhaphy with Marlex type mesh.  SURGEON:  Isabel Caprice. Hassell Done, MD  ASSISTANT:  None.  ANESTHESIA:  General.  DESCRIPTION OF PROCEDURE:  [name] was taken to room 5 at [facility] and given general anesthesia.  The abdomen was prepped with PCMX and draped sterilely.  He had previously been marked by the patient and by me and a time-out was performed.  I then made a small oblique incision and carried this down to his fat layer to the external oblique. I incised it along the fibers and opened down through the external ring. I mobilized the cord and put a Penrose around it including the ilioinguinal nerve branch.  Anteromedially, there was a sac which I saw and I was able to dissect free easily from the cord structures and opened, put my finger in to complete the dissection, and then I did a high ligation with 2-0 silk and reduced that into the abdomen.  I then closed the ring medially with interrupted 2-0 Prolene's.  Following this, I repaired the floor with a piece of Marlex mesh cut to fit and sewn to the ilioinguinal ligament with a running 2-0 Prolene.  It was sewn medially with a running 2-0 Prolene.  It was cut and brought around the cord structures and sewn to itself with 2-0 Prolene.  The entire area and field was injected with 10 mL of 0.5% Marcaine plain.  The wound was irrigated and then the wound was closed with 4-0 Vicryl subcutaneously and then with a running subcuticular 5-0 Vicryl.   The patient tolerated the procedure well and was taken to recovery room in satisfactory condition.     Isabel Caprice Hassell Done, MD     MBM/MEDQ  D:  06/03/2014  T:  06/03/2014  Job:  [id number]

## 2014-06-06 NOTE — Op Note (Deleted)
NAME:  [name] [name], [name] [name] NO.:  [id number]  MEDICAL RECORD NO.:  [id number]  LOCATION:                               FACILITY:  [place]  PHYSICIAN:  Isabel Caprice. Hassell Done, MD  DATE OF BIRTH:  02-25-1946  DATE OF PROCEDURE:  06/03/2014 DATE OF DISCHARGE:  06/03/2014                              OPERATIVE REPORT   PREOPERATIVE DIAGNOSIS:  Right inguinal hernia.  POSTOPERATIVE DIAGNOSIS:  Right indirect inguinal hernia.  PROCEDURE:  Open right inguinal herniorrhaphy with Marlex type mesh.  SURGEON:  Isabel Caprice. Hassell Done, MD  ASSISTANT:  None.  ANESTHESIA:  General.  DESCRIPTION OF PROCEDURE:  [name] was taken to room 5 at [facility] and given general anesthesia.  The abdomen was prepped with PCMX and draped sterilely.  He had previously been marked by the patient and by me and a time-out was performed.  I then made a small oblique incision and carried this down to his fat layer to the external oblique. I incised it along the fibers and opened down through the external ring. I mobilized the cord and put a Penrose around it including the ilioinguinal nerve branch.  Anteromedially, there was a sac which I saw and I was able to dissect free easily from the cord structures and opened, put my finger in to complete the dissection, and then I did a high ligation with 2-0 silk and reduced that into the abdomen.  I then closed the ring medially with interrupted 2-0 Prolene's.  Following this, I repaired the floor with a piece of Marlex mesh cut to fit and sewn to the ilioinguinal ligament with a running 2-0 Prolene.  It was sewn medially with a running 2-0 Prolene.  It was cut and brought around the cord structures and sewn to itself with 2-0 Prolene.  The entire area and field was injected with 10 mL of 0.5% Marcaine plain.  The wound was irrigated and then the wound was closed with 4-0 Vicryl subcutaneously and then with a running subcuticular 5-0 Vicryl.   The patient tolerated the procedure well and was taken to recovery room in satisfactory condition.     Isabel Caprice Hassell Done, MD     MBM/MEDQ  D:  06/03/2014  T:  06/03/2014  Job:  [id number]

## 2014-06-07 ENCOUNTER — Encounter (HOSPITAL_BASED_OUTPATIENT_CLINIC_OR_DEPARTMENT_OTHER): Payer: Self-pay | Admitting: Surgery

## 2014-09-11 ENCOUNTER — Ambulatory Visit: Payer: Self-pay | Admitting: Family Medicine

## 2014-11-21 ENCOUNTER — Ambulatory Visit: Payer: Self-pay | Admitting: Rheumatology

## 2015-01-18 ENCOUNTER — Other Ambulatory Visit (HOSPITAL_BASED_OUTPATIENT_CLINIC_OR_DEPARTMENT_OTHER): Payer: Medicare Other

## 2015-01-18 ENCOUNTER — Ambulatory Visit (HOSPITAL_COMMUNITY)
Admission: RE | Admit: 2015-01-18 | Discharge: 2015-01-18 | Disposition: A | Discharge status: Home / Self Care | Payer: Medicare Other | Source: Ambulatory Visit | Attending: Internal Medicine | Admitting: Internal Medicine

## 2015-01-18 ENCOUNTER — Telehealth: Payer: Self-pay | Admitting: *Deleted

## 2015-01-18 DIAGNOSIS — Z9221 Personal history of antineoplastic chemotherapy: Secondary | ICD-10-CM | POA: Diagnosis not present

## 2015-01-18 DIAGNOSIS — C189 Malignant neoplasm of colon, unspecified: Secondary | ICD-10-CM | POA: Diagnosis not present

## 2015-01-18 DIAGNOSIS — C342 Malignant neoplasm of middle lobe, bronchus or lung: Secondary | ICD-10-CM | POA: Diagnosis not present

## 2015-01-18 DIAGNOSIS — Z85118 Personal history of other malignant neoplasm of bronchus and lung: Secondary | ICD-10-CM | POA: Diagnosis present

## 2015-01-18 DIAGNOSIS — Z923 Personal history of irradiation: Secondary | ICD-10-CM | POA: Insufficient documentation

## 2015-01-18 DIAGNOSIS — Z85038 Personal history of other malignant neoplasm of large intestine: Secondary | ICD-10-CM | POA: Diagnosis not present

## 2015-01-18 LAB — CBC WITH DIFFERENTIAL/PLATELET
BASO%: 0.2 % (ref 0.0–2.0)
Basophils Absolute: 0 10*3/uL (ref 0.0–0.1)
EOS%: 0.1 % (ref 0.0–7.0)
Eosinophils Absolute: 0 10*3/uL (ref 0.0–0.5)
HEMATOCRIT: 42.9 % (ref 38.4–49.9)
HGB: 14.5 g/dL (ref 13.0–17.1)
LYMPH#: 0.8 10*3/uL — AB (ref 0.9–3.3)
LYMPH%: 14.5 % (ref 14.0–49.0)
MCH: 32.5 pg (ref 27.2–33.4)
MCHC: 33.9 g/dL (ref 32.0–36.0)
MCV: 95.8 fL (ref 79.3–98.0)
MONO#: 0.6 10*3/uL (ref 0.1–0.9)
MONO%: 11.2 % (ref 0.0–14.0)
NEUT#: 4 10*3/uL (ref 1.5–6.5)
NEUT%: 74 % (ref 39.0–75.0)
Platelets: 143 10*3/uL (ref 140–400)
RBC: 4.48 10*6/uL (ref 4.20–5.82)
RDW: 14.1 % (ref 11.0–14.6)
WBC: 5.4 10*3/uL (ref 4.0–10.3)

## 2015-01-18 LAB — COMPREHENSIVE METABOLIC PANEL (CC13)
ALK PHOS: 68 U/L (ref 40–150)
ALT: 15 U/L (ref 0–55)
AST: 15 U/L (ref 5–34)
Albumin: 3.3 g/dL — ABNORMAL LOW (ref 3.5–5.0)
Anion Gap: 12 mEq/L — ABNORMAL HIGH (ref 3–11)
BILIRUBIN TOTAL: 1.77 mg/dL — AB (ref 0.20–1.20)
BUN: 7.8 mg/dL (ref 7.0–26.0)
CALCIUM: 9 mg/dL (ref 8.4–10.4)
CHLORIDE: 100 meq/L (ref 98–109)
CO2: 23 mEq/L (ref 22–29)
Creatinine: 0.8 mg/dL (ref 0.7–1.3)
EGFR: >90 mL/min/{1.73_m2} (ref >90)
Glucose: 113 mg/dl (ref 70–140)
Potassium: 3.8 mEq/L (ref 3.5–5.1)
Sodium: 135 mEq/L — ABNORMAL LOW (ref 136–145)
Total Protein: 6.4 g/dL (ref 6.4–8.3)

## 2015-01-18 LAB — CEA: CEA: 4.1 ng/mL (ref 0.0–5.0)

## 2015-01-18 NOTE — Telephone Encounter (Signed)
TC from pt's wife regarding lab appt for next week, 01/25/15. She is wanting to have labs done today since has to be here for a scan/ CXR  At 1pm. She would like to come to do labs after Radiology appt.  He is scheduled for CBC w/diff, CEA and CMET.

## 2015-01-25 ENCOUNTER — Telehealth: Payer: Self-pay | Admitting: Internal Medicine

## 2015-01-25 ENCOUNTER — Ambulatory Visit (HOSPITAL_BASED_OUTPATIENT_CLINIC_OR_DEPARTMENT_OTHER): Payer: Medicare Other | Admitting: Internal Medicine

## 2015-01-25 ENCOUNTER — Encounter: Payer: Self-pay | Admitting: Internal Medicine

## 2015-01-25 ENCOUNTER — Other Ambulatory Visit: Payer: Medicare Other

## 2015-01-25 DIAGNOSIS — C189 Malignant neoplasm of colon, unspecified: Secondary | ICD-10-CM

## 2015-01-25 DIAGNOSIS — C342 Malignant neoplasm of middle lobe, bronchus or lung: Secondary | ICD-10-CM

## 2015-01-25 DIAGNOSIS — Z85038 Personal history of other malignant neoplasm of large intestine: Secondary | ICD-10-CM | POA: Diagnosis not present

## 2015-01-25 DIAGNOSIS — Z85118 Personal history of other malignant neoplasm of bronchus and lung: Secondary | ICD-10-CM

## 2015-01-25 NOTE — Progress Notes (Signed)
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 (Pathologic T3, N0, M0). 1.5 cm primary. 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 4.2 cm cavitary right middle lobe lesion with associated right hilar adenopathy which was PET avid.   PRIOR THERAPY: 1) status post right hemicolectomy 06/09/2002. (Pathologic T3, N0, M0). 1.5 cm primary. No vascular or lymphatic invasion followed by  adjuvant chemotherapy with 6 cycles of 5-fluorouracil plus leucovorin. 2) status post right middle lobe resection by Dr. Arlyce Dice on 08/20/2007 following neoadjuvant chemoradiation. He received weekly carboplatin and Taxol during radiation. Radiation given between August 4 and 06/08/2007. 36 gray to the primary tumor. Boost to 45 gray. 2 additional soft tissue deposits in the right middle lobe satellite lesions versus second primary also treated with a final total tumor dose of 65 gray.  CURRENT THERAPY: Observation.  INTERVAL HISTORY: [name] [name] 69 y.o. male returns to the clinic today for annual followup visit accompanied by his wife. He is feeling fine today with no specific complaints. He had a hernia repair recently by Dr. Hassell Done. He denied having any significant chest pain, shortness of breath, cough or hemoptysis. He denied having any significant weight loss or night sweats. The patient denied having any rectal bleeding. He had repeat CBC, comprehensive metabolic panel, CEA as well as chest x-ray performed recently and he is here for evaluation and discussion of  his lab and imaging results.  MEDICAL HISTORY: Past Medical History  Diagnosis Date  . Cancer     lung/colon ca  . Colon cancer 01/24/2013    T3N0M0  right colon  R hemicolectomy   . 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 BPH (benign prostatic hyperplasia) 01/24/2013  . HTN (hypertension), benign 01/24/2013  . GERD (gastroesophageal reflux disease)   . Raynauds phenomenon   . Rheumatoid arthritis(714.0) 01/24/2013    Since age 56    ALLERGIES:  is allergic to aspirin; diphenhydramine hcl; and remicade.  MEDICATIONS:  Current Outpatient Prescriptions  Medication Sig Dispense Refill  . acetaminophen (TYLENOL) 500 MG tablet Take 1,000 mg by mouth every 6 (six) hours as needed for mild pain or moderate pain.    Marland Kitchen amLODipine (NORVASC) 2.5 MG tablet Take 2.5 mg by mouth daily with lunch.     . Calcium Carbonate-Vitamin D (CALCIUM + D PO) Take 600 mg by mouth daily.    Marland Kitchen HYDROcodone-acetaminophen (NORCO) 5-325 MG per tablet Take 1 tablet by mouth every 4 (four) hours as needed for moderate pain. 30 tablet 0  . leflunomide (ARAVA) 10 MG tablet Take 10 mg by mouth daily.     Marland Kitchen omeprazole (PRILOSEC) 20 MG capsule Take 20 mg by mouth daily.     Marland Kitchen oxyCODONE-acetaminophen (PERCOCET/ROXICET) 5-325 MG per tablet   0  . predniSONE (DELTASONE) 5 MG tablet Take 5 mg by mouth daily with breakfast.    . tamsulosin (FLOMAX) 0.4 MG CAPS capsule Take 0.4 mg by mouth daily.     . vitamin C (ASCORBIC ACID)  500 MG tablet Take 500 mg by mouth daily.    . vitamin E 400 UNIT capsule Take 400 Units by mouth daily.     No current facility-administered medications for this visit.    SURGICAL HISTORY:  Past Surgical History  Procedure Laterality Date  . Gastrectomy  1974    bleeding ulcers  . Lumbar disc surgery  2003  . Lung lobectomy  2007    right  . Tendons ruptured  2010    right-hand  . Colectomy  2004    right-hemi    . Appendectomy    . Inguinal hernia repair Right 06/03/2014    Procedure: HERNIA REPAIR INGUINAL ADULT;  Surgeon: Kaylyn Lim, MD;  Location: [facility];  Service: General;  Laterality: Right;    REVIEW OF SYSTEMS:  A comprehensive review of systems was negative.   PHYSICAL EXAMINATION: General appearance: alert, cooperative and no distress Head: Normocephalic, without obvious abnormality, atraumatic Neck: no adenopathy, no JVD, supple, symmetrical, trachea midline and thyroid not enlarged, symmetric, no tenderness/mass/nodules Lymph nodes: Cervical, supraclavicular, and axillary nodes normal. Resp: clear to auscultation bilaterally Back: symmetric, no curvature. ROM normal. No CVA tenderness. Cardio: regular rate and rhythm, S1, S2 normal, no murmur, click, rub or gallop GI: soft, non-tender; bowel sounds normal; no masses,  no organomegaly Extremities: extremities normal, atraumatic, no cyanosis or edema Neurologic: Alert and oriented X 3, normal strength and tone. Normal symmetric reflexes. Normal coordination and gait  ECOG PERFORMANCE STATUS: 0 - Asymptomatic  There were no vitals taken for this visit.  LABORATORY DATA: Lab Results  Component Value Date   WBC 5.4 01/18/2015   HGB 14.5 01/18/2015   HCT 42.9 01/18/2015   MCV 95.8 01/18/2015   PLT 143 01/18/2015      Chemistry      Component Value Date/Time   NA 135* 01/18/2015 1356   NA 134* 05/30/2014 1405   NA 138 02/21/2012 1241   K 3.8 01/18/2015 1356   K 3.9 05/30/2014 1405   K 4.5 02/21/2012 1241   CL 97 05/30/2014 1405   CL 98 01/15/2013 1454   CL 97* 02/21/2012 1241   CO2 23 01/18/2015 1356   CO2 24 05/30/2014 1405   CO2 30 02/21/2012 1241   BUN 7.8 01/18/2015 1356   BUN 6 05/30/2014 1405   BUN 5* 02/21/2012 1241   CREATININE 0.8 01/18/2015 1356   CREATININE 0.62 05/30/2014 1405   CREATININE 1.0 02/21/2012 1241      Component Value Date/Time   CALCIUM 9.0 01/18/2015 1356   CALCIUM 8.9  05/30/2014 1405   CALCIUM 9.1 02/21/2012 1241   ALKPHOS 68 01/18/2015 1356   ALKPHOS 50 02/20/2011 1308   ALKPHOS 51 08/23/2010 1251   AST 15 01/18/2015 1356   AST 21 02/20/2011 1308   AST 21 08/23/2010 1251   ALT 15 01/18/2015 1356   ALT 20 02/20/2011 1308   ALT 21 08/23/2010 1251   BILITOT 1.77* 01/18/2015 1356   BILITOT 1.40 02/20/2011 1308   BILITOT 0.5 08/23/2010 1251       RADIOGRAPHIC STUDIES: No results found.  ASSESSMENT AND PLAN: This is a very pleasant 69 years old white male with history of stage II colon adenocarcinoma diagnosed in 2003 in addition to stage III non-small cell lung cancer diagnosed in 2008 status post dissection as well as adjuvant therapy. He has been observation since that time with no significant evidence for disease recurrence. His recent blood work as well as the  chest x-ray showed no concerning evidence for disease recurrence. I discussed the lab and imaging results with the patient and his wife. The patient will continue on observation with repeat CBC, comprehensive metabolic panel, CEA and chest x-ray in one year. He was advised to call immediately if he has any concerning symptoms in the interval. The patient voices understanding of current disease status and treatment options and is in agreement with the current care plan.  All questions were answered. The patient knows to call the clinic with any problems, questions or concerns. We can certainly see the patient much sooner if necessary.  Disclaimer: This note was dictated with voice recognition software. Similar sounding words can inadvertently be transcribed and may not be corrected upon review.

## 2015-01-25 NOTE — Telephone Encounter (Signed)
Gave and prnted dappt sched and avs fo rpt for May 2017

## 2015-08-15 ENCOUNTER — Encounter: Payer: Self-pay | Admitting: Emergency Medicine

## 2015-08-15 ENCOUNTER — Ambulatory Visit
Admission: EM | Admit: 2015-08-15 | Discharge: 2015-08-15 | Disposition: A | Discharge status: Home / Self Care | Payer: Medicare Other | Attending: Family Medicine | Admitting: Family Medicine

## 2015-08-15 DIAGNOSIS — R05 Cough: Secondary | ICD-10-CM | POA: Diagnosis not present

## 2015-08-15 DIAGNOSIS — R059 Cough, unspecified: Secondary | ICD-10-CM

## 2015-08-15 MED ORDER — BENZONATATE 100 MG PO CAPS: 100.0000 mg | ORAL_CAPSULE | Freq: Three times a day (TID) | ORAL | Status: DC | PRN

## 2015-08-15 MED ORDER — AMOXICILLIN-POT CLAVULANATE 875-125 MG PO TABS: 1.0000 | ORAL_TABLET | Freq: Two times a day (BID) | ORAL | Status: AC

## 2015-08-15 NOTE — Discharge Instructions (Signed)
Please call your cancer doctor and ask his nurse if you might try Zyrtec or Claritin.... And if you could try Flonase nasal spray    To help with the congestion and nasal drip....  Use Robitussin Dm 11 tsp every four hours to thin the mucous   I have renewed your cough medicine  :)    Antibiotic is called Augmentin 875 mg please take twice daily until course completed   Cough, Adult A cough helps to clear your throat and lungs. A cough may last only 2-3 weeks (acute), or it may last longer than 8 weeks (chronic). Many different things can cause a cough. A cough may be a sign of an illness or another medical condition. HOME CARE  Pay attention to any changes in your cough.  Take medicines only as told by your doctor.  If you were prescribed an antibiotic medicine, take it as told by your doctor. Do not stop taking it even if you start to feel better.  Talk with your doctor before you try using a cough medicine.  Drink enough fluid to keep your pee (urine) clear or pale yellow.  If the air is dry, use a cold steam vaporizer or humidifier in your home.  Stay away from things that make you cough at work or at home.  If your cough is worse at night, try using extra pillows to raise your head up higher while you sleep.  Do not smoke, and try not to be around smoke. If you need help quitting, ask your doctor.  Do not have caffeine.  Do not drink alcohol.  Rest as needed. GET HELP IF:  You have new problems (symptoms).  You cough up yellow fluid (pus).  Your cough does not get better after 2-3 weeks, or your cough gets worse.  Medicine does not help your cough and you are not sleeping well.  You have pain that gets worse or pain that is not helped with medicine.  You have a fever.  You are losing weight and you do not know why.  You have night sweats. GET HELP RIGHT AWAY IF:  You cough up blood.  You have trouble breathing.  Your heartbeat is very fast.   This  information is not intended to replace advice given to you by your health care provider. Make sure you discuss any questions you have with your health care provider.   Document Released: 05/16/2011 Document Revised: 05/24/2015 Document Reviewed: 11/09/2014 Elsevier Interactive Patient Education [employer].

## 2015-08-15 NOTE — ED Provider Notes (Signed)
CSN: [id number]     Arrival date & time 08/15/15  1540 History   First MD Initiated Contact with Patient 08/15/15 1653     Chief Complaint  Patient presents with  . Cough   (Consider location/radiation/quality/duration/timing/severity/associated sxs/prior Treatment) HPI  69 yo M with 2 day history of cough and nasal congestion, rhinorrhea. Cough is most aggravating. No fever. Anxious to have antibiotics for URI.  Also loves Benzonatate. Wife has had similar and on Z pak Hx Lung Cancer with RML 8 years ago; Former smoker; multiple months of rad Rx berfore surgery Continues on Once monthly injectable cancer therapy and prednisone 5 mg Hx of colon cancer with right colectomy 14 years ago Has RA Followed by Dr Jefm Bryant  Past Medical History  Diagnosis Date  . Cancer ([place])     lung/colon ca  . Colon cancer ([facility]) 01/24/2013    T3N0M0  right colon  R hemicolectomy   . Cancer of middle lobe of lung ([facility]) 01/24/2013    4.5 cm RML w associated R hilar adenopathy S/P chemo/RT then Surgery 8/08  . BPH (benign prostatic hyperplasia) 01/24/2013  . HTN (hypertension), benign 01/24/2013  . GERD (gastroesophageal reflux disease)   . Raynauds phenomenon   . Rheumatoid arthritis(714.0) 01/24/2013    Since age 40   Past Surgical History  Procedure Laterality Date  . Gastrectomy  1974    bleeding ulcers  . Lumbar disc surgery  2003  . Lung lobectomy  2007    right  . Tendons ruptured  2010    right-hand  . Colectomy  2004    right-hemi  . Appendectomy    . Inguinal hernia repair Right 06/03/2014    Procedure: HERNIA REPAIR INGUINAL ADULT;  Surgeon: Kaylyn Lim, MD;  Location: [facility];  Service: General;  Laterality: Right;   Family History  Problem Relation Age of Onset  . Colon cancer Neg Hx   . Pancreatic cancer Neg Hx   . Stomach cancer Neg Hx   . Cancer Sister   . Cancer Brother    Social History  Substance Use Topics  . Smoking status: Former Smoker    Quit date:  05/30/2006  . Smokeless tobacco: Never Used  . Alcohol Use: 11.4 oz/week    12 Cans of beer, 7 Shots of liquor per week    Review of Systems Constitutional: No fever.  Eyes: No visual changes. ENT:No sore throat. Cardiovascular:Negative for chest pain/palpitations Respiratory: Negative for shortness of breath Gastrointestinal: No abdominal pain. No nausea,vomiting, diarrhea Genitourinary: Negative for dysuria. Normal urination. Musculoskeletal: Negative for back pain. FROM extremities without pain Skin: Negative for rash Neurological: Negative for headache, focal weakness or numbness  Allergies  Aspirin; Diphenhydramine hcl; and Remicade  Home Medications   Prior to Admission medications   Medication Sig Start Date End Date Taking? Authorizing Provider  Abatacept (ORENCIA IV) Inject into the vein every 30 (thirty) days.   Yes Historical Provider, MD  acetaminophen (TYLENOL) 500 MG tablet Take 1,000 mg by mouth every 6 (six) hours as needed for mild pain or moderate pain.    Historical Provider, MD  amLODipine (NORVASC) 2.5 MG tablet Take 2.5 mg by mouth daily with lunch.     Historical Provider, MD  amoxicillin-clavulanate (AUGMENTIN) 875-125 MG tablet Take 1 tablet by mouth 2 (two) times daily. 08/15/15 08/25/15  Jan Fireman, PA-C  benzonatate (TESSALON) 100 MG capsule Take 1 capsule (100 mg total) by mouth 3 (three) times daily as needed.  08/15/15   Jan Fireman, PA-C  Calcium Carbonate-Vitamin D (CALCIUM + D PO) Take 600 mg by mouth daily.    Historical Provider, MD  HYDROcodone-acetaminophen (NORCO) 5-325 MG per tablet Take 1 tablet by mouth every 4 (four) hours as needed for moderate pain. 06/03/14   Johnathan Hausen, MD  leflunomide (ARAVA) 10 MG tablet Take 10 mg by mouth daily.     Historical Provider, MD  omeprazole (PRILOSEC) 20 MG capsule Take 20 mg by mouth daily.     Historical Provider, MD  oxyCODONE-acetaminophen (PERCOCET/ROXICET) 5-325 MG per tablet  11/08/14    Historical Provider, MD  predniSONE (DELTASONE) 5 MG tablet Take 5 mg by mouth daily with breakfast.    Historical Provider, MD  tamsulosin (FLOMAX) 0.4 MG CAPS capsule Take 0.4 mg by mouth daily.  06/12/13   Historical Provider, MD  vitamin C (ASCORBIC ACID) 500 MG tablet Take 500 mg by mouth daily.    Historical Provider, MD  vitamin E 400 UNIT capsule Take 400 Units by mouth daily.    Historical Provider, MD   Meds Ordered and Administered this Visit  Medications - No data to display  BP 144/91 mmHg  Pulse 115  Temp(Src) 97.5 F (36.4 C) (Tympanic)  Resp 16  Ht '5\' 8"'$  (1.727 m)  Wt 185 lb (83.915 kg)  BMI 28.14 kg/m2  SpO2 96% No data found.   Physical Exam  General: NAD, does not appear toxic, congestion, mild malaise HEENT:normocephalic,atraumatic, mucous membranes moist,grossly normal hearing, canals clear, TMs neg Eyes: EOMI, conjunctiva clear, conjugate gaze Neck: supple,no lymphadenopathy Resp : CT A, bilat; normal respiratory effort, rate 16  Sp02 96% Card : RRR repeat pulse 100- patient reports he commonly has elevated pulse, and has had cold medicine this morning Abd:  Not distended Skin: no rash, skin intact MSK: no deformities, ambulatory without assistance Neuro : good attention,recall-good memory, no focal neuro deficits Psych: speech and behavior appropriate   ED Course  Procedures (including critical care time)  Labs Review Labs Reviewed - No data to display  Imaging Review No results found.  Discussed with Dr Alveta Heimlich- currently afebrile , but symptomatic and compromised. He recommends 10 days of Augmentin Treat symptoms Increased hydration by mouth as well as steamy showers  MDM   1. Cough    Plan:  Diagnosis reviewed with patient Decision to proceed with antibiotics discussed, patient to notify PCP and [place] offices of same- triage nurse. Rx as per orders;  benefits, risks, potential side effects reviewed   Recommend supportive treatment with  cyclic tylenol and ibuprofen   Questions fielded, expectations and recommendations reviewed .Discussed follow up and return parameters including no resolution or any worsening condition..  Patient expresses understanding  and agrees to plan. Will return to [facility] with questions, concerns or exacerbation.   Discharge Medication List as of 08/15/2015  5:19 PM    START taking these medications   Details  amoxicillin-clavulanate (AUGMENTIN) 875-125 MG tablet Take 1 tablet by mouth 2 (two) times daily., Starting 08/15/2015, Until Fri 08/25/15, Print    benzonatate (TESSALON) 100 MG capsule Take 1 capsule (100 mg total) by mouth 3 (three) times daily as needed., Starting 08/15/2015, Until Discontinued, Print        Jan Fireman, PA-C 08/16/15 1237

## 2015-08-15 NOTE — ED Notes (Signed)
Patient c/o cough and chest congestion, and runny nose nose for 2 days.

## 2015-08-16 ENCOUNTER — Encounter: Payer: Self-pay | Admitting: Physician Assistant

## 2015-08-24 ENCOUNTER — Other Ambulatory Visit: Payer: Self-pay | Admitting: Urology

## 2015-10-17 ENCOUNTER — Encounter (HOSPITAL_COMMUNITY)
Admission: RE | Admit: 2015-10-17 | Discharge: 2015-10-17 | Disposition: A | Discharge status: Home / Self Care | Payer: Medicare Other | Source: Ambulatory Visit | Attending: Urology | Admitting: Urology

## 2015-10-17 ENCOUNTER — Encounter (HOSPITAL_COMMUNITY): Payer: Self-pay

## 2015-10-17 ENCOUNTER — Ambulatory Visit (HOSPITAL_COMMUNITY)
Admission: RE | Admit: 2015-10-17 | Discharge: 2015-10-17 | Disposition: A | Discharge status: Home / Self Care | Payer: Medicare Other | Source: Ambulatory Visit | Attending: Anesthesiology | Admitting: Anesthesiology

## 2015-10-17 DIAGNOSIS — J988 Other specified respiratory disorders: Secondary | ICD-10-CM

## 2015-10-17 DIAGNOSIS — R918 Other nonspecific abnormal finding of lung field: Secondary | ICD-10-CM | POA: Insufficient documentation

## 2015-10-17 DIAGNOSIS — R05 Cough: Secondary | ICD-10-CM

## 2015-10-17 DIAGNOSIS — I10 Essential (primary) hypertension: Secondary | ICD-10-CM | POA: Insufficient documentation

## 2015-10-17 DIAGNOSIS — R059 Cough, unspecified: Secondary | ICD-10-CM

## 2015-10-17 LAB — CBC
HEMATOCRIT: 41.7 % (ref 39.0–52.0)
HEMOGLOBIN: 14.4 g/dL (ref 13.0–17.0)
MCH: 30.8 pg (ref 26.0–34.0)
MCHC: 34.5 g/dL (ref 30.0–36.0)
MCV: 89.1 fL (ref 78.0–100.0)
Platelets: 179 10*3/uL (ref 150–400)
RBC: 4.68 MIL/uL (ref 4.22–5.81)
RDW: 14.8 % (ref 11.5–15.5)
WBC: 6.2 10*3/uL (ref 4.0–10.5)

## 2015-10-17 LAB — BASIC METABOLIC PANEL
ANION GAP: 9 (ref 5–15)
BUN: 7 mg/dL (ref 6–20)
CHLORIDE: 100 mmol/L — AB (ref 101–111)
CO2: 26 mmol/L (ref 22–32)
CREATININE: 0.78 mg/dL (ref 0.61–1.24)
Calcium: 9.1 mg/dL (ref 8.9–10.3)
GFR calc non Af Amer: >60 mL/min (ref >60)
Glucose, Bld: 102 mg/dL — ABNORMAL HIGH (ref 65–99)
Potassium: 4 mmol/L (ref 3.5–5.1)
Sodium: 135 mmol/L (ref 135–145)

## 2015-10-17 NOTE — Patient Instructions (Signed)
ZEBULIN SIEGEL  10/17/2015   Your procedure is scheduled on: Thursday 10/19/2015  Report to [facility] Main  Entrance take [facility]  elevators to 3rd floor to  [place] at   0900 AM.  Call this number if you have problems the morning of surgery [phone]   Remember: ONLY 1 PERSON MAY GO WITH YOU TO SHORT STAY TO GET  READY MORNING OF [place].   Do not eat food or drink liquids :After Midnight.     Take these medicines the morning of surgery with A SIP OF WATER: Amlodipine, Prednisone                                You may not have any metal on your body including hair pins and              piercings  Do not wear jewelry, make-up, lotions, powders or perfumes, deodorant             Do not wear nail polish.  Do not shave  48 hours prior to surgery.              Men may shave face and neck.   Do not bring valuables to the hospital. [place].  Contacts, dentures or bridgework may not be worn into surgery.  Leave suitcase in the car. After surgery it may be brought to your room.     .             Please read over the following fact sheets you were given: _____________________________________________________________________             [facility] - Preparing for Surgery Before surgery, you can play an important role.  Because skin is not sterile, your skin needs to be as free of germs as possible.  You can reduce the number of germs on your skin by washing with CHG (chlorahexidine gluconate) soap before surgery.  CHG is an antiseptic cleaner which kills germs and bonds with the skin to continue killing germs even after washing. Please DO NOT use if you have an allergy to CHG or antibacterial soaps.  If your skin becomes reddened/irritated stop using the CHG and inform your nurse when you arrive at Short Stay. Do not shave (including legs and underarms) for at least 48 hours prior to the first  CHG shower.  You may shave your face/neck. Please follow these instructions carefully:  1.  Shower with CHG Soap the night before surgery and the  morning of Surgery.  2.  If you choose to wash your hair, wash your hair first as usual with your  normal  shampoo.  3.  After you shampoo, rinse your hair and body thoroughly to remove the  shampoo.                           4.  Use CHG as you would any other liquid soap.  You can apply chg directly  to the skin and wash                       Gently with a scrungie or clean washcloth.  5.  Apply the CHG Soap to your body ONLY FROM THE NECK DOWN.   Do not use on face/ open                           Wound or open sores. Avoid contact with eyes, ears mouth and genitals (private parts).                       Wash face,  Genitals (private parts) with your normal soap.             6.  Wash thoroughly, paying special attention to the area where your surgery  will be performed.  7.  Thoroughly rinse your body with warm water from the neck down.  8.  DO NOT shower/wash with your normal soap after using and rinsing off  the CHG Soap.                9.  Pat yourself dry with a clean towel.            10.  Wear clean pajamas.            11.  Place clean sheets on your bed the night of your first shower and do not  sleep with pets. Day of Surgery : Do not apply any lotions/deodorants the morning of surgery.  Please wear clean clothes to the hospital/surgery center.  FAILURE TO FOLLOW THESE INSTRUCTIONS MAY RESULT IN THE CANCELLATION OF YOUR SURGERY PATIENT SIGNATURE_________________________________  NURSE SIGNATURE__________________________________  ________________________________________________________________________   Adam Phenix  An incentive spirometer is a tool that can help keep your lungs clear and active. This tool measures how well you are filling your lungs with each breath. Taking long deep breaths may help reverse or decrease the  chance of developing breathing (pulmonary) problems (especially infection) following:  A long period of time when you are unable to move or be active. BEFORE THE PROCEDURE   If the spirometer includes an indicator to show your best effort, your nurse or respiratory therapist will set it to a desired goal.  If possible, sit up straight or lean slightly forward. Try not to slouch.  Hold the incentive spirometer in an upright position. INSTRUCTIONS FOR USE  1. Sit on the edge of your bed if possible, or sit up as far as you can in bed or on a chair. 2. Hold the incentive spirometer in an upright position. 3. Breathe out normally. 4. Place the mouthpiece in your mouth and seal your lips tightly around it. 5. Breathe in slowly and as deeply as possible, raising the piston or the ball toward the top of the column. 6. Hold your breath for 3-5 seconds or for as long as possible. Allow the piston or ball to fall to the bottom of the column. 7. Remove the mouthpiece from your mouth and breathe out normally. 8. Rest for a few seconds and repeat Steps 1 through 7 at least 10 times every 1-2 hours when you are awake. Take your time and take a few normal breaths between deep breaths. 9. The spirometer may include an indicator to show your best effort. Use the indicator as a goal to work toward during each repetition. 10. After each set of 10 deep breaths, practice coughing to be sure your lungs are clear. If you have an incision (the cut made at the time of surgery), support your incision when coughing by placing a pillow or  rolled up towels firmly against it. Once you are able to get out of bed, walk around indoors and cough well. You may stop using the incentive spirometer when instructed by your caregiver.  RISKS AND COMPLICATIONS  Take your time so you do not get dizzy or light-headed.  If you are in pain, you may need to take or ask for pain medication before doing incentive spirometry. It is harder  to take a deep breath if you are having pain. AFTER USE  Rest and breathe slowly and easily.  It can be helpful to keep track of a log of your progress. Your caregiver can provide you with a simple table to help with this. If you are using the spirometer at home, follow these instructions: [place] IF:   You are having difficultly using the spirometer.  You have trouble using the spirometer as often as instructed.  Your pain medication is not giving enough relief while using the spirometer.  You develop fever of 100.5 F (38.1 C) or higher. SEEK IMMEDIATE MEDICAL CARE IF:   You cough up bloody sputum that had not been present before.  You develop fever of 102 F (38.9 C) or greater.  You develop worsening pain at or near the incision site. MAKE SURE YOU:   Understand these instructions.  Will watch your condition.  Will get help right away if you are not doing well or get worse. Document Released: 01/13/2007 Document Revised: 11/25/2011 Document Reviewed: 03/16/2007 [facility] Patient Information 2014 [place], [place].   ________________________________________________________________________

## 2015-10-19 ENCOUNTER — Inpatient Hospital Stay (HOSPITAL_COMMUNITY): Payer: Medicare Other | Admitting: Anesthesiology

## 2015-10-19 ENCOUNTER — Encounter (HOSPITAL_COMMUNITY): Payer: Self-pay | Admitting: *Deleted

## 2015-10-19 ENCOUNTER — Inpatient Hospital Stay (HOSPITAL_COMMUNITY): Payer: Medicare Other

## 2015-10-19 ENCOUNTER — Encounter (HOSPITAL_COMMUNITY)
Admission: RE | Disposition: A | Discharge status: Home / Self Care | Payer: Self-pay | Source: Ambulatory Visit | Attending: Urology

## 2015-10-19 ENCOUNTER — Inpatient Hospital Stay (HOSPITAL_COMMUNITY)
Admission: RE | Admit: 2015-10-19 | Discharge: 2015-10-21 | DRG: 713 | Disposition: A | Discharge status: Home / Self Care | Payer: Medicare Other | Source: Ambulatory Visit | Attending: Urology | Admitting: Urology

## 2015-10-19 DIAGNOSIS — Z01818 Encounter for other preprocedural examination: Secondary | ICD-10-CM

## 2015-10-19 DIAGNOSIS — Z7952 Long term (current) use of systemic steroids: Secondary | ICD-10-CM

## 2015-10-19 DIAGNOSIS — R578 Other shock: Secondary | ICD-10-CM | POA: Diagnosis not present

## 2015-10-19 DIAGNOSIS — N401 Enlarged prostate with lower urinary tract symptoms: Principal | ICD-10-CM | POA: Diagnosis present

## 2015-10-19 DIAGNOSIS — Z823 Family history of stroke: Secondary | ICD-10-CM

## 2015-10-19 DIAGNOSIS — N323 Diverticulum of bladder: Secondary | ICD-10-CM | POA: Diagnosis present

## 2015-10-19 DIAGNOSIS — E1165 Type 2 diabetes mellitus with hyperglycemia: Secondary | ICD-10-CM | POA: Diagnosis present

## 2015-10-19 DIAGNOSIS — Z8249 Family history of ischemic heart disease and other diseases of the circulatory system: Secondary | ICD-10-CM | POA: Diagnosis not present

## 2015-10-19 DIAGNOSIS — Z87891 Personal history of nicotine dependence: Secondary | ICD-10-CM

## 2015-10-19 DIAGNOSIS — N400 Enlarged prostate without lower urinary tract symptoms: Secondary | ICD-10-CM | POA: Diagnosis present

## 2015-10-19 DIAGNOSIS — Z01812 Encounter for preprocedural laboratory examination: Secondary | ICD-10-CM | POA: Diagnosis not present

## 2015-10-19 DIAGNOSIS — R319 Hematuria, unspecified: Secondary | ICD-10-CM | POA: Diagnosis present

## 2015-10-19 DIAGNOSIS — I9581 Postprocedural hypotension: Secondary | ICD-10-CM | POA: Diagnosis not present

## 2015-10-19 DIAGNOSIS — E2740 Unspecified adrenocortical insufficiency: Secondary | ICD-10-CM | POA: Diagnosis present

## 2015-10-19 DIAGNOSIS — R579 Shock, unspecified: Secondary | ICD-10-CM

## 2015-10-19 DIAGNOSIS — Z9079 Acquired absence of other genital organ(s): Secondary | ICD-10-CM | POA: Diagnosis not present

## 2015-10-19 DIAGNOSIS — E861 Hypovolemia: Secondary | ICD-10-CM | POA: Diagnosis present

## 2015-10-19 DIAGNOSIS — Z79899 Other long term (current) drug therapy: Secondary | ICD-10-CM | POA: Diagnosis not present

## 2015-10-19 DIAGNOSIS — Z01810 Encounter for preprocedural cardiovascular examination: Secondary | ICD-10-CM | POA: Diagnosis not present

## 2015-10-19 DIAGNOSIS — N138 Other obstructive and reflux uropathy: Secondary | ICD-10-CM | POA: Diagnosis present

## 2015-10-19 DIAGNOSIS — D62 Acute posthemorrhagic anemia: Secondary | ICD-10-CM | POA: Diagnosis not present

## 2015-10-19 DIAGNOSIS — N5201 Erectile dysfunction due to arterial insufficiency: Secondary | ICD-10-CM | POA: Diagnosis present

## 2015-10-19 DIAGNOSIS — N3289 Other specified disorders of bladder: Secondary | ICD-10-CM | POA: Diagnosis present

## 2015-10-19 DIAGNOSIS — M069 Rheumatoid arthritis, unspecified: Secondary | ICD-10-CM | POA: Diagnosis present

## 2015-10-19 HISTORY — PX: TRANSURETHRAL RESECTION OF PROSTATE: SHX73

## 2015-10-19 HISTORY — PX: CYSTOSCOPY: SHX5120

## 2015-10-19 LAB — CBC
HCT: 31.1 % — ABNORMAL LOW (ref 39.0–52.0)
HEMATOCRIT: 33.1 % — AB (ref 39.0–52.0)
HEMATOCRIT: 32.5 % — AB (ref 39.0–52.0)
HEMOGLOBIN: 10.5 g/dL — AB (ref 13.0–17.0)
Hemoglobin: 11.2 g/dL — ABNORMAL LOW (ref 13.0–17.0)
Hemoglobin: 10.8 g/dL — ABNORMAL LOW (ref 13.0–17.0)
MCH: 31.3 pg (ref 26.0–34.0)
MCH: 30.9 pg (ref 26.0–34.0)
MCH: 31 pg (ref 26.0–34.0)
MCHC: 33.8 g/dL (ref 30.0–36.0)
MCHC: 33.8 g/dL (ref 30.0–36.0)
MCHC: 33.2 g/dL (ref 30.0–36.0)
MCV: 92.6 fL (ref 78.0–100.0)
MCV: 91.2 fL (ref 78.0–100.0)
MCV: 93.4 fL (ref 78.0–100.0)
Platelets: 146 10*3/uL — ABNORMAL LOW (ref 150–400)
Platelets: 154 10*3/uL (ref 150–400)
Platelets: 158 10*3/uL (ref 150–400)
RBC: 3.36 MIL/uL — AB (ref 4.22–5.81)
RBC: 3.63 MIL/uL — AB (ref 4.22–5.81)
RBC: 3.48 MIL/uL — AB (ref 4.22–5.81)
RDW: 15.4 % (ref 11.5–15.5)
RDW: 15.3 % (ref 11.5–15.5)
RDW: 15.6 % — ABNORMAL HIGH (ref 11.5–15.5)
WBC: 4.6 10*3/uL (ref 4.0–10.5)
WBC: 7.6 10*3/uL (ref 4.0–10.5)
WBC: 10.9 10*3/uL — AB (ref 4.0–10.5)

## 2015-10-19 LAB — GLUCOSE, CAPILLARY
GLUCOSE-CAPILLARY: 138 mg/dL — AB (ref 65–99)
Glucose-Capillary: 123 mg/dL — ABNORMAL HIGH (ref 65–99)
Glucose-Capillary: 127 mg/dL — ABNORMAL HIGH (ref 65–99)
Glucose-Capillary: 152 mg/dL — ABNORMAL HIGH (ref 65–99)

## 2015-10-19 LAB — BASIC METABOLIC PANEL
ANION GAP: 10 (ref 5–15)
BUN: 6 mg/dL (ref 6–20)
CHLORIDE: 105 mmol/L (ref 101–111)
CO2: 24 mmol/L (ref 22–32)
Calcium: 7.9 mg/dL — ABNORMAL LOW (ref 8.9–10.3)
Creatinine, Ser: 0.75 mg/dL (ref 0.61–1.24)
GFR calc Af Amer: >60 mL/min (ref >60)
GFR calc non Af Amer: >60 mL/min (ref >60)
GLUCOSE: 150 mg/dL — AB (ref 65–99)
POTASSIUM: 4 mmol/L (ref 3.5–5.1)
Sodium: 139 mmol/L (ref 135–145)

## 2015-10-19 LAB — ABO/RH: ABO/RH(D): A POS

## 2015-10-19 LAB — TYPE AND SCREEN
ABO/RH(D): A POS
Antibody Screen: NEGATIVE

## 2015-10-19 LAB — LACTIC ACID, PLASMA: LACTIC ACID, VENOUS: 1.4 mmol/L (ref 0.5–2.0)

## 2015-10-19 LAB — TROPONIN I
Troponin I: <0.03 ng/mL (ref <0.031)
Troponin I: <0.03 ng/mL (ref <0.031)

## 2015-10-19 LAB — MRSA PCR SCREENING: MRSA by PCR: NEGATIVE

## 2015-10-19 SURGERY — TURP (TRANSURETHRAL RESECTION OF PROSTATE)
Anesthesia: General

## 2015-10-19 MED ORDER — LACTATED RINGERS IV SOLN
INTRAVENOUS | Status: DC | PRN
  Administered 2015-10-19: 12:00:00 via INTRAVENOUS

## 2015-10-19 MED ORDER — SODIUM CHLORIDE 0.45 % IV SOLN
INTRAVENOUS | Status: DC
  Administered 2015-10-19: 14:00:00 via INTRAVENOUS

## 2015-10-19 MED ORDER — AMLODIPINE BESYLATE 5 MG PO TABS: 2.5000 mg | ORAL_TABLET | Freq: Every day | ORAL | Status: DC

## 2015-10-19 MED ORDER — ACETAMINOPHEN 10 MG/ML IV SOLN
INTRAVENOUS | Status: AC
  Filled 2015-10-19: qty 100

## 2015-10-19 MED ORDER — ACETAMINOPHEN 325 MG PO TABS
650.0000 mg | ORAL_TABLET | ORAL | Status: DC | PRN
  Administered 2015-10-21: 650 mg via ORAL
  Filled 2015-10-19: qty 2

## 2015-10-19 MED ORDER — BELLADONNA ALKALOIDS-OPIUM 16.2-60 MG RE SUPP: 1.0000 | Freq: Four times a day (QID) | RECTAL | Status: DC | PRN

## 2015-10-19 MED ORDER — ONDANSETRON HCL 4 MG/2ML IJ SOLN
INTRAMUSCULAR | Status: AC
  Filled 2015-10-19: qty 2

## 2015-10-19 MED ORDER — FENTANYL CITRATE (PF) 100 MCG/2ML IJ SOLN
INTRAMUSCULAR | Status: DC | PRN
  Administered 2015-10-19 (×2): 50 ug via INTRAVENOUS

## 2015-10-19 MED ORDER — HYDROCODONE-ACETAMINOPHEN 5-325 MG PO TABS
1.0000 | ORAL_TABLET | ORAL | Status: DC | PRN
  Administered 2015-10-19: 1 via ORAL
  Filled 2015-10-19: qty 1

## 2015-10-19 MED ORDER — PHENYLEPHRINE 40 MCG/ML (10ML) SYRINGE FOR IV PUSH (FOR BLOOD PRESSURE SUPPORT)
PREFILLED_SYRINGE | INTRAVENOUS | Status: AC
  Filled 2015-10-19: qty 10

## 2015-10-19 MED ORDER — LACTATED RINGERS IV SOLN
INTRAVENOUS | Status: DC | PRN
  Administered 2015-10-19 (×3): via INTRAVENOUS

## 2015-10-19 MED ORDER — HYDROMORPHONE HCL 1 MG/ML IJ SOLN
0.5000 mg | INTRAMUSCULAR | Status: DC | PRN
Start: 2015-10-19 — End: 2015-10-19
  Administered 2015-10-19: 1 mg via INTRAVENOUS
  Filled 2015-10-19: qty 1

## 2015-10-19 MED ORDER — OXYCODONE-ACETAMINOPHEN 5-325 MG PO TABS: 1.0000 | ORAL_TABLET | ORAL | Status: DC | PRN

## 2015-10-19 MED ORDER — CALCIUM CARBONATE-VITAMIN D 500-200 MG-UNIT PO TABS
2.0000 | ORAL_TABLET | Freq: Every day | ORAL | Status: DC
  Administered 2015-10-20 – 2015-10-21 (×2): 2 via ORAL
  Filled 2015-10-19 (×2): qty 2

## 2015-10-19 MED ORDER — INSULIN ASPART 100 UNIT/ML ~~LOC~~ SOLN
0.0000 [IU] | SUBCUTANEOUS | Status: DC
Start: 2015-10-19 — End: 2015-10-20
  Administered 2015-10-19 (×2): 1 [IU] via SUBCUTANEOUS

## 2015-10-19 MED ORDER — BACITRACIN-NEOMYCIN-POLYMYXIN 400-5-5000 EX OINT: 1.0000 "application " | TOPICAL_OINTMENT | Freq: Three times a day (TID) | CUTANEOUS | Status: DC | PRN

## 2015-10-19 MED ORDER — CALCIUM CITRATE-VITAMIN D 315-200 MG-UNIT PO TABS: 2.0000 | ORAL_TABLET | Freq: Every day | ORAL | Status: DC

## 2015-10-19 MED ORDER — ZOLPIDEM TARTRATE 5 MG PO TABS: 5.0000 mg | ORAL_TABLET | Freq: Every evening | ORAL | Status: DC | PRN

## 2015-10-19 MED ORDER — PROPOFOL 10 MG/ML IV BOLUS
INTRAVENOUS | Status: AC
  Filled 2015-10-19: qty 20

## 2015-10-19 MED ORDER — PANTOPRAZOLE SODIUM 40 MG PO TBEC
40.0000 mg | DELAYED_RELEASE_TABLET | Freq: Every day | ORAL | Status: DC
  Administered 2015-10-20: 40 mg via ORAL
  Filled 2015-10-19 (×2): qty 1

## 2015-10-19 MED ORDER — PHENYLEPHRINE HCL 10 MG/ML IJ SOLN
INTRAMUSCULAR | Status: AC
  Filled 2015-10-19: qty 1

## 2015-10-19 MED ORDER — CEFAZOLIN SODIUM-DEXTROSE 2-3 GM-% IV SOLR
2.0000 g | INTRAVENOUS | Status: AC
  Administered 2015-10-19: 2 g via INTRAVENOUS

## 2015-10-19 MED ORDER — HYDROMORPHONE HCL 1 MG/ML IJ SOLN
0.2500 mg | INTRAMUSCULAR | Status: DC | PRN
  Administered 2015-10-19 (×2): 0.25 mg via INTRAVENOUS

## 2015-10-19 MED ORDER — MIDAZOLAM HCL 5 MG/5ML IJ SOLN
INTRAMUSCULAR | Status: DC | PRN
  Administered 2015-10-19 (×2): 1 mg via INTRAVENOUS

## 2015-10-19 MED ORDER — CIPROFLOXACIN IN D5W 400 MG/200ML IV SOLN
400.0000 mg | Freq: Two times a day (BID) | INTRAVENOUS | Status: DC
  Administered 2015-10-20 – 2015-10-21 (×3): 400 mg via INTRAVENOUS
  Filled 2015-10-19 (×3): qty 200

## 2015-10-19 MED ORDER — LIDOCAINE HCL (CARDIAC) 20 MG/ML IV SOLN
INTRAVENOUS | Status: AC
  Filled 2015-10-19: qty 5

## 2015-10-19 MED ORDER — ONDANSETRON HCL 4 MG/2ML IJ SOLN: 4.0000 mg | INTRAMUSCULAR | Status: DC | PRN

## 2015-10-19 MED ORDER — CEFAZOLIN SODIUM-DEXTROSE 2-3 GM-% IV SOLR
INTRAVENOUS | Status: AC
  Filled 2015-10-19: qty 50

## 2015-10-19 MED ORDER — FLUDROCORTISONE 0.1 MG/ML ORAL SUSPENSION: 0.1000 mg | Freq: Every day | ORAL | Status: DC

## 2015-10-19 MED ORDER — HYDROMORPHONE HCL 1 MG/ML IJ SOLN
INTRAMUSCULAR | Status: AC
  Filled 2015-10-19: qty 1

## 2015-10-19 MED ORDER — PROPOFOL 10 MG/ML IV BOLUS
INTRAVENOUS | Status: DC | PRN
  Administered 2015-10-19: 20 mg via INTRAVENOUS
  Administered 2015-10-19: 150 mg via INTRAVENOUS

## 2015-10-19 MED ORDER — 0.9 % SODIUM CHLORIDE (POUR BTL) OPTIME
TOPICAL | Status: DC | PRN
Start: 2015-10-19 — End: 2015-10-19
  Administered 2015-10-19: 3000 mL

## 2015-10-19 MED ORDER — SODIUM CHLORIDE 0.9 % IR SOLN
3000.0000 mL | Status: DC
  Administered 2015-10-19 – 2015-10-20 (×5): 3000 mL

## 2015-10-19 MED ORDER — SODIUM CHLORIDE 0.9 % IJ SOLN
INTRAMUSCULAR | Status: AC
  Filled 2015-10-19: qty 10

## 2015-10-19 MED ORDER — ACETAMINOPHEN 10 MG/ML IV SOLN
1000.0000 mg | Freq: Once | INTRAVENOUS | Status: AC
  Administered 2015-10-19: 1000 mg via INTRAVENOUS

## 2015-10-19 MED ORDER — LEFLUNOMIDE 10 MG PO TABS
10.0000 mg | ORAL_TABLET | Freq: Every day | ORAL | Status: DC
  Administered 2015-10-20: 10 mg via ORAL
  Filled 2015-10-19 (×2): qty 1

## 2015-10-19 MED ORDER — OXYCODONE HCL 5 MG PO TABS: 5.0000 mg | ORAL_TABLET | Freq: Once | ORAL | Status: DC | PRN

## 2015-10-19 MED ORDER — PHENYLEPHRINE HCL 10 MG/ML IJ SOLN
0.0000 ug/min | INTRAVENOUS | Status: DC
  Administered 2015-10-19: 13.333 ug/min via INTRAVENOUS
  Filled 2015-10-19: qty 1

## 2015-10-19 MED ORDER — FLUDROCORTISONE ACETATE 0.1 MG PO TABS
0.1000 mg | ORAL_TABLET | Freq: Every day | ORAL | Status: DC
  Administered 2015-10-19: 0.1 mg via ORAL
  Filled 2015-10-19 (×2): qty 1

## 2015-10-19 MED ORDER — LIDOCAINE HCL (CARDIAC) 20 MG/ML IV SOLN
INTRAVENOUS | Status: DC | PRN
  Administered 2015-10-19: 50 mg via INTRAVENOUS

## 2015-10-19 MED ORDER — VITAMIN E 180 MG (400 UNIT) PO CAPS
400.0000 [IU] | ORAL_CAPSULE | Freq: Every day | ORAL | Status: DC
  Administered 2015-10-20: 400 [IU] via ORAL
  Filled 2015-10-19 (×2): qty 1

## 2015-10-19 MED ORDER — PHENYLEPHRINE HCL 10 MG/ML IJ SOLN
30.0000 ug/min | INTRAMUSCULAR | Status: DC
  Administered 2015-10-19: 40 ug/min via INTRAVENOUS
  Administered 2015-10-19: 50 ug/min via INTRAVENOUS
  Filled 2015-10-19 (×2): qty 1

## 2015-10-19 MED ORDER — MEPERIDINE HCL 50 MG/ML IJ SOLN: 6.2500 mg | INTRAMUSCULAR | Status: DC | PRN

## 2015-10-19 MED ORDER — BELLADONNA ALKALOIDS-OPIUM 16.2-60 MG RE SUPP
RECTAL | Status: AC
  Filled 2015-10-19: qty 1

## 2015-10-19 MED ORDER — VITAMIN C 500 MG PO TABS
500.0000 mg | ORAL_TABLET | Freq: Every day | ORAL | Status: DC
  Administered 2015-10-19 – 2015-10-20 (×2): 500 mg via ORAL
  Filled 2015-10-19 (×3): qty 1

## 2015-10-19 MED ORDER — LIDOCAINE HCL 2 % EX GEL
CUTANEOUS | Status: AC
  Filled 2015-10-19: qty 5

## 2015-10-19 MED ORDER — SENNOSIDES-DOCUSATE SODIUM 8.6-50 MG PO TABS
2.0000 | ORAL_TABLET | Freq: Every day | ORAL | Status: DC
  Administered 2015-10-20: 2 via ORAL
  Filled 2015-10-19 (×2): qty 2

## 2015-10-19 MED ORDER — HYDROCORTISONE NA SUCCINATE PF 100 MG IJ SOLR
50.0000 mg | Freq: Four times a day (QID) | INTRAMUSCULAR | Status: DC
  Administered 2015-10-19 – 2015-10-20 (×3): 50 mg via INTRAVENOUS
  Filled 2015-10-19 (×2): qty 1
  Filled 2015-10-19: qty 2
  Filled 2015-10-19 (×3): qty 1
  Filled 2015-10-19: qty 2
  Filled 2015-10-19 (×3): qty 1

## 2015-10-19 MED ORDER — SODIUM CHLORIDE 0.9 % IR SOLN
Status: DC | PRN
  Administered 2015-10-19: 35000 mL

## 2015-10-19 MED ORDER — BENZONATATE 100 MG PO CAPS: 100.0000 mg | ORAL_CAPSULE | Freq: Three times a day (TID) | ORAL | Status: DC | PRN

## 2015-10-19 MED ORDER — PHENYLEPHRINE HCL 10 MG/ML IJ SOLN
INTRAMUSCULAR | Status: DC | PRN
Start: 2015-10-19 — End: 2015-10-19
  Administered 2015-10-19 (×2): 40 ug via INTRAVENOUS
  Administered 2015-10-19: 50 ug via INTRAVENOUS
  Administered 2015-10-19: 40 ug via INTRAVENOUS
  Administered 2015-10-19 (×2): 80 ug via INTRAVENOUS
  Administered 2015-10-19: 120 ug via INTRAVENOUS

## 2015-10-19 MED ORDER — EPHEDRINE SULFATE 50 MG/ML IJ SOLN
INTRAMUSCULAR | Status: AC
  Filled 2015-10-19: qty 1

## 2015-10-19 MED ORDER — ACETAMINOPHEN 500 MG PO TABS: 1000.0000 mg | ORAL_TABLET | Freq: Four times a day (QID) | ORAL | Status: DC | PRN

## 2015-10-19 MED ORDER — PREDNISONE 5 MG PO TABS
5.0000 mg | ORAL_TABLET | Freq: Every day | ORAL | Status: DC
Start: 2015-10-20 — End: 2015-10-21
  Administered 2015-10-20 – 2015-10-21 (×2): 5 mg via ORAL
  Filled 2015-10-19 (×2): qty 1

## 2015-10-19 MED ORDER — OXYCODONE HCL 5 MG/5ML PO SOLN: 5.0000 mg | Freq: Once | ORAL | Status: DC | PRN

## 2015-10-19 MED ORDER — SODIUM CHLORIDE 0.9 % IV SOLN
10.0000 mg | INTRAVENOUS | Status: DC | PRN
  Administered 2015-10-19: 30 ug/min via INTRAVENOUS

## 2015-10-19 MED ORDER — SODIUM CHLORIDE 0.9 % IV SOLN
INTRAVENOUS | Status: DC
  Administered 2015-10-19 – 2015-10-20 (×2): via INTRAVENOUS

## 2015-10-19 MED ORDER — FENTANYL CITRATE (PF) 250 MCG/5ML IJ SOLN
INTRAMUSCULAR | Status: AC
  Filled 2015-10-19: qty 5

## 2015-10-19 MED ORDER — FENTANYL CITRATE (PF) 100 MCG/2ML IJ SOLN
25.0000 ug | INTRAMUSCULAR | Status: DC | PRN
  Administered 2015-10-19 – 2015-10-20 (×2): 25 ug via INTRAVENOUS
  Filled 2015-10-19 (×2): qty 2

## 2015-10-19 MED ORDER — MIDAZOLAM HCL 2 MG/2ML IJ SOLN
INTRAMUSCULAR | Status: AC
  Filled 2015-10-19: qty 2

## 2015-10-19 MED ORDER — CIPROFLOXACIN HCL 500 MG PO TABS
500.0000 mg | ORAL_TABLET | Freq: Two times a day (BID) | ORAL | Status: DC
  Administered 2015-10-19: 500 mg via ORAL
  Filled 2015-10-19: qty 1

## 2015-10-19 MED ORDER — BELLADONNA ALKALOIDS-OPIUM 16.2-60 MG RE SUPP
RECTAL | Status: DC | PRN
  Administered 2015-10-19: 1 via RECTAL

## 2015-10-19 SURGICAL SUPPLY — 20 items
BAG URINE DRAINAGE (UROLOGICAL SUPPLIES) IMPLANT
BAG URO CATCHER STRL LF (MISCELLANEOUS) ×3 IMPLANT
CATH FOLEY 3WAY 30CC 24FR (CATHETERS) ×3
CATH HEMA 3WAY 30CC 22FR COUDE (CATHETERS) ×3 IMPLANT
CATH URO 16X24FR 3W FL PS (CATHETERS) IMPLANT
CLOTH BEACON ORANGE TIMEOUT ST (SAFETY) ×3 IMPLANT
GAUZE SPONGE 4X4 12PLY STRL (GAUZE/BANDAGES/DRESSINGS) ×2 IMPLANT
GLOVE BIOGEL M STRL SZ7.5 (GLOVE) ×3 IMPLANT
GOWN STRL REUS W/TWL LRG LVL3 (GOWN DISPOSABLE) ×3 IMPLANT
GOWN STRL REUS W/TWL XL LVL3 (GOWN DISPOSABLE) ×3 IMPLANT
GUIDEWIRE STR DUAL SENSOR (WIRE) ×2 IMPLANT
HOLDER FOLEY CATH W/STRAP (MISCELLANEOUS) IMPLANT
KIT ASPIRATION TUBING (SET/KITS/TRAYS/PACK) ×3 IMPLANT
LOOP CUT BIPOLAR 24F LRG (ELECTROSURGICAL) ×2 IMPLANT
MANIFOLD NEPTUNE II (INSTRUMENTS) ×3 IMPLANT
PACK CYSTO (CUSTOM PROCEDURE TRAY) ×3 IMPLANT
SYR 30ML LL (SYRINGE) ×4 IMPLANT
SYRINGE IRR TOOMEY STRL 70CC (SYRINGE) ×3 IMPLANT
TUBING CONNECTING 10 (TUBING) ×4 IMPLANT
TUBING CONNECTING 10' (TUBING) ×2

## 2015-10-19 NOTE — Progress Notes (Signed)
PHARMACY NOTE - Renal dose adjust antibiotics  Pharmacy has been assisting with dosing of Cipro for UTI/prophylaxis s/p TURP. Dosage remains stable at Cipro '500mg'$  po q12h and need for further dosage adjustment appears unlikely at present.    Will sign off at this time.  Please reconsult if a change in clinical status warrants re-evaluation of dosage.  Netta Cedars, PharmD, BCPS Pager: [phone] 10/19/2015'@3'$ :34 PM

## 2015-10-19 NOTE — Anesthesia Postprocedure Evaluation (Signed)
Anesthesia Post Note  Patient: [name] [name]  Procedure(s) Performed: Procedure(s) (LRB): TRANSURETHRAL RESECTION OF THE PROSTATE (TURP) (N/A) CYSTOSCOPY (N/A)  Patient location during evaluation: PACU Anesthesia Type: General Level of consciousness: awake and alert Pain management: pain level controlled Vital Signs Assessment: post-procedure vital signs reviewed and stable Respiratory status: spontaneous breathing, nonlabored ventilation and respiratory function stable Cardiovascular status: blood pressure returned to baseline and stable Postop Assessment: no signs of nausea or vomiting Anesthetic complications: no    Last Vitals:  Filed Vitals:   10/19/15 1420 10/19/15 1425  BP: 104/74 96/76  Pulse: 78 77  Temp:    Resp: 16 18    Last Pain:  Filed Vitals:   10/19/15 1429  PainSc: 0-No pain                 Prentiss Hammett A

## 2015-10-19 NOTE — Interval H&P Note (Signed)
History and Physical Interval Note:  10/19/2015 10:09 AM  [name] [name]  has presented today for surgery, with the diagnosis of BENIGN LOCALIZED HYPERPLASIA  The various methods of treatment have been discussed with the patient and family. After consideration of risks, benefits and other options for treatment, the patient has consented to  Procedure(s): TRANSURETHRAL RESECTION OF THE PROSTATE (TURP) (N/A) CYSTOSCOPY (N/A) as a surgical intervention .  The patient's history has been reviewed, patient examined, no change in status, stable for surgery.  I have reviewed the patient's chart and labs.  Questions were answered to the patient's satisfaction.     [name] I Joyclyn Plazola

## 2015-10-19 NOTE — H&P (Signed)
Reason For Visit Cystoscopy, flowrate, PVR & PUS   Active Problems Problems  1. 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 Nocturia (R35.1)   Assessed By: Carolan Clines (Urology); Last Assessed: 17 Jul 2015 4. Urinary frequency (R35.0)  History of Present Illness     70 yo male returns today for a cystoscopy, flowrate, PVR & prostate u/s for possible Urolift candidate. Hx of BPH with frequency and urgency. He has hx of nodular prostate on exam. Last psa 2013-1.76. IPSS now 15. Cannot remember to take tamsulosin every day.      He has RA (Dr. Jefm Bryant); and is post RML lobectomy, chemotherapy for Ca Lung; and R colectomy for Ca colon. He has a remote hx of bleeding ulcer (post exploration per Dr., Burgess Amor).     He has BPH, and is voiding well. He takes tamsulosin '4mg'$ /day. Previous PSA=2.13 in 2010, and Vit D=41.5.   Hx of Right inguinal hernia, s/p Rt inguinal hernia repair by Dr. Hassell Done on 06/03/14 .     08/03/12 PSA - 1.76   Past Medical History Problems  1. History of Arthritis 2. History of Colon Cancer 3. History of Gastric ulcer (K25.9) 4. History of Gastrointestinal Bleeding 5. History of Lung Cancer  Surgical History Problems  1. History of Back Surgery 2. History of Colon Surgery 3. History of Gastric Surgery 4. History of Lung Surgery  Current Meds 1. Calcium + D TABS;  Therapy: (Recorded:16Apr2008) to Recorded 2. Leflunomide 10 MG Oral Tablet;  Therapy: (Recorded:05Sep2012) to Recorded 3. PredniSONE 5 MG Oral Tablet;  Therapy: (Recorded:14Jul2015) to Recorded 4. PriLOSEC OTC TBEC;  Therapy: (Recorded:16Apr2008) to Recorded 5. Tamsulosin HCl - 0.4 MG Oral Capsule; TAKE 1 CAPSULE BY MOUTH  DAILY;  Therapy: [id number] to (Evaluate:24Aug2017)  Requested for: 29Aug2016; Last  Rx:29Aug2016 Ordered 6. Viagra 100 MG Oral Tablet; TAKE 1 TABLET As Directed;  Therapy: [id number] to (Last Rx:25Nov2013) Ordered 7. Vitamin E TABS;  Therapy: (Recorded:16Apr2008) to Recorded  Allergies Medication  1. Aspirin Low Dose TABS  Family History Problems  1. Family history of Acute Myocardial Infarction : Father 2. Family history of Acute Myocardial Infarction : Mother 3. Family history of Breast Cancer : Sister 4. Family history of Esophageal Cancer   one brother and one sister had cancer 74. Family history of Family Health Status Number Of Children : Sister   one son and one daughter 35. Family history of Stroke Syndrome : Father 85. Family history of Stroke Syndrome : Mother  Social History Problems  1. Alcohol Use (History)   1-2 a day 2. Caffeine Use   3-4 a day 3. Family history of Death In The Family Father   40yr, MI 4. Family history of Death In The Family Mother   756yr Stroke 5. Former smoker ([id number]. Marital History - Currently Married 7. Occupation:   retried  Review of Systems Genitourinary, constitutional, skin, eye, otolaryngeal, hematologic/lymphatic, cardiovascular, pulmonary, endocrine, musculoskeletal, gastrointestinal, neurological and psychiatric system(s) were reviewed and pertinent findings if present are noted and are otherwise negative.  Genitourinary: urinary frequency, feelings of urinary urgency, nocturia, weak urinary stream, urinary stream starts and stops, incomplete emptying of bladder and initiating urination requires straining.    Vitals Vital Signs [Data Includes: Last 1 Day]  Recorded: 31Oct2016 02:56PM  Blood  Pressure: 148 / 94 Temperature: 98.1 F Heart Rate: 109  Physical Exam Constitutional: Well nourished and well developed . No acute distress.  ENT:. The ears and nose are normal in appearance.  Neck: The appearance of the  neck is normal and no neck mass is present.  Pulmonary:. Breathing requires use of accessory muscles. Post Right lobectomy.  Cardiovascular:. No peripheral edema.  Abdomen: lower midline, upper midline, Gibson incision site(s) well healed. The abdomen is rounded, but not distended. The abdomen is soft and nontender. No masses are palpated. No CVA tenderness. Bowel sounds are normal. No incisional hernia.  No umbilical hernia.  A right inguinal hernia is present, which is reducible.  No inguinal hernia is present on the left. No hepatosplenomegaly noted.  Rectal: Rectal exam demonstrates normal sphincter tone, no tenderness and no masses. Estimated prostate size is 4+. Prostate lobular. The prostate has no nodularity, is indurated involving the right, base of the prostate which appears to be confined within the prostate capsule and is not tender. The left seminal vesicle is nonpalpable. The right seminal vesicle is nonpalpable. The perineum is normal on inspection.  Genitourinary: Examination of the penis demonstrates no discharge, no masses, no lesions and a normal meatus. The penis is circumcised. The scrotum is normal in appearance and without lesions. The right vas deferens is not able to be palpated. The left vas deferens is not able to be palpated. The right epididymis is palpably normal and non-tender. The left epididymis is palpably normal and non-tender. The right testis is palpably normal, non-tender and without masses. The left testis is normal, non-tender and without masses. Normal lymphatics.  Lymphatics: The femoral and inguinal nodes are not enlarged or tender.  Skin: Normal skin turgor, no visible rash and no visible skin lesions.  Neuro/Psych:. Mood and affect are appropriate.    Results/Data Urine [Data Includes: Last 1 Day]   31Oct2016  COLOR YELLOW   APPEARANCE CLEAR   SPECIFIC GRAVITY 1.010   pH 5.5   GLUCOSE NEGATIVE   BILIRUBIN NEGATIVE   KETONE NEGATIVE   BLOOD NEGATIVE    PROTEIN NEGATIVE   NITRITE NEGATIVE   LEUKOCYTE ESTERASE NEGATIVE    Flow Rate: Voided 204 ml. A peak flow rate of 110m/s and mean flow rate of 189ms.  PVR: Ultrasound PVR 146 ml.    Procedure Prostate u/s today: Length - 4.98cm, Height - 4.00cm and Width - 5.32cm. Total volume - 55.51 grams. Multiple calcifications and subcentimeter cystic areas within prostate.    PVR - 415.70cc with questionable bladder tic - 19.58cc.   Procedure: Cystoscopy  Chaperone Present: laura.  Indication: Lower Urinary Tract Symptoms.  Informed Consent: Risks, benefits, and potential adverse events were discussed and informed consent was obtained from the patient.  Prep: The patient was prepped with betadine.  Anesthesia:. Local anesthesia was administered intraurethrally with 2% lidocaine jelly.  Antibiotic prophylaxis: Ciprofloxacin.  Procedure Note:  Urethral meatus:. No abnormalities.  Anterior urethra: No abnormalities.  Prostatic urethra:. There was visual obstruction of the prostatic urethra. The lateral prostatic lobes were enlarged. An enlarged intravesical median lobe was visualized.  Bladder: Visulization was obscured due to cloudy urine. The ureteral orifices were in the normal anatomic position bilaterally. Examination of the bladder demonstrated trabeculation, but no clot within the bladder cellules, but no erythematous mucosa and no edema. The patient tolerated the procedure well.    Assessment Assessed  1. 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 The patient has a peak flow rate of 16 cc/s. As a postvoid residual of 146 cc. Cystoscopy shows large lateral lobes, right greater than left. In addition, he does have some median lobe. He may be better off with a TURP, in order to reduce his outlet resistance/obstruction.  Plan Benign localized hyperplasia of prostate with urinary obstruction   1. Follow-up Schedule Surgery Office  Follow-up  Status: Hold For - Appointment   Requested for: 31Oct2016 Health Maintenance  2. UA With REFLEX; [Do Not Release]; Status:Resulted - Requires Verification;   Done:  [id number] 02:08PM  Adviser TURP   Discussion/Summary cc: Dr. Cristi Loron, [facility]

## 2015-10-19 NOTE — Progress Notes (Signed)
Clelia Croft PA for CCM in to see. Aware of VS, rate of Neo drip, and HGB 10.5. Orders given.

## 2015-10-19 NOTE — Anesthesia Preprocedure Evaluation (Addendum)
Anesthesia Evaluation  Patient identified by MRN, date of birth, ID band Patient awake    Reviewed: Allergy & Precautions, NPO status , Patient's Chart, lab work & pertinent test results  Airway Mallampati: I  TM Distance: >3 FB Neck ROM: Full    Dental  (+) Teeth Intact, Dental Advisory Given   Pulmonary former smoker,    breath sounds clear to auscultation       Cardiovascular hypertension, Pt. on medications  Rhythm:Regular Rate:Normal     Neuro/Psych    GI/Hepatic GERD  Medicated and Controlled,  Endo/Other    Renal/GU      Musculoskeletal   Abdominal   Peds  Hematology   Anesthesia Other Findings   Reproductive/Obstetrics                            Anesthesia Physical Anesthesia Plan  ASA: II  Anesthesia Plan: General   Post-op Pain Management:    Induction: Intravenous  Airway Management Planned: LMA  Additional Equipment:   Intra-op Plan:   Post-operative Plan: Extubation in OR  Informed Consent: I have reviewed the patients History and Physical, chart, labs and discussed the procedure including the risks, benefits and alternatives for the proposed anesthesia with the patient or authorized representative who has indicated his/her understanding and acceptance.   Dental advisory given  Plan Discussed with: CRNA, Anesthesiologist and Surgeon  Anesthesia Plan Comments:         Anesthesia Quick Evaluation

## 2015-10-19 NOTE — Progress Notes (Signed)
Around 1530 while in the room caring for patient, patient's heart rate dropped from 70's down to 37. Patient became nauseated and vomited a small amount of sputum. His heart rate then went up to 115 and before returning to baseline. MD made aware of incident. Pt remains a/ox4 and all vital signs are stable. Will continue to monitor patient.

## 2015-10-19 NOTE — Progress Notes (Addendum)
Post-op note  Subjective: The patient is doing well.  Mild nausea.  Still hypotensive but denies dizziness and tachycardia.  CCM managing BP.  Objective: Vital signs in last 24 hours: Temp:  [97.5 F (36.4 C)-98 F (36.7 C)] 97.5 F (36.4 C) (02/02 1330) Pulse Rate:  [70-104] 82 (02/02 1634) Resp:  [7-24] 14 (02/02 1634) BP: (70-130)/(45-93) 86/67 mmHg (02/02 1634) SpO2:  [94 %-100 %] 97 % (02/02 1634) Weight:  [82.101 kg (181 lb)-82.9 kg (182 lb 12.2 oz)] 82.9 kg (182 lb 12.2 oz) (02/02 1500)  Intake/Output from previous day:   Intake/Output this shift: Total I/O In: 5446.7 [I.V.:4246.7; Other:1200] Out: 5100 [Urine:4100; Blood:1000]  Physical Exam:  General: Alert and oriented. Abdomen: Soft, Nondistended. Urine: CBIs running with light pink output   Lab Results:  Recent Labs  10/17/15 1455 10/19/15 1341 10/19/15 1530  HGB 14.4 10.5* 11.2*  HCT 41.7 31.1* 33.1*    Assessment/Plan: POD#0   1) Continue to monitor  2) DVT prophy, IS, pain control, antiemetics prn  3) Hypotension per CCM   4) H/H currently stable; monitor with repeat H/H in am    LOS: 0 days   DANCY, AMANDA 10/19/2015, 4:43 PM  Addendum: Pt seen and examined on rounds. Agree with PA Dancy A/P.On my exam, Alert and oriented, Abd - soft, NT, ND. CBI running pretty slow and urine clear to light pink. Still on some Neo. Some fluctuations in HR. Really appreciate PCCM management of medical issues. Do not believe he has ongoing bleeding.

## 2015-10-19 NOTE — Anesthesia Procedure Notes (Signed)
Procedure Name: LMA Insertion Date/Time: 10/19/2015 10:21 AM Performed by: Glory Buff Pre-anesthesia Checklist: Patient identified, Emergency Drugs available, Suction available and Patient being monitored Patient Re-evaluated:Patient Re-evaluated prior to inductionOxygen Delivery Method: Circle system utilized Preoxygenation: Pre-oxygenation with 100% oxygen Intubation Type: IV induction LMA: LMA inserted LMA Size: 4.0 Number of attempts: 1 Placement Confirmation: positive ETCO2 Tube secured with: Tape

## 2015-10-19 NOTE — Progress Notes (Signed)
Dr. Junious Silk called regarding need for Neosynephrine for BP support. (1L EBL) Order for ICU bed and will call CCM.

## 2015-10-19 NOTE — Progress Notes (Signed)
Dr. Al Corpus aware of BP 70/45 with rapid flds. Fld bolus to continue and Neo drip ordered.

## 2015-10-19 NOTE — Progress Notes (Signed)
[facility] Progress Note Patient Name: [name] [name] DOB: 03-13-1946 MRN: [id number]   Date of Service  10/19/2015  HPI/Events of Note  Code status clarification  eICU Interventions  Full code     Intervention Category Minor Interventions: Routine modifications to care plan (e.g. PRN medications for pain, fever)  Simonne Maffucci 10/19/2015, 4:11 PM

## 2015-10-19 NOTE — Consult Note (Addendum)
Name: [name] [name] MRN: [id number] DOB: [birth year]    ADMISSION DATE:  10/19/2015 CONSULTATION DATE:  2/2  REFERRING MD :  Gaynelle Arabian   CHIEF COMPLAINT:  Hypotension   BRIEF PATIENT DESCRIPTION:  This is a 70 year old male w/ h/o lung cancer s/p RML lobectomy and chemo, Right colectomy for colon cancer, GI bleed, BPH w/ urinary freq/urg and RA (on chronic prednisone). Underwent TURP on 2/2. Surgical course was noted to be challenging and c/b sig blood loss. EBL recorded as 1 liter but this was w/ CBI infusing. He received 4 liters of crystalloid but remained hypotensive so Neo gtt was started. PCCM was asked to a/w management of his hypotension   SIGNIFICANT EVENTS    STUDIES:  12 lead EKG 2/2>>>   HISTORY OF PRESENT ILLNESS:  See above   PAST MEDICAL HISTORY :   has a past medical history of Cancer ([place]); Colon cancer ([place]) (01/24/2013); Cancer of middle lobe of lung ([place]) (01/24/2013); BPH (benign prostatic hyperplasia) (01/24/2013); HTN (hypertension), benign (01/24/2013); GERD (gastroesophageal reflux disease); Raynauds phenomenon; and Rheumatoid arthritis(714.0) (01/24/2013).  has past surgical history that includes Gastrectomy (1974); Lumbar disc surgery (2003); lung lobectomy (2007); tendons ruptured (2010); Colectomy (2004); Appendectomy; and Inguinal hernia repair (Right, 06/03/2014). Prior to Admission medications   Medication Sig Start Date End Date Taking? Authorizing Provider  Abatacept (ORENCIA IV) Inject into the vein every 30 (thirty) days.   Yes Historical Provider, MD  amLODipine (NORVASC) 2.5 MG tablet Take 2.5 mg by mouth daily with lunch.    Yes Historical Provider, MD  Calcium Carbonate-Vitamin D (CALCIUM + D PO) Take 600 mg by mouth daily.   Yes Historical Provider, MD  leflunomide (ARAVA) 10 MG tablet Take 10 mg by mouth daily.    Yes Historical Provider, MD  omeprazole (PRILOSEC) 20 MG capsule Take 20 mg by mouth daily.    Yes Historical Provider, MD    predniSONE (DELTASONE) 5 MG tablet Take 5 mg by mouth daily with breakfast.   Yes Historical Provider, MD  tamsulosin (FLOMAX) 0.4 MG CAPS capsule Take 0.4 mg by mouth daily.  06/12/13  Yes Historical Provider, MD  vitamin C (ASCORBIC ACID) 500 MG tablet Take 500 mg by mouth daily.   Yes Historical Provider, MD  vitamin E 400 UNIT capsule Take 400 Units by mouth daily.   Yes Historical Provider, MD  acetaminophen (TYLENOL) 500 MG tablet Take 1,000 mg by mouth every 6 (six) hours as needed for mild pain or moderate pain.    Historical Provider, MD  benzonatate (TESSALON) 100 MG capsule Take 1 capsule (100 mg total) by mouth 3 (three) times daily as needed. Patient not taking: Reported on 10/05/2015 08/15/15   Jan Fireman, PA-C  HYDROcodone-acetaminophen [facility]) 5-325 MG per tablet Take 1 tablet by mouth every 4 (four) hours as needed for moderate pain. Patient not taking: Reported on 10/05/2015 06/03/14   Johnathan Hausen, MD  oxyCODONE-acetaminophen (PERCOCET/ROXICET) 5-325 MG per tablet Take 1 tablet by mouth every 4 (four) hours as needed for moderate pain.  11/08/14   Historical Provider, MD   Allergies  Allergen Reactions  . Aspirin     REACTION: stomach bleeding ulcers  . Diphenhydramine Hcl     REACTION: restless leg,hyperactivity  . Remicade [Infliximab] Hives    FAMILY HISTORY:  family history includes Cancer in his brother and sister. There is no history of Colon cancer, Pancreatic cancer, or Stomach cancer. SOCIAL HISTORY:  reports that he quit smoking  about 9 years ago. He has never used smokeless tobacco. He reports that he drinks about 11.4 oz of alcohol per week. He reports that he does not use illicit drugs.  REVIEW OF SYSTEMS:   Constitutional: Negative for fever, chills, weight loss, malaise/fatigue and diaphoresis.  HENT: Negative for hearing loss, ear pain, nosebleeds, congestion, sore throat, neck pain, tinnitus and ear discharge.   Eyes: Negative for blurred vision,  double vision, photophobia, pain, discharge and redness.  Respiratory: Negative for cough, hemoptysis, sputum production, shortness of breath, wheezing and stridor.   Cardiovascular: Negative for chest pain, palpitations, orthopnea, claudication, leg swelling and PND.  Gastrointestinal: Negative for heartburn, nausea, vomiting, abdominal pain, diarrhea, constipation, blood in stool and melena.  Genitourinary: Negative for dysuria, urgency, frequency, hematuria and flank pain. CBI infusing  Musculoskeletal: Negative for myalgias, back pain, joint pain and falls.  Skin: Negative for itching and rash.  Neurological: Negative for dizziness, tingling, tremors, sensory change, speech change, focal weakness, seizures, loss of consciousness, weakness and headaches.  Endo/Heme/Allergies: Negative for environmental allergies and polydipsia. Does not bruise/bleed easily.  SUBJECTIVE:  No distress  VITAL SIGNS: Temp:  [97.5 F (36.4 C)-98 F (36.7 C)] 97.5 F (36.4 C) (02/02 1330) Pulse Rate:  [70-104] 78 (02/02 1420) Resp:  [7-20] 16 (02/02 1420) BP: (70-130)/(45-93) 104/74 mmHg (02/02 1420) SpO2:  [94 %-100 %] 99 % (02/02 1420) Weight:  [181 lb (82.101 kg)] 181 lb (82.101 kg) (02/02 0903)  PHYSICAL EXAMINATION: General:  70 year old male resting in bed. No c/o and in no distress.  Neuro:  Awake, alert, no focal def  HEENT:  MMM, no JVd.  Cardiovascular:  RRR w/out MRG Lungs:  Clear, no accessory muscle use, no wheeze  Abdomen:  Soft, not tender + bowel sounds  Musculoskeletal:  Equal st and bulk  Skin:  Warm, dry and no edema    Recent Labs Lab 10/17/15 1455 10/19/15 1341  NA 135 139  K 4.0 4.0  CL 100* 105  CO2 26 24  BUN 7 6  CREATININE 0.78 0.75  GLUCOSE 102* 150*    Recent Labs Lab 10/17/15 1455 10/19/15 1341  HGB 14.4 10.5*  HCT 41.7 31.1*  WBC 6.2 4.6  PLT 179 146*   No results for input(s): CKTOTAL, CKMB, TROPONINI in the last 168 hours.  Dg Chest 2  View  10/17/2015  CLINICAL DATA:  Preop for prostate/urethral surgery EXAM: CHEST  2 VIEW COMPARISON:  01/18/2013 CT, 01/18/2015 chest radiograph FINDINGS: Normal cardiac silhouette. Band of scarring and consolidation extending from the RIGHT hilum is not changed from prior. There is volume loss in the RIGHT hemi thorax. No overt pulmonary edema. No pneumothorax. Scarring and emphysema in the RIGHT upper lobe IMPRESSION: 1. No acute cardiopulmonary findings. 2. Chronic sclerotic band of consolidation extending from the RIGHT hilum. 3. Volume loss in the RIGHT hemi thorax. Electronically Signed   By: Suzy Bouchard M.D.   On: 10/17/2015 15:17    ASSESSMENT / PLAN:  Cardiovascular  A: Post-op Circulatory shock. Favor adrenal insufficiency and hypovolemia/hemorrhagic. His initial post-op hgb is 10.5 down from 14.4, he had an ebl of 1 Liter... But this was also noted via CBI so may not be completely accurate. He is however on chronic prednisone for RA (for years) P: Cont IVF Type and screen, Recheck CBC at 1600-->if cont to drop OR pressor needs increase would consider x-fusion Stress dose steroids  Hold all anticoagulation and home antihypertensives Ck EKG and cycle one  set of CEs.   Pulmonary: A: No acute  Remote h/o lung cancer & is s/p RML resection and chemo P: Wean FIo2 Pulse ox Routine pulm hygiene   Renal A: H/o BPH, now s/p TURP  Hematuria s/p TURP P: CBI per urology  Cont IVFs Avoid hypotension (MAP goal >65) Strict I&O  Heme A: Acute blood loss anemia P: Serial CBC Hold anticoagulation  Transfusion trigger noted above in CV section  GI A: No acute P: Adv diet as tol   Endocrine A: DM w/ hyperglycemia  Relative adrenal insuff (chronic pred for RA) P: ssi  solucortef '50mg'$  q 6 while hypotensive; then taper to off and back to oral pred   Neuro A:  Post-op pain P: PRN analgesia  ID A: No acute P: Monitor fever and WBC curve   Discussion  S/p  TURP, sig blood loss but hgb >10. Chronically on steroids. Suspect multi-factorial residual anesthesia, blood loss and adrenal insuff. Will follow in ICU-->treat w/ IV solucortef, cycle CEs, 12 lead, and type and screen. Currently think we can hold off from transfusion as hgb is > 10. Hope w/ steroids we can wean the neo off.    Erick Colace ACNP-BC [place] Pager # [phone] OR # [phone] if no answer  10/19/2015, 2:23 PM   STAFF NOTE: I, Merrie Roof, MD FACP have personally reviewed patient's available data, including medical history, events of note, physical examination and test results as part of my evaluation. I have discussed with resident/NP and other care providers such as pharmacist, RN and RRT. In addition, I personally evaluated patient and elicited key findings of: shock post op, no distress, no pain reported, 1 liter Blood loss, 4 liters in, etiology unclear, r/o sirs response post complicated procedure, at risk bacterial translocation with immune status, presume adrenal insuff, neo to MAP 60, obtain lactic level, ecg, trop to r/o peri op ischemia, cbc to follow, add florinef to stress steroids, pcxr to assess volume status, if remains on neo max then would add vasopressin and place line with cvp, hope to avoid this, low threshold to add IV empiric abx with immunosuppression status, send BC, change dilauded to fent to avoid histamine release The patient is critically ill with multiple organ systems failure and requires high complexity decision making for assessment and support, frequent evaluation and titration of therapies, application of advanced monitoring technologies and extensive interpretation of multiple databases.   Critical Care Time devoted to patient care services described in this note is 30 Minutes. This time reflects time of care of this signee: Merrie Roof, MD FACP. This critical care time does not reflect procedure time, or teaching time  or supervisory time of PA/NP/Med student/Med Resident etc but could involve care discussion time. Rest per NP/medical resident whose note is outlined above and that I agree with   Lavon Paganini. Titus Mould, MD, [place] Pgr: [facility] 10/19/2015 5:15 PM

## 2015-10-19 NOTE — Transfer of Care (Signed)
Immediate Anesthesia Transfer of Care Note  Patient: [name] [name]  Procedure(s) Performed: Procedure(s): TRANSURETHRAL RESECTION OF THE PROSTATE (TURP) (N/A) CYSTOSCOPY (N/A)  Patient Location: PACU  Anesthesia Type:General  Level of Consciousness: awake, alert  and oriented  Airway & Oxygen Therapy: Patient Spontanous Breathing and Patient connected to face mask oxygen  Post-op Assessment: Report given to RN and Post -op Vital signs reviewed and stable  Post vital signs: Reviewed and stable  Last Vitals:  Filed Vitals:   10/19/15 0902  BP: 130/93  Pulse: 104  Temp: 36.4 C  Resp: 18    Complications: No apparent anesthesia complications

## 2015-10-19 NOTE — Op Note (Signed)
Pre-operative diagnosis : BPH  Postoperative diagnosis:  Same  Operation:  Cystourethroscopy, TURP  Surgeon:  S. Gaynelle Arabian, MD  First assistant:  None  Anesthesia: Gen. LMA  Preparation:  After appropriate preanesthesia, the patient was brought the operative room, placed on the operating table in the dorsal supine position where general LMA anesthesia was introduced. He was then replaced in the dorsal lithotomy position with the penis was prepped with Betadine solution and draped in usual fashion. The arm and was double checked. The history was double checked.  Review history:  Problems  1. Benign localized hyperplasia of prostate with urinary obstruction (N40.1,N13.8)  Assessed By: Carolan Clines (Urology); Last Assessed: 17 Jul 2015 2. 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 Nocturia (R35.1)  Assessed By: Carolan Clines (Urology); Last Assessed: 17 Jul 2015 4. Urinary frequency (R35.0)  History of Present Illness    70 yo male returns today for a cystoscopy, flowrate, PVR & prostate u/s for possible Urolift candidate. Hx of BPH with frequency and urgency. He has hx of nodular prostate on exam. Last psa 2013-1.76. IPSS now 15. Cannot remember to take tamsulosin every day.     He has RA (Dr. Jefm Bryant); and is post RML lobectomy, chemotherapy for Ca Lung; and R colectomy for Ca colon. He has a remote hx of bleeding ulcer (post exploration per Dr., Burgess Amor).    He has BPH, and is voiding well. He takes tamsulosin '4mg'$ /day. Previous PSA=2.13 in 2010, and Vit D=41.5.   Hx of Right inguinal hernia, s/p Rt inguinal hernia repair by Dr. Hassell Done on 06/03/14 .      08/03/12 PSA - 1.76    Statement of  Likelihood of Success: Excellent. TIME-OUT observed.:  Procedure:  Cystourethroscopy was accomplished, the patient was noted to have bilobar BPH. Trabeculation was  noted with trabeculation, and cellule formation. 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  Technical difficulties were noted with the tower, resulting in change of the scope, light source, camera, and light cord. Consultation with the [facility] technician was unable to resolve the issue. I was able to adequately see the lateral lobes for resection, resection was accomplished in the 11:00 to the 7:00 position, and from the 1:00 to 5:00 position. Resection was accomplished from the 7:00 to the 5:00 position. As well. Tissue was evaluated with an bladder. The resection was quite bloody, and required extensive amount of cauterization. Because of 5 large amount of bleeding, I elected to place a 24 Simplastic 3-way Foley catheter. This was placed over a guidewire in the bladder, to ensure that it was in the proper position. From this, was irrigated, and placed to traction and continuous irrigation. The hemoglobin was checked in the operating room, and was noted to be greater than 10. The patient was awakened and taken to recovery room in good condition. He'll be followed with hemoglobin, and possible type and screen as needed.

## 2015-10-20 DIAGNOSIS — N400 Enlarged prostate without lower urinary tract symptoms: Secondary | ICD-10-CM | POA: Diagnosis present

## 2015-10-20 LAB — GLUCOSE, CAPILLARY
GLUCOSE-CAPILLARY: 113 mg/dL — AB (ref 65–99)
GLUCOSE-CAPILLARY: 107 mg/dL — AB (ref 65–99)
Glucose-Capillary: 118 mg/dL — ABNORMAL HIGH (ref 65–99)
Glucose-Capillary: 114 mg/dL — ABNORMAL HIGH (ref 65–99)
Glucose-Capillary: 99 mg/dL (ref 65–99)

## 2015-10-20 LAB — CBC
HEMATOCRIT: 29.9 % — AB (ref 39.0–52.0)
Hemoglobin: 10.1 g/dL — ABNORMAL LOW (ref 13.0–17.0)
MCH: 31.5 pg (ref 26.0–34.0)
MCHC: 33.8 g/dL (ref 30.0–36.0)
MCV: 93.1 fL (ref 78.0–100.0)
Platelets: 143 10*3/uL — ABNORMAL LOW (ref 150–400)
RBC: 3.21 MIL/uL — AB (ref 4.22–5.81)
RDW: 15.7 % — ABNORMAL HIGH (ref 11.5–15.5)
WBC: 8 10*3/uL (ref 4.0–10.5)

## 2015-10-20 LAB — VITAMIN B12: VITAMIN B 12: 169 pg/mL — AB (ref 180–914)

## 2015-10-20 LAB — HEMOGLOBIN A1C
HEMOGLOBIN A1C: 5.6 % (ref 4.8–5.6)
MEAN PLASMA GLUCOSE: 114 mg/dL

## 2015-10-20 LAB — TROPONIN I

## 2015-10-20 MED ORDER — TRAMADOL-ACETAMINOPHEN 37.5-325 MG PO TABS: 1.0000 | ORAL_TABLET | Freq: Four times a day (QID) | ORAL | Status: DC | PRN

## 2015-10-20 MED ORDER — INSULIN ASPART 100 UNIT/ML ~~LOC~~ SOLN: 0.0000 [IU] | Freq: Three times a day (TID) | SUBCUTANEOUS | Status: DC

## 2015-10-20 MED ORDER — TRIMETHOPRIM 100 MG PO TABS: 100.0000 mg | ORAL_TABLET | ORAL | Status: DC

## 2015-10-20 MED ORDER — OXYBUTYNIN CHLORIDE 5 MG PO TABS: ORAL_TABLET | ORAL | Status: DC

## 2015-10-20 NOTE — Discharge Instructions (Signed)
Post transurethral resection of the prostate (TURP) instructions ° °Your recent prostate surgery requires very special post hospital care. Despite the fact that no skin incisions were used the area around the prostate incision is quite raw and is covered with a scab to promote healing and prevent bleeding. Certain cautions are needed to assure that the scab is not disturbed of the next 2-3 weeks while the healing proceeds. ° °Because the raw surface in your prostate and the irritating effects of urine you may expect frequency of urination and/or urgency (a stronger desire to urinate) and perhaps even getting up at night more often. This will usually resolve or improve slowly over the healing period. You may see some blood in your urine over the first 6 weeks. Do not be alarmed, even if the urine was clear for a while. Get off your feet and drink lots of fluids until clearing occurs. If you start to pass clots or don't improve call us. ° °Diet: ° °You may return to your normal diet immediately. Because of the raw surface of your bladder, alcohol, spicy foods, foods high in acid and drinks with caffeine may cause irritation or frequency and should be used in moderation. To keep your urine flowing freely and avoid constipation, drink plenty of fluids during the day (8-10 glasses). Tip: Avoid cranberry juice because it is very acidic. ° °Activity: ° °Your physical activity doesn't need to be restricted. However, if you are very active, you may see some blood in the urine. We suggest that you reduce your activity under the circumstances until the bleeding has stopped. ° °Bowels: ° °It is important to keep your bowels regular during the postoperative period. Straining with bowel movements can cause bleeding. A bowel movement every other day is reasonable. Use a mild laxative if needed, such as milk of magnesia 2-3 tablespoons, or 2 Dulcolax tablets. Call if you continue to have problems. If you had been taking narcotics  for pain, before, during or after your surgery, you may be constipated. Take a laxative if necessary. ° °Medication: ° °You should resume your pre-surgery medications unless told not to. In addition you may be given an antibiotic to prevent or treat infection. Antibiotics are not always necessary. All medication should be taken as prescribed until the bottles are finished unless you are having an unusual reaction to one of the drugs. ° ° ° ° °Problems you should report to us: ° °a. Fever greater than 101°F. °b. Heavy bleeding, or clots (see notes above about blood in urine). °c. Inability to urinate. °d. Drug reactions (hives, rash, nausea, vomiting, diarrhea). °e. Severe burning or pain with urination that is not improving. ° °

## 2015-10-20 NOTE — Discharge Summary (Signed)
Physician Discharge Summary  Patient ID: [name] [name] MRN: [id number] DOB/AGE: [birth year] 70 y.o.  Admit date: 10/19/2015 Discharge date: 10/20/2015  Admission Diagnoses: BENIGN LOCALIZED HYPERPLASIA  Discharge Diagnoses:  Active Problems:   Benign prostatic hyperplasia   Shock ([place])   Benign prostatic hypertrophy   Discharged Condition: Stable  Hospital Course: TURP                                Post operative Hypotension Significant Diagnostic Studies: Dg Chest Port 1 View  10/19/2015  CLINICAL DATA:  Cough. EXAM: PORTABLE CHEST 1 VIEW COMPARISON:  01/28/2013 FINDINGS: Normal heart size. No pleural effusion or edema. Right mid lung perihilar opacity identified compatible with a changes of external beam radiation. Right apical nodule is identified measuring 1.7 cm and appears unchanged from previous study. No superimposed airspace consolidation identified. IMPRESSION: No acute cardiopulmonary abnormalities identified. Electronically Signed   By: Kerby Moors M.D.   On: 10/19/2015 17:40    Discharge Exam: Blood pressure 137/73, pulse 92, temperature 98.1 F (36.7 C), temperature source Oral, resp. rate 22, height '5\' 9"'$  (1.753 m), weight 84.2 kg (185 lb 10 oz), SpO2 96 %.   Disposition: 01-Home or Self Care     Medication List    TAKE these medications        oxybutynin 5 MG tablet  Commonly known as:  DITROPAN  Use 3x/day for bladder spasms. Caution: dry eyes, dry mouth, constipation,  confusion     traMADol-acetaminophen 37.5-325 MG tablet  Commonly known as:  ULTRACET  Take 1 tablet by mouth every 6 (six) hours as needed.     trimethoprim 100 MG tablet  Commonly known as:  TRIMPEX  Take 1 tablet (100 mg total) by mouth 1 day or 1 dose.      ASK your doctor about these medications        acetaminophen 500 MG tablet  Commonly known as:  TYLENOL  Take 1,000 mg by mouth every 6 (six) hours as needed for mild pain or moderate pain.     amLODipine 2.5 MG tablet   Commonly known as:  NORVASC  Take 2.5 mg by mouth daily with lunch.     benzonatate 100 MG capsule  Commonly known as:  TESSALON  Take 1 capsule (100 mg total) by mouth 3 (three) times daily as needed.     CALCIUM + D PO  Take 600 mg by mouth daily.     HYDROcodone-acetaminophen 5-325 MG tablet  Commonly known as:  NORCO  Take 1 tablet by mouth every 4 (four) hours as needed for moderate pain.     leflunomide 10 MG tablet  Commonly known as:  ARAVA  Take 10 mg by mouth daily.     omeprazole 20 MG capsule  Commonly known as:  PRILOSEC  Take 20 mg by mouth daily.     ORENCIA IV  Inject into the vein every 30 (thirty) days.     oxyCODONE-acetaminophen 5-325 MG tablet  Commonly known as:  PERCOCET/ROXICET  Take 1 tablet by mouth every 4 (four) hours as needed for moderate pain.     predniSONE 5 MG tablet  Commonly known as:  DELTASONE  Take 5 mg by mouth daily with breakfast.     tamsulosin 0.4 MG Caps capsule  Commonly known as:  FLOMAX  Take 0.4 mg by mouth daily.     vitamin C 500 MG tablet  Commonly known as:  ASCORBIC ACID  Take 500 mg by mouth daily.     vitamin E 400 UNIT capsule  Take 400 Units by mouth daily.           Follow-up Information    Follow up with Ailene Rud, MD.   Specialty:  Urology   Why:  call Kim at [phone] Monday morning for appointment for voiiding trial;   Contact information:   [address] [place]  [phone]     1. Leave foley Catheter in Place over weekend and RTC Monday for catheter removal. Call [phone] for appointment for voiding trial. Maudie Mercury).   Signed: Ailene Rud 10/20/2015, 4:40 PM

## 2015-10-20 NOTE — Care Management Note (Signed)
Case Management Note  Patient Details  Name: [name] [name] MRN: [id number] Date of Birth: 1946/08/16  Subjective/Objective:  70 y/o m admitted w/Prostate Ca. S/p TURP. From home.                  Action/Plan:d/c plan home.   Expected Discharge Date:                  Expected Discharge Plan:  Home/Self Care  In-House Referral:     Discharge planning Services  CM Consult  Post Acute Care Choice:    Choice offered to:     DME Arranged:    DME Agency:     HH Arranged:    HH Agency:     Status of Service:  In process, will continue to follow  Medicare Important Message Given:    Date Medicare IM Given:    Medicare IM give by:    Date Additional Medicare IM Given:    Additional Medicare Important Message give by:     If discussed at [place] of Stay Meetings, dates discussed:    Additional Comments:  Dessa Phi, RN 10/20/2015, 4:00 PM

## 2015-10-20 NOTE — Progress Notes (Signed)
Name: [name] [name] MRN: [id number] DOB: 1946-03-24    ADMISSION DATE:  10/19/2015 CONSULTATION DATE:  2/2  REFERRING MD :  Gaynelle Arabian   CHIEF COMPLAINT:  Hypotension   BRIEF PATIENT DESCRIPTION:  This is a 70 year old male w/ h/o lung cancer s/p RML lobectomy and chemo, Right colectomy for colon cancer, GI bleed, BPH w/ urinary freq/urg and RA (on chronic prednisone). Underwent TURP on 2/2. Surgical course was noted to be challenging and c/b sig blood loss. EBL recorded as 1 liter but this was w/ CBI infusing. He received 4 liters of crystalloid but remained hypotensive so Neo gtt was started. PCCM was asked to a/w management of his hypotension   SIGNIFICANT EVENTS   STUDIES:  12 lead EKG 2/2: neg    SUBJECTIVE:  No distress  VITAL SIGNS: Temp:  [97.3 F (36.3 C)-98.1 F (36.7 C)] 98.1 F (36.7 C) (02/03 0400) Pulse Rate:  [66-99] 95 (02/03 0800) Resp:  [7-24] 21 (02/03 0800) BP: (70-149)/(45-92) 149/92 mmHg (02/03 0800) SpO2:  [91 %-100 %] 96 % (02/03 0800) Weight:  [182 lb 12.2 oz (82.9 kg)-185 lb 10 oz (84.2 kg)] 185 lb 10 oz (84.2 kg) (02/03 0430)  PHYSICAL EXAMINATION: General:  70 year old male resting in bed. No c/o and in no distress.  Neuro:  Awake, alert, no focal def  HEENT:  MMM, no JVd.  Cardiovascular:  RRR w/out MRG Lungs:  Clear, no accessory muscle use, no wheeze  Abdomen:  Soft, not tender + bowel sounds  Musculoskeletal:  Equal st and bulk  Skin:  Warm, dry and no edema    Recent Labs Lab 10/17/15 1455 10/19/15 1341  NA 135 139  K 4.0 4.0  CL 100* 105  CO2 26 24  BUN 7 6  CREATININE 0.78 0.75  GLUCOSE 102* 150*    Recent Labs Lab 10/19/15 1530 10/19/15 2035 10/20/15 0245  HGB 11.2* 10.8* 10.1*  HCT 33.1* 32.5* 29.9*  WBC 7.6 10.9* 8.0  PLT 154 158 143*    Recent Labs Lab 10/19/15 1530 10/19/15 2035 10/20/15 0245  TROPONINI <0.03 <0.03 <0.03   CBG (last 3)   Recent Labs  10/19/15 2105 10/19/15 2330 10/20/15 0434   GLUCAP 138* 127* 118*    Dg Chest Port 1 View  10/19/2015  CLINICAL DATA:  Cough. EXAM: PORTABLE CHEST 1 VIEW COMPARISON:  01/28/2013 FINDINGS: Normal heart size. No pleural effusion or edema. Right mid lung perihilar opacity identified compatible with a changes of external beam radiation. Right apical nodule is identified measuring 1.7 cm and appears unchanged from previous study. No superimposed airspace consolidation identified. IMPRESSION: No acute cardiopulmonary abnormalities identified. Electronically Signed   By: Kerby Moors M.D.   On: 10/19/2015 17:40   ASSESSMENT / PLAN:  Post-op Circulatory shock. Favor adrenal insufficiency and hypovolemia/hemorrhagic. Favor primarily adrenal insuff from long term steroids. Hgb stable. Now off pressors/shock resolved  Plan Transition back to pred If BP stable can resume Norvasc 2/4 Change IVF to D5 1/2 NS at 78m/hr  H/o BPH, now s/p TURP  Hematuria s/p TURP Plan: CBI per urology  Decrease IVFs Avoid hypotension (MAP goal >65) Strict I&O  Acute blood loss anemia Plan: Serial CBC Hold anticoagulation   DM w/ hyperglycemia  Plan: ssi   PErick ColaceACNP-BC LSweden ValleyPager # [phone] # [phone] no answer    Discussion  S/p TURP, sig blood loss but hgb >10. Chronically on steroids. Suspect multi-factorial  residual anesthesia, blood loss and adrenal insuff (mostly adrenal insuff). We will dc solucortef, decrease IVFs, move to ICU. Urology has requested B12/thiamine eval. Will place this order for them. We will s/o. He is cleared for transfer to the medical ward.   Erick Colace ACNP-BC [place] Pager # [phone] OR # [phone] if no answer   10/20/2015 9:20 AM

## 2015-10-20 NOTE — Progress Notes (Signed)
Urology Progress Note  1 Day Post-Op   Subjective: POD 1: post TURP    O:     Urine bloody this AM clearing with  Traction, hand irrigation. Pt had hypovolemia, intra-op, responded to volume.    No acute urologic events overnight. Ambulation:   positive Flatus:    negative Bowel movement  negative  Pain: some relief  Objective:  Blood pressure 137/73, pulse 92, temperature 98.1 F (36.7 C), temperature source Oral, resp. rate 22, height '5\' 9"'$  (1.753 m), weight 84.2 kg (185 lb 10 oz), SpO2 96 %.  Physical Exam:  General:  No acute distress, awake Resp: clear to auscultation bilaterally Genitourinary:  Normal GU Foley: bloody urine, clearing with hand irrigation    I/O last 3 completed shifts: In: 18946.5 [I.V.:6746.5; Other:12000; IV Piggyback:200] Out: 91791 [Urine:12550; Blood:1000]  Recent Labs     10/19/15  2035  10/20/15  0245  HGB  10.8*  10.1*  WBC  10.9*  8.0  PLT  158  143*    Recent Labs     10/17/15  1455  10/19/15  1341  NA  135  139  K  4.0  4.0  CL  100*  105  CO2  26  24  BUN  7  6  CREATININE  0.78  0.75  CALCIUM  9.1  7.9*  GFRNONAA  >60  >60  GFRAA  >60  >60     No results for input(s): INR, APTT in the last 72 hours.  Invalid input(s): PT   Invalid input(s): ABG  Assessment/Plan:  Catheter not removed. Will keep catheter in place until next week.  Keep in hospuital today. ? D/c this weekend if appropriate Re-ck Hg/ in Am.

## 2015-10-21 LAB — HEMOGLOBIN AND HEMATOCRIT, BLOOD
HEMATOCRIT: 28 % — AB (ref 39.0–52.0)
HEMOGLOBIN: 9.5 g/dL — AB (ref 13.0–17.0)

## 2015-10-21 LAB — GLUCOSE, CAPILLARY: GLUCOSE-CAPILLARY: 102 mg/dL — AB (ref 65–99)

## 2015-10-21 NOTE — Progress Notes (Signed)
Pt discharged to home. Prescriptions, reasons to return to ED/MD, follow up appts, and foley care reviewed with pt. Pt and wife instructed on leg bag/night bag and teachback method used to assure understandings. PIV removed wtihout complication. Pt escorted off of unit to care of wife via wheelchair and PCT.

## 2015-10-21 NOTE — Progress Notes (Signed)
Looks good  Home today Instructions detailed

## 2015-10-23 LAB — FOLATE RBC
FOLATE, HEMOLYSATE: 404.6 ng/mL
FOLATE, RBC: 1461 ng/mL (ref >498)
Hematocrit: 27.7 % — ABNORMAL LOW (ref 37.5–51.0)

## 2015-10-24 ENCOUNTER — Emergency Department (HOSPITAL_COMMUNITY)
Admission: EM | Admit: 2015-10-24 | Discharge: 2015-10-24 | Disposition: A | Discharge status: Home / Self Care | Payer: Medicare Other | Attending: Emergency Medicine | Admitting: Emergency Medicine

## 2015-10-24 ENCOUNTER — Encounter (HOSPITAL_COMMUNITY): Payer: Self-pay | Admitting: *Deleted

## 2015-10-24 DIAGNOSIS — I10 Essential (primary) hypertension: Secondary | ICD-10-CM | POA: Insufficient documentation

## 2015-10-24 DIAGNOSIS — Z85038 Personal history of other malignant neoplasm of large intestine: Secondary | ICD-10-CM | POA: Insufficient documentation

## 2015-10-24 DIAGNOSIS — R55 Syncope and collapse: Secondary | ICD-10-CM | POA: Diagnosis present

## 2015-10-24 DIAGNOSIS — M069 Rheumatoid arthritis, unspecified: Secondary | ICD-10-CM | POA: Diagnosis not present

## 2015-10-24 DIAGNOSIS — N400 Enlarged prostate without lower urinary tract symptoms: Secondary | ICD-10-CM | POA: Insufficient documentation

## 2015-10-24 DIAGNOSIS — Z85118 Personal history of other malignant neoplasm of bronchus and lung: Secondary | ICD-10-CM | POA: Diagnosis not present

## 2015-10-24 DIAGNOSIS — E876 Hypokalemia: Secondary | ICD-10-CM | POA: Insufficient documentation

## 2015-10-24 DIAGNOSIS — K219 Gastro-esophageal reflux disease without esophagitis: Secondary | ICD-10-CM | POA: Insufficient documentation

## 2015-10-24 LAB — BASIC METABOLIC PANEL
Anion gap: 9 (ref 5–15)
BUN: 7 mg/dL (ref 6–20)
CALCIUM: 8.5 mg/dL — AB (ref 8.9–10.3)
CHLORIDE: 102 mmol/L (ref 101–111)
CO2: 25 mmol/L (ref 22–32)
CREATININE: 0.84 mg/dL (ref 0.61–1.24)
GFR calc Af Amer: >60 mL/min (ref >60)
GFR calc non Af Amer: >60 mL/min (ref >60)
GLUCOSE: 109 mg/dL — AB (ref 65–99)
Potassium: 2.9 mmol/L — ABNORMAL LOW (ref 3.5–5.1)
Sodium: 136 mmol/L (ref 135–145)

## 2015-10-24 LAB — CBC WITH DIFFERENTIAL/PLATELET
Basophils Absolute: 0 10*3/uL (ref 0.0–0.1)
Basophils Relative: 0 %
Eosinophils Absolute: 0 10*3/uL (ref 0.0–0.7)
Eosinophils Relative: 1 %
HEMATOCRIT: 27.8 % — AB (ref 39.0–52.0)
Hemoglobin: 9.5 g/dL — ABNORMAL LOW (ref 13.0–17.0)
LYMPHS ABS: 0.6 10*3/uL — AB (ref 0.7–4.0)
LYMPHS PCT: 13 %
MCH: 30.9 pg (ref 26.0–34.0)
MCHC: 34.2 g/dL (ref 30.0–36.0)
MCV: 90.6 fL (ref 78.0–100.0)
MONO ABS: 0.5 10*3/uL (ref 0.1–1.0)
MONOS PCT: 10 %
NEUTROS ABS: 3.8 10*3/uL (ref 1.7–7.7)
Neutrophils Relative %: 76 %
Platelets: 163 10*3/uL (ref 150–400)
RBC: 3.07 MIL/uL — ABNORMAL LOW (ref 4.22–5.81)
RDW: 16.1 % — AB (ref 11.5–15.5)
WBC: 5 10*3/uL (ref 4.0–10.5)

## 2015-10-24 LAB — CULTURE, BLOOD (ROUTINE X 2)
CULTURE: NO GROWTH
Culture: NO GROWTH

## 2015-10-24 MED ORDER — POTASSIUM CHLORIDE 10 MEQ/100ML IV SOLN
10.0000 meq | Freq: Once | INTRAVENOUS | Status: AC
  Administered 2015-10-24: 10 meq via INTRAVENOUS
  Filled 2015-10-24: qty 100

## 2015-10-24 MED ORDER — SODIUM CHLORIDE 0.9 % IV BOLUS (SEPSIS)
1000.0000 mL | Freq: Once | INTRAVENOUS | Status: AC
Start: 2015-10-24 — End: 2015-10-24
  Administered 2015-10-24: 1000 mL via INTRAVENOUS

## 2015-10-24 MED ORDER — POTASSIUM CHLORIDE CRYS ER 20 MEQ PO TBCR
40.0000 meq | EXTENDED_RELEASE_TABLET | Freq: Once | ORAL | Status: AC
  Administered 2015-10-24: 40 meq via ORAL
  Filled 2015-10-24: qty 2

## 2015-10-24 NOTE — ED Notes (Signed)
Pt was able to void 800cc dark red urine prior to leaving

## 2015-10-24 NOTE — ED Notes (Signed)
Pt was continuously monitored while in ED with q1h BP.  Monitor deleted before could enter additional BP and VS.  Pt tolerated water and snacks

## 2015-10-24 NOTE — ED Provider Notes (Signed)
CSN: [id number]     Arrival date & time 10/24/15  0935 History   First MD Initiated Contact with Patient 10/24/15 [phone]     Chief Complaint  Patient presents with  . Near Syncope     (Consider location/radiation/quality/duration/timing/severity/associated sxs/prior Treatment) Patient is a 70 y.o. male presenting with near-syncope. The history is provided by the patient and the spouse (his urologist).  Near Syncope This is a recurrent problem. The current episode started less than 1 hour ago. The problem occurs constantly. The problem has been resolved. Pertinent negatives include no chest pain, no abdominal pain, no headaches and no shortness of breath. Nothing aggravates the symptoms. The symptoms are relieved by lying down. He has tried nothing for the symptoms. The treatment provided moderate relief.   70 yo M with a chief complaint of near syncope. Patient was at his urologist office today for a trial to void after having a TURP. Patient's surgery was Located by significant blood loss during the surgery involving an ICU stay. Today upon removal of the catheter patient felt lightheaded. He is able to urinate a little bit and then felt like his capacity out. Family feels like he is completely out and had a couple of myoclonic jerks. Urinated on himself. Was neatly back to baseline. Denies loss of bowel. Patient states he has not had anything to eat or drink this morning. Denies chest pain shortness of breath headache. Has had multiple episodes like this in the past usually happens about twice a year. Unsure of the etiology. Denies history of heart failure.  Past Medical History  Diagnosis Date  . Cancer ([place])     lung/colon ca  . Colon cancer ([place]) 01/24/2013    [id number]  right colon  R hemicolectomy   . Cancer of middle lobe of lung ([place]) 01/24/2013    4.5 cm RML w associated R hilar adenopathy S/P chemo/RT then Surgery 8/08  . BPH (benign prostatic hyperplasia) 01/24/2013  . HTN (hypertension),  benign 01/24/2013  . GERD (gastroesophageal reflux disease)   . Raynauds phenomenon   . Rheumatoid arthritis(714.0) 01/24/2013    Since age 69   Past Surgical History  Procedure Laterality Date  . Gastrectomy  1974    bleeding ulcers  . Lumbar disc surgery  2003  . Lung lobectomy  2007    right  . Tendons ruptured  2010    right-hand  . Colectomy  2004    right-hemi  . Appendectomy    . Inguinal hernia repair Right 06/03/2014    Procedure: HERNIA REPAIR INGUINAL ADULT;  Surgeon: Kaylyn Lim, MD;  Location: [facility];  Service: General;  Laterality: Right;  . Transurethral resection of prostate N/A 10/19/2015    Procedure: TRANSURETHRAL RESECTION OF THE PROSTATE (TURP);  Surgeon: Carolan Clines, MD;  Location: [facility];  Service: Urology;  Laterality: N/A;  . Cystoscopy N/A 10/19/2015    Procedure: CYSTOSCOPY;  Surgeon: Carolan Clines, MD;  Location: [facility];  Service: Urology;  Laterality: N/A;   Family History  Problem Relation Age of Onset  . Colon cancer Neg Hx   . Pancreatic cancer Neg Hx   . Stomach cancer Neg Hx   . Cancer Sister   . Cancer Brother    Social History  Substance Use Topics  . Smoking status: Former Smoker    Quit date: 05/30/2006  . Smokeless tobacco: Never Used  . Alcohol Use: 11.4 oz/week    12 Cans of beer, 7 Shots of liquor  per week    Review of Systems  Constitutional: Negative for fever and chills.  HENT: Negative for congestion and facial swelling.   Eyes: Negative for discharge and visual disturbance.  Respiratory: Negative for shortness of breath.   Cardiovascular: Positive for near-syncope. Negative for chest pain and palpitations.  Gastrointestinal: Negative for vomiting, abdominal pain and diarrhea.  Musculoskeletal: Negative for myalgias and arthralgias.  Skin: Negative for color change and rash.  Neurological: Positive for syncope. Negative for tremors and headaches.  Psychiatric/Behavioral: Negative for confusion  and dysphoric mood.      Allergies  Aspirin; Diphenhydramine hcl; and Remicade  Home Medications   Prior to Admission medications   Medication Sig Start Date End Date Taking? Authorizing Provider  Abatacept (ORENCIA IV) Inject into the vein every 30 (thirty) days.   Yes Historical Provider, MD  acetaminophen (TYLENOL) 500 MG tablet Take 1,000 mg by mouth every 6 (six) hours as needed for mild pain or moderate pain.   Yes Historical Provider, MD  amLODipine (NORVASC) 2.5 MG tablet Take 2.5 mg by mouth daily with lunch.    Yes Historical Provider, MD  Calcium Carbonate-Vitamin D (CALCIUM + D PO) Take 600 mg by mouth daily.   Yes Historical Provider, MD  leflunomide (ARAVA) 10 MG tablet Take 10 mg by mouth daily.    Yes Historical Provider, MD  omeprazole (PRILOSEC) 20 MG capsule Take 20 mg by mouth daily.    Yes Historical Provider, MD  oxybutynin (DITROPAN) 5 MG tablet Use 3x/day for bladder spasms. Caution: dry eyes, dry mouth, constipation,  confusion Patient taking differently: Take 5 mg by mouth 3 (three) times daily as needed for bladder spasms. Use 3x/day for bladder spasms. Caution: dry eyes, dry mouth, constipation,  confusion 10/20/15  Yes Carolan Clines, MD  predniSONE (DELTASONE) 5 MG tablet Take 5 mg by mouth daily with breakfast.   Yes Historical Provider, MD  tamsulosin (FLOMAX) 0.4 MG CAPS capsule Take 0.4 mg by mouth daily.  06/12/13  Yes Historical Provider, MD  traMADol-acetaminophen (ULTRACET) 37.5-325 MG tablet Take 1 tablet by mouth every 6 (six) hours as needed. Patient taking differently: Take 1 tablet by mouth every 6 (six) hours as needed for moderate pain.  10/20/15  Yes Carolan Clines, MD  trimethoprim (TRIMPEX) 100 MG tablet Take 1 tablet (100 mg total) by mouth 1 day or 1 dose. Patient taking differently: Take 100 mg by mouth daily.  10/20/15  Yes Carolan Clines, MD  vitamin C (ASCORBIC ACID) 500 MG tablet Take 500 mg by mouth daily.   Yes Historical  Provider, MD  vitamin E 400 UNIT capsule Take 400 Units by mouth daily.   Yes Historical Provider, MD  benzonatate (TESSALON) 100 MG capsule Take 1 capsule (100 mg total) by mouth 3 (three) times daily as needed. Patient not taking: Reported on 10/05/2015 08/15/15   Jan Fireman, PA-C   BP 131/81 mmHg  Pulse 85  Temp(Src) 98.1 F (36.7 C) (Oral)  Resp 18  SpO2 96% Physical Exam  Constitutional: He is oriented to person, place, and time. He appears well-developed and well-nourished.  HENT:  Head: Normocephalic and atraumatic.  Mild pallor   Eyes: EOM are normal. Pupils are equal, round, and reactive to light.  Neck: Normal range of motion. Neck supple. No JVD present.  Cardiovascular: Normal rate and regular rhythm.  Exam reveals no gallop and no friction rub.   No murmur heard. Pulmonary/Chest: No respiratory distress. He has no wheezes. He has no  rales.  Abdominal: He exhibits no distension. There is no tenderness. There is no rebound and no guarding.  Musculoskeletal: Normal range of motion.  Neurological: He is alert and oriented to person, place, and time.  Skin: No rash noted. No pallor.  Psychiatric: He has a normal mood and affect. His behavior is normal.  Nursing note and vitals reviewed.   ED Course  Procedures (including critical care time) Labs Review Labs Reviewed  CBC WITH DIFFERENTIAL/PLATELET - Abnormal; Notable for the following:    RBC 3.07 (*)    Hemoglobin 9.5 (*)    HCT 27.8 (*)    RDW 16.1 (*)    Lymphs Abs 0.6 (*)    All other components within normal limits  BASIC METABOLIC PANEL - Abnormal; Notable for the following:    Potassium 2.9 (*)    Glucose, Bld 109 (*)    Calcium 8.5 (*)    All other components within normal limits    Imaging Review No results found. I have personally reviewed and evaluated these images and lab results as part of my medical decision-making.   EKG Interpretation   Date/Time:  Tuesday October 24 2015 09:49:57  EST Ventricular Rate:  78 PR Interval:  195 QRS Duration: 82 QT Interval:  398 QTC Calculation: 453 R Axis:   51 Text Interpretation:  Sinus rhythm Atrial premature complexes no prolonged  qt, wpw, brugada No significant change since last tracing Confirmed by  Dorlene Footman MD, DANIEL ([id number]) on 10/24/2015 11:09:02 AM      MDM   Final diagnoses:  Syncope and collapse  Hypokalemia    70 yo M with a chief complaint of a syncopal event. Likely vasovagal based on history. EKG is unremarkable. Patient with a significant blood loss after her recent surgery will recheck hemoglobin electrolytes with a short bowel. Give IV fluids oral trial.  The patient's hemoglobin is stable. Potassium was mildly diminished at 2.9. We'll replete here. Patient is feeling much better on reassessment. Still has been unable to urinate. Offered to place a Foley here patient however requesting to try and continue to urinate until this afternoon. These unable then he will follow-up with urology.   I have discussed the diagnosis/risks/treatment options with the patient and family and believe the pt to be eligible for discharge home to follow-up with PCP, urology. We also discussed returning to the ED immediately if new or worsening sx occur. We discussed the sx which are most concerning (e.g., sudden chest pain, sob, headache, fever, inability to tolerate by mouth) that necessitate immediate return. Medications administered to the patient during their visit and any new prescriptions provided to the patient are listed below.  Medications given during this visit Medications  sodium chloride 0.9 % bolus 1,000 mL (0 mLs Intravenous Stopped 10/24/15 1122)  potassium chloride SA (K-DUR,KLOR-CON) CR tablet 40 mEq (40 mEq Oral Given 10/24/15 1133)  potassium chloride 10 mEq in 100 mL IVPB (0 mEq Intravenous Stopped 10/24/15 1247)    Discharge Medication List as of 10/24/2015 11:21 AM      The patient appears reasonably screen and/or  stabilized for discharge and I doubt any other medical condition or other [facility] requiring further screening, evaluation, or treatment in the ED at this time prior to discharge.    Deno Etienne, DO 10/24/15 1521

## 2015-10-24 NOTE — Discharge Instructions (Signed)

## 2015-10-24 NOTE — ED Notes (Signed)
Pt was at [facility] urology to have foley cath removed that had been in for 5 days. While standing and attempting to void he had a near syncopal episode and was lowered to the floor.  Pt is alert and oriented, no CP.  VSS for ems

## 2015-10-24 NOTE — ED Notes (Signed)
Bed: IW58 Expected date: 10/24/15 Expected time:  Means of arrival:  Comments: Male from Urology

## 2015-10-24 NOTE — ED Notes (Signed)
EKG was WNL for ems.

## 2015-10-27 LAB — POCT I-STAT 4, (NA,K, GLUC, HGB,HCT)
Glucose, Bld: 110 mg/dL — ABNORMAL HIGH (ref 65–99)
HCT: 32 % — ABNORMAL LOW (ref 39.0–52.0)
Hemoglobin: 10.9 g/dL — ABNORMAL LOW (ref 13.0–17.0)
POTASSIUM: 3.7 mmol/L (ref 3.5–5.1)
SODIUM: 137 mmol/L (ref 135–145)

## 2015-11-30 ENCOUNTER — Other Ambulatory Visit: Payer: Self-pay | Admitting: Neurosurgery

## 2015-11-30 ENCOUNTER — Ambulatory Visit
Admission: RE | Admit: 2015-11-30 | Discharge: 2015-11-30 | Disposition: A | Discharge status: Home / Self Care | Payer: Medicare Other | Source: Ambulatory Visit | Attending: Neurosurgery | Admitting: Neurosurgery

## 2015-11-30 DIAGNOSIS — M5126 Other intervertebral disc displacement, lumbar region: Secondary | ICD-10-CM

## 2015-11-30 MED ORDER — METHYLPREDNISOLONE ACETATE 40 MG/ML INJ SUSP (RADIOLOG
120.0000 mg | Freq: Once | INTRAMUSCULAR | Status: AC
  Administered 2015-11-30: 120 mg via EPIDURAL

## 2015-11-30 MED ORDER — IOHEXOL 180 MG/ML  SOLN
1.0000 mL | Freq: Once | INTRAMUSCULAR | Status: AC | PRN
  Administered 2015-11-30: 1 mL via EPIDURAL

## 2015-11-30 NOTE — Discharge Instructions (Signed)

## 2016-01-24 ENCOUNTER — Ambulatory Visit (HOSPITAL_COMMUNITY)
Admission: RE | Admit: 2016-01-24 | Discharge: 2016-01-24 | Disposition: A | Discharge status: Home / Self Care | Payer: Medicare Other | Source: Ambulatory Visit | Attending: Internal Medicine | Admitting: Internal Medicine

## 2016-01-24 ENCOUNTER — Telehealth: Payer: Self-pay | Admitting: *Deleted

## 2016-01-24 ENCOUNTER — Other Ambulatory Visit (HOSPITAL_BASED_OUTPATIENT_CLINIC_OR_DEPARTMENT_OTHER): Payer: Medicare Other

## 2016-01-24 ENCOUNTER — Ambulatory Visit (HOSPITAL_COMMUNITY): Admission: RE | Admit: 2016-01-24 | Payer: Medicare Other | Source: Ambulatory Visit

## 2016-01-24 DIAGNOSIS — Z85118 Personal history of other malignant neoplasm of bronchus and lung: Secondary | ICD-10-CM | POA: Diagnosis not present

## 2016-01-24 DIAGNOSIS — C342 Malignant neoplasm of middle lobe, bronchus or lung: Secondary | ICD-10-CM | POA: Insufficient documentation

## 2016-01-24 DIAGNOSIS — Z923 Personal history of irradiation: Secondary | ICD-10-CM | POA: Diagnosis not present

## 2016-01-24 DIAGNOSIS — C189 Malignant neoplasm of colon, unspecified: Secondary | ICD-10-CM

## 2016-01-24 DIAGNOSIS — Z9889 Other specified postprocedural states: Secondary | ICD-10-CM | POA: Diagnosis not present

## 2016-01-24 LAB — CBC WITH DIFFERENTIAL/PLATELET
BASO%: 0.1 % (ref 0.0–2.0)
BASOS ABS: 0 10*3/uL (ref 0.0–0.1)
EOS ABS: 0 10*3/uL (ref 0.0–0.5)
EOS%: 0.3 % (ref 0.0–7.0)
HCT: 35 % — ABNORMAL LOW (ref 38.4–49.9)
HEMOGLOBIN: 11.7 g/dL — AB (ref 13.0–17.1)
LYMPH%: 10.1 % — AB (ref 14.0–49.0)
MCH: 27.7 pg (ref 27.2–33.4)
MCHC: 33.4 g/dL (ref 32.0–36.0)
MCV: 82.9 fL (ref 79.3–98.0)
MONO#: 0.5 10*3/uL (ref 0.1–0.9)
MONO%: 7.3 % (ref 0.0–14.0)
NEUT#: 6 10*3/uL (ref 1.5–6.5)
NEUT%: 82.2 % — AB (ref 39.0–75.0)
Platelets: 234 10*3/uL (ref 140–400)
RBC: 4.22 10*6/uL (ref 4.20–5.82)
RDW: 14.9 % — ABNORMAL HIGH (ref 11.0–14.6)
WBC: 7.4 10*3/uL (ref 4.0–10.3)
lymph#: 0.7 10*3/uL — ABNORMAL LOW (ref 0.9–3.3)

## 2016-01-24 LAB — COMPREHENSIVE METABOLIC PANEL
ALT: 11 U/L (ref 0–55)
AST: 14 U/L (ref 5–34)
Albumin: 3.2 g/dL — ABNORMAL LOW (ref 3.5–5.0)
Alkaline Phosphatase: 78 U/L (ref 40–150)
Anion Gap: 9 mEq/L (ref 3–11)
BUN: 8.2 mg/dL (ref 7.0–26.0)
CALCIUM: 9.3 mg/dL (ref 8.4–10.4)
CHLORIDE: 97 meq/L — AB (ref 98–109)
CO2: 25 meq/L (ref 22–29)
CREATININE: 0.8 mg/dL (ref 0.7–1.3)
EGFR: 90 mL/min/{1.73_m2} — ABNORMAL LOW (ref >90)
Glucose: 136 mg/dl (ref 70–140)
Potassium: 4 mEq/L (ref 3.5–5.1)
Sodium: 131 mEq/L — ABNORMAL LOW (ref 136–145)
TOTAL PROTEIN: 7 g/dL (ref 6.4–8.3)
Total Bilirubin: 0.55 mg/dL (ref 0.20–1.20)

## 2016-01-24 NOTE — Telephone Encounter (Signed)
Patient in radiology for CXR.  Tech called for new order with today's date for insurance purposes.  New order entered.

## 2016-01-25 LAB — CEA: CEA1: 11.3 ng/mL — AB (ref 0.0–4.7)

## 2016-01-25 LAB — CEA (PARALLEL TESTING): CEA: 7.2 ng/mL — AB

## 2016-01-31 ENCOUNTER — Ambulatory Visit (HOSPITAL_BASED_OUTPATIENT_CLINIC_OR_DEPARTMENT_OTHER): Payer: Medicare Other | Admitting: Internal Medicine

## 2016-01-31 ENCOUNTER — Telehealth: Payer: Self-pay | Admitting: Internal Medicine

## 2016-01-31 ENCOUNTER — Encounter: Payer: Self-pay | Admitting: Internal Medicine

## 2016-01-31 VITALS — BP 137/85 | HR 107 | Temp 97.6°F | Resp 18 | Ht 69.0 in | Wt 177.1 lb

## 2016-01-31 DIAGNOSIS — Z85038 Personal history of other malignant neoplasm of large intestine: Secondary | ICD-10-CM

## 2016-01-31 DIAGNOSIS — C342 Malignant neoplasm of middle lobe, bronchus or lung: Secondary | ICD-10-CM

## 2016-01-31 DIAGNOSIS — Z85118 Personal history of other malignant neoplasm of bronchus and lung: Secondary | ICD-10-CM | POA: Diagnosis not present

## 2016-01-31 DIAGNOSIS — D509 Iron deficiency anemia, unspecified: Secondary | ICD-10-CM

## 2016-01-31 DIAGNOSIS — R970 Elevated carcinoembryonic antigen [CEA]: Secondary | ICD-10-CM | POA: Diagnosis not present

## 2016-01-31 DIAGNOSIS — C182 Malignant neoplasm of ascending colon: Secondary | ICD-10-CM

## 2016-01-31 NOTE — Telephone Encounter (Signed)
Gave pt apt & avs °

## 2016-01-31 NOTE — Progress Notes (Signed)
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 (Pathologic T3, N0, M0). 1.5 cm primary. 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 4.2 cm cavitary right middle lobe lesion with associated right hilar adenopathy which was PET avid.   PRIOR THERAPY: 1) status post right hemicolectomy 06/09/2002. (Pathologic T3, N0, M0). 1.5 cm primary. No vascular or lymphatic invasion followed by  adjuvant chemotherapy with 6 cycles of 5-fluorouracil plus leucovorin. 2) status post right middle lobe resection by Dr. Arlyce Dice on 08/20/2007 following neoadjuvant chemoradiation. He received weekly carboplatin and Taxol during radiation. Radiation given between August 4 and 06/08/2007. 36 gray to the primary tumor. Boost to 45 gray. 2 additional soft tissue deposits in the right middle lobe satellite lesions versus second primary also treated with a final total tumor dose of 65 gray.  CURRENT THERAPY: Observation.  INTERVAL HISTORY: [name] [name] 70 y.o. male returns to the clinic today for annual followup visit accompanied by his wife. He is feeling fine today with no specific complaints. He has a recent TURP. There was drop in his hemoglobin and hematocrit at that time which is much better today. He denied having any significant chest pain, cough or hemoptysis. He has more shortness breath with exertion just days. He denied having any significant weight loss or night sweats. The patient denied having any rectal bleeding. He had repeat CBC, comprehensive metabolic panel,  CEA as well as chest x-ray performed recently and he is here for evaluation and discussion of his lab and imaging results.  MEDICAL HISTORY: Past Medical History  Diagnosis Date  . Cancer ([place])     lung/colon ca  . Colon cancer ([place]) 01/24/2013    T3N0M0  right colon  R hemicolectomy   . Cancer of middle lobe of lung ([place]) 01/24/2013    4.5 cm RML w associated R hilar adenopathy S/P chemo/RT then Surgery 8/08  . BPH (benign prostatic hyperplasia) 01/24/2013  . HTN (hypertension), benign 01/24/2013  . GERD (gastroesophageal reflux disease)   . Raynauds phenomenon   . Rheumatoid arthritis(714.0) 01/24/2013    Since age 24    ALLERGIES:  is allergic to aspirin; diphenhydramine hcl; and remicade.  MEDICATIONS:  Current Outpatient Prescriptions  Medication Sig Dispense Refill  . Abatacept (ORENCIA IV) Inject into the vein every 30 (thirty) days.    Marland Kitchen amLODipine (NORVASC) 2.5 MG tablet Take 2.5 mg by mouth daily with lunch.     . Calcium Carbonate-Vitamin D (CALCIUM + D PO) Take 600 mg by mouth daily.    Marland Kitchen HYDROcodone-acetaminophen (NORCO/VICODIN) 5-325 MG tablet TK 1 T PO Q 6 H PRN P  0  . leflunomide (ARAVA) 10 MG tablet Take 10 mg by mouth daily.     Marland Kitchen omeprazole (PRILOSEC) 20 MG capsule Take 20 mg by mouth daily.     . predniSONE (DELTASONE) 5 MG tablet Take 5 mg by mouth daily with breakfast.    . vitamin C (ASCORBIC ACID) 500 MG tablet Take 500 mg by mouth daily.    . vitamin E 400 UNIT capsule Take 400 Units by mouth daily.  No current facility-administered medications for this visit.    SURGICAL HISTORY:  Past Surgical History  Procedure Laterality Date  . Gastrectomy  1974    bleeding ulcers  . Lumbar disc surgery  2003  . Lung lobectomy  2007    right  . Tendons ruptured  2010    right-hand  . Colectomy  2004    right-hemi  . Appendectomy    . Inguinal hernia repair Right 06/03/2014    Procedure: HERNIA REPAIR INGUINAL ADULT;  Surgeon: Kaylyn Lim, MD;  Location:  [facility];  Service: General;  Laterality: Right;  . Transurethral resection of prostate N/A 10/19/2015    Procedure: TRANSURETHRAL RESECTION OF THE PROSTATE (TURP);  Surgeon: Carolan Clines, MD;  Location: [facility];  Service: Urology;  Laterality: N/A;  . Cystoscopy N/A 10/19/2015    Procedure: CYSTOSCOPY;  Surgeon: Carolan Clines, MD;  Location: [facility];  Service: Urology;  Laterality: N/A;    REVIEW OF SYSTEMS:  A comprehensive review of systems was negative except for: Constitutional: positive for fatigue Respiratory: positive for dyspnea on exertion   PHYSICAL EXAMINATION: General appearance: alert, cooperative and no distress Head: Normocephalic, without obvious abnormality, atraumatic Neck: no adenopathy, no JVD, supple, symmetrical, trachea midline and thyroid not enlarged, symmetric, no tenderness/mass/nodules Lymph nodes: Cervical, supraclavicular, and axillary nodes normal. Resp: clear to auscultation bilaterally Back: symmetric, no curvature. ROM normal. No CVA tenderness. Cardio: regular rate and rhythm, S1, S2 normal, no murmur, click, rub or gallop GI: soft, non-tender; bowel sounds normal; no masses,  no organomegaly Extremities: extremities normal, atraumatic, no cyanosis or edema Neurologic: Alert and oriented X 3, normal strength and tone. Normal symmetric reflexes. Normal coordination and gait  ECOG PERFORMANCE STATUS: 1 - Symptomatic but completely ambulatory  Blood pressure 137/85, pulse 107, temperature 97.6 F (36.4 C), temperature source Oral, resp. rate 18, height '5\' 9"'$  (1.753 m), weight 177 lb 1.6 oz (80.332 kg), SpO2 99 %.  LABORATORY DATA: Lab Results  Component Value Date   WBC 7.4 01/24/2016   HGB 11.7* 01/24/2016   HCT 35.0* 01/24/2016   MCV 82.9 01/24/2016   PLT 234 01/24/2016      Chemistry      Component Value Date/Time   NA 131* 01/24/2016 1416   NA 136 10/24/2015 1020   NA 138 02/21/2012 1241   K 4.0 01/24/2016 1416   K  2.9* 10/24/2015 1020   K 4.5 02/21/2012 1241   CL 102 10/24/2015 1020   CL 98 01/15/2013 1454   CL 97* 02/21/2012 1241   CO2 25 01/24/2016 1416   CO2 25 10/24/2015 1020   CO2 30 02/21/2012 1241   BUN 8.2 01/24/2016 1416   BUN 7 10/24/2015 1020   BUN 5* 02/21/2012 1241   CREATININE 0.8 01/24/2016 1416   CREATININE 0.84 10/24/2015 1020   CREATININE 1.0 02/21/2012 1241      Component Value Date/Time   CALCIUM 9.3 01/24/2016 1416   CALCIUM 8.5* 10/24/2015 1020   CALCIUM 9.1 02/21/2012 1241   ALKPHOS 78 01/24/2016 1416   ALKPHOS 50 02/20/2011 1308   ALKPHOS 51 08/23/2010 1251   AST 14 01/24/2016 1416   AST 21 02/20/2011 1308   AST 21 08/23/2010 1251   ALT 11 01/24/2016 1416   ALT 20 02/20/2011 1308   ALT 21 08/23/2010 1251   BILITOT 0.55 01/24/2016 1416   BILITOT 1.40 02/20/2011 1308   BILITOT 0.5 08/23/2010 1251       RADIOGRAPHIC STUDIES: No results  found.  ASSESSMENT AND PLAN: This is a very pleasant 70 years old white male with history of stage II colon adenocarcinoma diagnosed in 2003 in addition to stage III non-small cell lung cancer diagnosed in 2008 status post dissection as well as adjuvant therapy. He has been observation since that time with no significant evidence for disease recurrence. His recent blood work showed elevation of CEA up to 11.3. Last year it was 4.1.  I discussed the lab result with the patient today. I recommended for him to have repeat CT scan of the chest, abdomen and pelvis to rule out any disease recurrence of his colon or lung cancer. I will see him back for follow-up visit in 3 weeks for evaluation and discussion of the scan results. If no evidence for disease recurrence, I would see the patient back for follow-up visit in one year. The patient and his wife agreed to the current plan. He was advised to call immediately if he has any concerning symptoms in the interval. The patient voices understanding of current disease status and treatment  options and is in agreement with the current care plan.  All questions were answered. The patient knows to call the clinic with any problems, questions or concerns. We can certainly see the patient much sooner if necessary.  Disclaimer: This note was dictated with voice recognition software. Similar sounding words can inadvertently be transcribed and may not be corrected upon review.

## 2016-02-14 ENCOUNTER — Ambulatory Visit
Admission: RE | Admit: 2016-02-14 | Discharge: 2016-02-14 | Disposition: A | Discharge status: Home / Self Care | Payer: Medicare Other | Source: Ambulatory Visit | Attending: Internal Medicine | Admitting: Internal Medicine

## 2016-02-14 DIAGNOSIS — R918 Other nonspecific abnormal finding of lung field: Secondary | ICD-10-CM | POA: Diagnosis not present

## 2016-02-14 DIAGNOSIS — I2510 Atherosclerotic heart disease of native coronary artery without angina pectoris: Secondary | ICD-10-CM | POA: Diagnosis not present

## 2016-02-14 DIAGNOSIS — D509 Iron deficiency anemia, unspecified: Secondary | ICD-10-CM | POA: Insufficient documentation

## 2016-02-14 DIAGNOSIS — C342 Malignant neoplasm of middle lobe, bronchus or lung: Secondary | ICD-10-CM | POA: Insufficient documentation

## 2016-02-14 DIAGNOSIS — C182 Malignant neoplasm of ascending colon: Secondary | ICD-10-CM | POA: Insufficient documentation

## 2016-02-14 HISTORY — DX: Systemic involvement of connective tissue, unspecified: M35.9

## 2016-02-14 MED ORDER — IOPAMIDOL (ISOVUE-370) INJECTION 76%: 100.0000 mL | Freq: Once | INTRAVENOUS | Status: DC | PRN

## 2016-02-14 MED ORDER — IOHEXOL 350 MG/ML SOLN
100.0000 mL | Freq: Once | INTRAVENOUS | Status: AC | PRN
  Administered 2016-02-14: 100 mL via INTRAVENOUS

## 2016-02-21 ENCOUNTER — Encounter: Payer: Self-pay | Admitting: *Deleted

## 2016-02-21 ENCOUNTER — Encounter: Payer: Self-pay | Admitting: Internal Medicine

## 2016-02-21 ENCOUNTER — Ambulatory Visit (HOSPITAL_BASED_OUTPATIENT_CLINIC_OR_DEPARTMENT_OTHER): Payer: Medicare Other | Admitting: Internal Medicine

## 2016-02-21 ENCOUNTER — Telehealth: Payer: Self-pay | Admitting: Internal Medicine

## 2016-02-21 VITALS — BP 144/88 | HR 107 | Temp 98.2°F | Resp 20 | Ht 69.0 in | Wt 174.5 lb

## 2016-02-21 DIAGNOSIS — R591 Generalized enlarged lymph nodes: Secondary | ICD-10-CM

## 2016-02-21 DIAGNOSIS — Z85038 Personal history of other malignant neoplasm of large intestine: Secondary | ICD-10-CM

## 2016-02-21 DIAGNOSIS — C342 Malignant neoplasm of middle lobe, bronchus or lung: Secondary | ICD-10-CM

## 2016-02-21 DIAGNOSIS — Z85118 Personal history of other malignant neoplasm of bronchus and lung: Secondary | ICD-10-CM | POA: Diagnosis present

## 2016-02-21 DIAGNOSIS — K639 Disease of intestine, unspecified: Secondary | ICD-10-CM

## 2016-02-21 DIAGNOSIS — R918 Other nonspecific abnormal finding of lung field: Secondary | ICD-10-CM | POA: Diagnosis not present

## 2016-02-21 NOTE — Progress Notes (Signed)
Oncology Nurse Navigator Documentation  Oncology Nurse Navigator Flowsheets 02/21/2016  Navigator Location [facility]  Navigator Encounter Type Clinic/[facility]C  Abnormal Finding Date 02/14/2016  Patient Visit Type [facility]  Treatment Phase Pre-Tx/Tx Discussion  Barriers/Navigation Needs Coordination of Care  Interventions Coordination of Care;Education Method  Coordination of Care Appts  Education Method Verbal  Acuity Level 2  Acuity Level 2 Other  Time Spent with Patient 15   Per Dr. Julien Nordmann, I called [facility] to request T-surgery to look at recent CT Chest scan.  Dr. Cyndia Bent looked at scan and stated patient needs bronchoscopy.  Ebony Hail aware and will update me of appt

## 2016-02-21 NOTE — Progress Notes (Signed)
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 (Pathologic T3, N0, M0). 1.5 cm primary. 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 4.2 cm cavitary right middle lobe lesion with associated right hilar adenopathy which was PET avid.   PRIOR THERAPY: 1) status post right hemicolectomy 06/09/2002. (Pathologic T3, N0, M0). 1.5 cm primary. No vascular or lymphatic invasion followed by  adjuvant chemotherapy with 6 cycles of 5-fluorouracil plus leucovorin. 2) status post right middle lobe resection by Dr. Arlyce Dice on 08/20/2007 following neoadjuvant chemoradiation. He received weekly carboplatin and Taxol during radiation. Radiation given between August 4 and 06/08/2007. 36 gray to the primary tumor. Boost to 45 gray. 2 additional soft tissue deposits in the right middle lobe satellite lesions versus second primary also treated with a final total tumor dose of 65 gray.  CURRENT THERAPY: Observation.  INTERVAL HISTORY: [name] [name] 70 y.o. male returns to the clinic today for annual followup visit accompanied by his wife. The patient has been doing well recently except for increasing cough productive of whitish sputum with no hemoptysis He denied having any significant chest pain or shortness of breath except with exertion. He denied having any fever or chills. He has no nausea or vomiting. He denied having any headache or visual changes. He was noticed recently on repeat bloodwork to have elevated CEA. I ordered CT scan of the chest, abdomen and pelvis  performed recently and he is here for evaluation and discussion of his scan results.  MEDICAL HISTORY: Past Medical History  Diagnosis Date  . Cancer ([employer])     lung/colon ca  . Colon cancer ([name]) 01/24/2013    T3N0M0  right colon  R hemicolectomy   . Cancer of middle lobe of lung ([facility]) 01/24/2013    4.5 cm RML w associated R hilar adenopathy S/P chemo/RT then Surgery 8/08  . BPH (benign prostatic hyperplasia) 01/24/2013  . GERD (gastroesophageal reflux disease)   . Raynauds phenomenon   . Rheumatoid arthritis(714.0) 01/24/2013    Since age 41  . Collagen vascular disease ([facility])     RA    ALLERGIES:  is allergic to aspirin; diphenhydramine hcl; and remicade.  MEDICATIONS:  Current Outpatient Prescriptions  Medication Sig Dispense Refill  . Abatacept (ORENCIA IV) Inject into the vein every 30 (thirty) days.    Marland Kitchen amLODipine (NORVASC) 2.5 MG tablet Take 2.5 mg by mouth daily with lunch.     . Calcium Carbonate-Vitamin D (CALCIUM + D PO) Take 600 mg by mouth daily.    Marland Kitchen HYDROcodone-acetaminophen (NORCO/VICODIN) 5-325 MG tablet TK 1 T PO Q 6 H PRN P  0  . leflunomide (ARAVA) 10 MG tablet Take 10 mg by mouth daily.     Marland Kitchen omeprazole (PRILOSEC) 20 MG capsule Take 20 mg by mouth daily.     . predniSONE (DELTASONE) 5 MG tablet Take 5 mg by mouth daily with breakfast.    . vitamin C (ASCORBIC ACID) 500 MG tablet Take 500 mg by mouth daily.    . vitamin E 400 UNIT capsule Take 400 Units by mouth daily.  No current facility-administered medications for this visit.    SURGICAL HISTORY:  Past Surgical History  Procedure Laterality Date  . Gastrectomy  1974    bleeding ulcers  . Lumbar disc surgery  2003  . Lung lobectomy  2007    right  . Tendons ruptured  2010    right-hand  . Colectomy  2004    right-hemi  . Appendectomy    . Inguinal hernia repair Right 06/03/2014    Procedure: HERNIA REPAIR INGUINAL ADULT;  Surgeon: Kaylyn Lim, MD;  Location: [facility];  Service:  General;  Laterality: Right;  . Transurethral resection of prostate N/A 10/19/2015    Procedure: TRANSURETHRAL RESECTION OF THE PROSTATE (TURP);  Surgeon: Carolan Clines, MD;  Location: [facility];  Service: Urology;  Laterality: N/A;  . Cystoscopy N/A 10/19/2015    Procedure: CYSTOSCOPY;  Surgeon: Carolan Clines, MD;  Location: [facility];  Service: Urology;  Laterality: N/A;    REVIEW OF SYSTEMS:  Constitutional: positive for fatigue Eyes: negative Ears, nose, mouth, throat, and face: negative Respiratory: positive for cough, dyspnea on exertion and sputum Cardiovascular: negative Gastrointestinal: negative Genitourinary:negative Integument/breast: negative Hematologic/lymphatic: negative Musculoskeletal:negative Neurological: negative Behavioral/Psych: negative Endocrine: negative Allergic/Immunologic: negative   PHYSICAL EXAMINATION: General appearance: alert, cooperative and no distress Head: Normocephalic, without obvious abnormality, atraumatic Neck: no adenopathy, no JVD, supple, symmetrical, trachea midline and thyroid not enlarged, symmetric, no tenderness/mass/nodules Lymph nodes: Cervical, supraclavicular, and axillary nodes normal. Resp: clear to auscultation bilaterally Back: symmetric, no curvature. ROM normal. No CVA tenderness. Cardio: regular rate and rhythm, S1, S2 normal, no murmur, click, rub or gallop GI: soft, non-tender; bowel sounds normal; no masses,  no organomegaly Extremities: extremities normal, atraumatic, no cyanosis or edema Neurologic: Alert and oriented X 3, normal strength and tone. Normal symmetric reflexes. Normal coordination and gait  ECOG PERFORMANCE STATUS: 1 - Symptomatic but completely ambulatory  Blood pressure 144/88, pulse 107, temperature 98.2 F (36.8 C), temperature source Oral, resp. rate 20, height '5\' 9"'$  (1.753 m), weight 174 lb 8 oz (79.153 kg), SpO2 100 %.  LABORATORY DATA: Lab Results  Component Value Date   WBC 7.4  01/24/2016   HGB 11.7* 01/24/2016   HCT 35.0* 01/24/2016   MCV 82.9 01/24/2016   PLT 234 01/24/2016      Chemistry      Component Value Date/Time   NA 131* 01/24/2016 1416   NA 136 10/24/2015 1020   NA 138 02/21/2012 1241   K 4.0 01/24/2016 1416   K 2.9* 10/24/2015 1020   K 4.5 02/21/2012 1241   CL 102 10/24/2015 1020   CL 98 01/15/2013 1454   CL 97* 02/21/2012 1241   CO2 25 01/24/2016 1416   CO2 25 10/24/2015 1020   CO2 30 02/21/2012 1241   BUN 8.2 01/24/2016 1416   BUN 7 10/24/2015 1020   BUN 5* 02/21/2012 1241   CREATININE 0.8 01/24/2016 1416   CREATININE 0.84 10/24/2015 1020   CREATININE 1.0 02/21/2012 1241      Component Value Date/Time   CALCIUM 9.3 01/24/2016 1416   CALCIUM 8.5* 10/24/2015 1020   CALCIUM 9.1 02/21/2012 1241   ALKPHOS 78 01/24/2016 1416   ALKPHOS 50 02/20/2011 1308   ALKPHOS 51 08/23/2010 1251   AST 14 01/24/2016 1416   AST 21 02/20/2011 1308   AST 21 08/23/2010 1251   ALT 11 01/24/2016 1416   ALT 20 02/20/2011 1308   ALT 21 08/23/2010 1251   BILITOT 0.55 01/24/2016 1416  BILITOT 1.40 02/20/2011 1308   BILITOT 0.5 08/23/2010 1251       RADIOGRAPHIC STUDIES: Dg Chest 1 View  01/24/2016  CLINICAL DATA:  Shortness of breath and chest pain, history of previous right lung lobectomy EXAM: CHEST 1 VIEW COMPARISON:  10/19/2015 FINDINGS: Fullness in the right hilum is again noted consistent with scarring and likely post radiation change. Surgical clips are again seen. The apical nodule on the right is again identified and stable. Left lung remains clear. No sizable effusion is noted. No bony abnormality is seen. IMPRESSION: Status post right lung surgery with external radiation. No acute abnormality is noted. Electronically Signed   By: Inez Catalina M.D.   On: 01/24/2016 15:22   Ct Chest W Contrast  02/14/2016  CLINICAL DATA:  70 year old male with remote history of colon cancer, and diagnosis of lung cancer 7 years ago status post right middle  lobectomy. Cough and chest pain for the past 2-3 months. EXAM: CT CHEST, ABDOMEN, AND PELVIS WITH CONTRAST TECHNIQUE: Multidetector CT imaging of the chest, abdomen and pelvis was performed following the standard protocol during bolus administration of intravenous contrast. CONTRAST:  125m OMNIPAQUE IOHEXOL 350 MG/ML SOLN COMPARISON:  CT the chest, abdomen and pelvis 01/28/2013. FINDINGS: CT CHEST FINDINGS Mediastinum/Lymph Nodes: Heart size is normal. There is no significant pericardial fluid, thickening or pericardial calcification. There is atherosclerosis of the thoracic aorta, the great vessels of the mediastinum and the coronary arteries, including calcified atherosclerotic plaque in the left main, left anterior descending and right coronary arteries. There is no significant pericardial fluid, thickening or pericardial calcification. Enhancing 12 mm low right paratracheal lymph node. No hilar lymphadenopathy. Axial image 31 of series 2 and coronal image 69 of series 602 demonstrate a defect in the inferior wall of the proximal left mainstem bronchus, which appears to communicate to a thick-walled cavitary area in the subcarinal region, which contains a couple of surgical clips. This abnormal soft tissue in this region is suspicious for lymphadenopathy, measuring up to 2.0 cm on axial image 34 of series 2. This is immediately anterior to the esophagus, and whether or not this cavitary area communicates with the lumen of the esophagus is uncertain (not favored). Multiple surgical clips in the right hilar region related to prior right middle lobectomy. No axillary lymphadenopathy. Lungs/Pleura: Status post right middle lobectomy. 5.7 x 6.1 x 7.8 cm macrolobulated mass in the anterior aspect of the left lower lobe (axial image 51 of series 2 and coronal image 70 of series 602). 3.1 x 1.6 x 2.3 cm macrolobulated mass in the apex of the right upper lobe (axial image 36 of series 5 and coronal image 8 of series 602)  has some spiculated margins and demonstrates overlying pleural retraction. Ovoid shaped 1.7 x 1.1 cm nodule in the right upper lobe (image 97 of series 5). Several other smaller sub cm pulmonary nodules are noted elsewhere throughout the lungs bilaterally, at least 2 of which are cavitary in the periphery of the right lower lobe (image 139 of series 5) and periphery of the left lower lobe (image 124 of series 5). Extensive mass-like architectural distortion in the central aspect of the remaining right upper and lower lobes, most compatible with postradiation mass-like fibrosis. Throughout these regions there is cylindrical and varicose bronchiectasis. Mild diffuse bronchial wall thickening with moderate centrilobular and paraseptal emphysema. No acute consolidative airspace disease. Trace right pleural effusion lying dependently. No left pleural effusion. Musculoskeletal/Soft Tissues: There are no aggressive appearing lytic or  blastic lesions noted in the visualized portions of the skeleton. CT ABDOMEN AND PELVIS FINDINGS Hepatobiliary: No cystic or solid hepatic lesions. No intra or extrahepatic biliary ductal dilatation. Gallbladder is normal in appearance. Pancreas: No pancreatic mass. No pancreatic ductal dilatation. No pancreatic or peripancreatic fluid or inflammatory changes. Spleen: Unremarkable. Adrenals/Urinary Tract: 3.7 x 1.6 cm left adrenal mass is only slightly larger than remote prior study from 01/28/2013, favored to reflect hyperplasia. Right adrenal gland is normal in appearance. Bilateral kidneys are normal in appearance. Calcifications in the renal hila bilaterally appear to be vascular. No hydroureteronephrosis. Urinary bladder is normal in appearance. Stomach/Bowel: Surgical clips near the gastroesophageal junction. The appearance of the stomach is normal. 12 mm fatty attenuation lesion in the third portion of the duodenum (image 82 of series 2) only slightly larger than remote prior study  01/28/2013 in retrospect, likely to represent a tiny duodenal lipoma. No pathologic dilatation of small bowel or colon. 1.8 cm fatty attenuation lesion in the distal descending colon (image 85 of series 2) may represent a lipoma or lipomatous polyp. The appendix is not confidently identified may be surgically absent. Regardless, there are no inflammatory changes noted adjacent to the cecum to suggest the presence of an acute appendicitis at this time. Vascular/Lymphatic: Atherosclerosis throughout the abdominal and pelvic vasculature, without evidence of aneurysm or dissection. No lymphadenopathy noted in the abdomen or pelvis. Reproductive: Prostate gland and seminal vesicles are unremarkable in appearance. Other: No significant volume of ascites.  No pneumoperitoneum. Musculoskeletal: There are no aggressive appearing lytic or blastic lesions noted in the visualized portions of the skeleton. IMPRESSION: 1. Multiple new pulmonary nodules and masses, concerning for either metastatic disease to the lungs, or multiple new synchronous primaries (or some combination thereof). Further evaluation with PET-CT should be considered to evaluate these findings and evaluate for additional sites of metastatic disease. 2. Importantly, there appears to be an erosion through the inferior wall of the proximal left mainstem bronchus, with a small amount of gas in the middle mediastinum in the subcarinal region. This mediastinal gas appears to be entrapped within some enhancing soft tissue in this subcarinal region (i.e., there does not appear to be pneumomediastinum elsewhere at this time), and there is no rim enhancing mediastinal fluid collection to suggest abscess at this time. However, this finding likely predisposes the patient to development of mediastinitis. Correlation with bronchoscopy is recommended, for potential covered stent placement if clinically appropriate. 3. Subcarinal and low right paratracheal lymphadenopathy may  be malignant, or could be reactive given the findings in number 2 above. 4. Given the presence of some small cavitary nodules in the lungs bilaterally, coincident infection is not excluded. However, should one of these lesions prove to be a squamous cell carcinoma, small cavitary nodules may represent metastatic lesions. 5. Mass-like thickening of the left adrenal gland relatively similar appearance to the prior examination from 2014, likely represent adrenal hyperplasia. 6. Fact containing lesion in the distal descending colon may represent a colonic lipoma or a lipomatous polyp. Correlation with nonemergent colonoscopy is suggested in the near future. 7. 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 8. Atherosclerosis, including left main and 2 vessel coronary artery disease. 9. Additional incidental findings, as above. These results were called by telephone at the time of interpretation on 02/14/2016 at 4:40 pm to Dr. Curt Bears , who verbally acknowledged these results. Electronically  Signed   By: Vinnie Langton M.D.   On: 02/14/2016 16:40   Ct Abdomen Pelvis W Contrast  02/14/2016  CLINICAL DATA:  70 year old male with remote history of colon cancer, and diagnosis of lung cancer 7 years ago status post right middle lobectomy. Cough and chest pain for the past 2-3 months. EXAM: CT CHEST, ABDOMEN, AND PELVIS WITH CONTRAST TECHNIQUE: Multidetector CT imaging of the chest, abdomen and pelvis was performed following the standard protocol during bolus administration of intravenous contrast. CONTRAST:  168m OMNIPAQUE IOHEXOL 350 MG/ML SOLN COMPARISON:  CT the chest, abdomen and pelvis 01/28/2013. FINDINGS: CT CHEST FINDINGS Mediastinum/Lymph Nodes: Heart size is normal. There is no significant pericardial fluid, thickening or pericardial calcification. There is atherosclerosis of the thoracic  aorta, the great vessels of the mediastinum and the coronary arteries, including calcified atherosclerotic plaque in the left main, left anterior descending and right coronary arteries. There is no significant pericardial fluid, thickening or pericardial calcification. Enhancing 12 mm low right paratracheal lymph node. No hilar lymphadenopathy. Axial image 31 of series 2 and coronal image 69 of series 602 demonstrate a defect in the inferior wall of the proximal left mainstem bronchus, which appears to communicate to a thick-walled cavitary area in the subcarinal region, which contains a couple of surgical clips. This abnormal soft tissue in this region is suspicious for lymphadenopathy, measuring up to 2.0 cm on axial image 34 of series 2. This is immediately anterior to the esophagus, and whether or not this cavitary area communicates with the lumen of the esophagus is uncertain (not favored). Multiple surgical clips in the right hilar region related to prior right middle lobectomy. No axillary lymphadenopathy. Lungs/Pleura: Status post right middle lobectomy. 5.7 x 6.1 x 7.8 cm macrolobulated mass in the anterior aspect of the left lower lobe (axial image 51 of series 2 and coronal image 70 of series 602). 3.1 x 1.6 x 2.3 cm macrolobulated mass in the apex of the right upper lobe (axial image 36 of series 5 and coronal image 8 of series 602) has some spiculated margins and demonstrates overlying pleural retraction. Ovoid shaped 1.7 x 1.1 cm nodule in the right upper lobe (image 97 of series 5). Several other smaller sub cm pulmonary nodules are noted elsewhere throughout the lungs bilaterally, at least 2 of which are cavitary in the periphery of the right lower lobe (image 139 of series 5) and periphery of the left lower lobe (image 124 of series 5). Extensive mass-like architectural distortion in the central aspect of the remaining right upper and lower lobes, most compatible with postradiation mass-like  fibrosis. Throughout these regions there is cylindrical and varicose bronchiectasis. Mild diffuse bronchial wall thickening with moderate centrilobular and paraseptal emphysema. No acute consolidative airspace disease. Trace right pleural effusion lying dependently. No left pleural effusion. Musculoskeletal/Soft Tissues: There are no aggressive appearing lytic or blastic lesions noted in the visualized portions of the skeleton. CT ABDOMEN AND PELVIS FINDINGS Hepatobiliary: No cystic or solid hepatic lesions. No intra or extrahepatic biliary ductal dilatation. Gallbladder is normal in appearance. Pancreas: No pancreatic mass. No pancreatic ductal dilatation. No pancreatic or peripancreatic fluid or inflammatory changes. Spleen: Unremarkable. Adrenals/Urinary Tract: 3.7 x 1.6 cm left adrenal mass is only slightly larger than remote prior study from 01/28/2013, favored to reflect hyperplasia. Right adrenal gland is normal in appearance. Bilateral kidneys are normal in appearance. Calcifications in the renal hila bilaterally appear to be vascular. No hydroureteronephrosis. Urinary bladder is normal in appearance. Stomach/Bowel: Surgical  clips near the gastroesophageal junction. The appearance of the stomach is normal. 12 mm fatty attenuation lesion in the third portion of the duodenum (image 82 of series 2) only slightly larger than remote prior study 01/28/2013 in retrospect, likely to represent a tiny duodenal lipoma. No pathologic dilatation of small bowel or colon. 1.8 cm fatty attenuation lesion in the distal descending colon (image 85 of series 2) may represent a lipoma or lipomatous polyp. The appendix is not confidently identified may be surgically absent. Regardless, there are no inflammatory changes noted adjacent to the cecum to suggest the presence of an acute appendicitis at this time. Vascular/Lymphatic: Atherosclerosis throughout the abdominal and pelvic vasculature, without evidence of aneurysm or  dissection. No lymphadenopathy noted in the abdomen or pelvis. Reproductive: Prostate gland and seminal vesicles are unremarkable in appearance. Other: No significant volume of ascites.  No pneumoperitoneum. Musculoskeletal: There are no aggressive appearing lytic or blastic lesions noted in the visualized portions of the skeleton. IMPRESSION: 1. Multiple new pulmonary nodules and masses, concerning for either metastatic disease to the lungs, or multiple new synchronous primaries (or some combination thereof). Further evaluation with PET-CT should be considered to evaluate these findings and evaluate for additional sites of metastatic disease. 2. Importantly, there appears to be an erosion through the inferior wall of the proximal left mainstem bronchus, with a small amount of gas in the middle mediastinum in the subcarinal region. This mediastinal gas appears to be entrapped within some enhancing soft tissue in this subcarinal region (i.e., there does not appear to be pneumomediastinum elsewhere at this time), and there is no rim enhancing mediastinal fluid collection to suggest abscess at this time. However, this finding likely predisposes the patient to development of mediastinitis. Correlation with bronchoscopy is recommended, for potential covered stent placement if clinically appropriate. 3. Subcarinal and low right paratracheal lymphadenopathy may be malignant, or could be reactive given the findings in number 2 above. 4. Given the presence of some small cavitary nodules in the lungs bilaterally, coincident infection is not excluded. However, should one of these lesions prove to be a squamous cell carcinoma, small cavitary nodules may represent metastatic lesions. 5. Mass-like thickening of the left adrenal gland relatively similar appearance to the prior examination from 2014, likely represent adrenal hyperplasia. 6. Fact containing lesion in the distal descending colon may represent a colonic lipoma or a  lipomatous polyp. Correlation with nonemergent colonoscopy is suggested in the near future. 7. 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 8. Atherosclerosis, including left main and 2 vessel coronary artery disease. 9. Additional incidental findings, as above. These results were called by telephone at the time of interpretation on 02/14/2016 at 4:40 pm to Dr. Curt Bears , who verbally acknowledged these results. Electronically Signed   By: Vinnie Langton M.D.   On: 02/14/2016 16:40   ASSESSMENT AND PLAN: This is a very pleasant 70 years old white male with history of stage II colon adenocarcinoma diagnosed in 2003 in addition to stage III non-small cell lung cancer diagnosed in 2008 status post dissection as well as adjuvant therapy. He has been observation since that time with no significant evidence for disease recurrence. His recent blood work showed elevation of CEA up to 11.3. Last year it was 4.1.  I ordered repeat CT scan of the chest, abdomen and pelvis which was performed a few days ago and  unfortunately it showed multiple new pulmonary nodules and masses concerning for either metastatic disease to the lung or multiple new synchronous primaries. There was also what appears to be an erosion through the inferior wall of the proximal left mainstem bronchus with a small amount of gas in the middle mediastinum and the subcarinal region. There was also evidence of subcarinal and lower right paratracheal lymphadenopathy. The scan also showed fat containing lesion in the distal descending colon questionable for colonic lipoma or lipomatous polyp. I discussed the scan results and showed the images to the patient and his wife. I will refer the patient to cardiothoracic surgery for evaluation and consideration of repeat bronchoscopy and evaluation of the mediastinal as well as  consideration of tissue biopsy. I will also order a PET scan and MRI of the brain to complete the staging workup of his disease. I will see him back for follow-up visit in 2 weeks for evaluation and discussion of the scan results and treatment options based on the final pathology and imaging studies. The patient and his wife agreed to the current plan. He was advised to call immediately if he has any concerning symptoms in the interval. The patient voices understanding of current disease status and treatment options and is in agreement with the current care plan.  All questions were answered. The patient knows to call the clinic with any problems, questions or concerns. We can certainly see the patient much sooner if necessary.  Disclaimer: This note was dictated with voice recognition software. Similar sounding words can inadvertently be transcribed and may not be corrected upon review.

## 2016-02-21 NOTE — Telephone Encounter (Signed)
per pof to sch pt appt-gave pt copy of avs-adv [facility] will call to sch scan-

## 2016-02-22 ENCOUNTER — Telehealth: Payer: Self-pay

## 2016-02-22 ENCOUNTER — Telehealth: Payer: Self-pay | Admitting: *Deleted

## 2016-02-22 NOTE — Telephone Encounter (Signed)
Oncology Nurse Navigator Documentation  Oncology Nurse Navigator Flowsheets 02/22/2016  Navigator Encounter Type Telephone  Telephone Outgoing Call  Treatment Phase Pre-Tx/Tx Discussion  Barriers/Navigation Needs Education  Education Other  Interventions Education Method  Education Method Verbal  Acuity Level 1  Time Spent with Patient 69   Spoke with patient's wife regarding patient's schedule/appt's.  I educated on next steps.

## 2016-02-22 NOTE — Telephone Encounter (Signed)
Wife called with question about appt with Dr Roxan Hockey and when the bronchoscopy will be done. Clarified with Jim Like and called wife back. The 1st appt is to meet the doctor and evaluate the pt. Then the doctor will schedule the bronchoscopy. The bronchoscopy could be scheduled in the next day or 2 after initial appt. Wife thanked [place] for clarification.

## 2016-02-26 ENCOUNTER — Other Ambulatory Visit: Payer: Self-pay | Admitting: Neurosurgery

## 2016-02-26 DIAGNOSIS — M5126 Other intervertebral disc displacement, lumbar region: Secondary | ICD-10-CM

## 2016-02-27 ENCOUNTER — Ambulatory Visit
Admission: RE | Admit: 2016-02-27 | Discharge: 2016-02-27 | Disposition: A | Discharge status: Home / Self Care | Payer: Medicare Other | Source: Ambulatory Visit | Attending: Neurosurgery | Admitting: Neurosurgery

## 2016-02-27 ENCOUNTER — Other Ambulatory Visit: Payer: Self-pay | Admitting: Neurosurgery

## 2016-02-27 DIAGNOSIS — M5126 Other intervertebral disc displacement, lumbar region: Secondary | ICD-10-CM

## 2016-02-27 MED ORDER — IOPAMIDOL (ISOVUE-M 200) INJECTION 41%
1.0000 mL | Freq: Once | INTRAMUSCULAR | Status: AC
  Administered 2016-02-27: 1 mL via EPIDURAL

## 2016-02-27 MED ORDER — METHYLPREDNISOLONE ACETATE 40 MG/ML INJ SUSP (RADIOLOG
120.0000 mg | Freq: Once | INTRAMUSCULAR | Status: AC
  Administered 2016-02-27: 120 mg via EPIDURAL

## 2016-02-27 NOTE — Discharge Instructions (Signed)

## 2016-02-28 ENCOUNTER — Institutional Professional Consult (permissible substitution) (INDEPENDENT_AMBULATORY_CARE_PROVIDER_SITE_OTHER): Payer: Medicare Other | Admitting: Cardiothoracic Surgery

## 2016-02-28 ENCOUNTER — Other Ambulatory Visit: Payer: Self-pay | Admitting: *Deleted

## 2016-02-28 ENCOUNTER — Other Ambulatory Visit: Payer: Self-pay

## 2016-02-28 ENCOUNTER — Encounter: Payer: Self-pay | Admitting: Cardiothoracic Surgery

## 2016-02-28 DIAGNOSIS — R918 Other nonspecific abnormal finding of lung field: Secondary | ICD-10-CM | POA: Diagnosis not present

## 2016-02-28 DIAGNOSIS — K222 Esophageal obstruction: Secondary | ICD-10-CM

## 2016-02-28 DIAGNOSIS — R222 Localized swelling, mass and lump, trunk: Secondary | ICD-10-CM

## 2016-02-28 NOTE — Progress Notes (Signed)
[address]       [place] [address]             [phone]                    Erasmus L Dekay [place] Medical Record #[id number] Date of Birth: July 22, 1946  Referring: Curt Bears, MD Primary Care: [name], MD  Chief Complaint:    Chief Complaint  Patient presents with  . 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 (Pathologic T3, N0, M0). 1.5 cm primary. 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 4.2 cm cavitary right middle lobe lesion with associated right hilar adenopathy which was PET avid.   PRIOR THERAPY: 1) status post right hemicolectomy 06/09/2002. (Pathologic T3, N0, M0). 1.5 cm primary. No vascular or lymphatic invasion followed by adjuvant chemotherapy with 6 cycles of 5-fluorouracil plus leucovorin. 2) status post right middle lobe resection by Dr. Arlyce Dice on 08/20/2007 following neoadjuvant chemoradiation. He received weekly carboplatin and Taxol during radiation. Radiation given between August 4 and 06/08/2007. 36 gray to the primary tumor. Boost to 45 gray. 2 additional soft tissue deposits in the right middle lobe satellite lesions versus second primary also treated with a final total tumor dose of 65 gray.  CURRENT THERAPY: Observation.History of Present Illness:    [name] [name] 70 y.o. male is seen in the office  today for abnormal CT of chest. He preop  chemo and radiationfollowed by right midle lobectomy by Dr Arlyce Dice 2008. He notes 5-7 lb wight loss, and general fatigue for past 6 months. He notes "heavy non productive cough". Not related to eating or drinking.       Current Activity/ Functional Status:  Patient is  independent with mobility/ambulation, transfers, ADL's, IADL's.   Zubrod Score: At the time of surgery this patient's most appropriate activity status/level should be described as: '[]'$     0    Normal activity, no symptoms '[]'$     1    Restricted in physical strenuous activity but ambulatory, able to do out light work '[]'$     2    Ambulatory and capable of self care, unable to do work activities, up and about               >50 % of waking hours                              '[]'$     3    Only limited self care, in bed greater than 50% of waking hours '[]'$     4    Completely disabled, no self care, confined to bed or chair '[]'$     5    Moribund   Past Medical History  Diagnosis Date  . Cancer ([facility])     lung/colon ca  . Colon cancer ([place]) 01/24/2013    T3N0M0  right colon  R hemicolectomy   . Cancer of middle lobe of lung ([facility]) 01/24/2013    4.5 cm RML w associated R hilar adenopathy S/P chemo/RT then Surgery 8/08  . BPH (benign prostatic hyperplasia) 01/24/2013  . GERD (gastroesophageal reflux disease)   .  Raynauds phenomenon   . Rheumatoid arthritis(714.0) 01/24/2013    Since age 70  . Collagen vascular disease ([facility])     RA    Past Surgical History  Procedure Laterality Date  . Gastrectomy  1974    bleeding ulcers  . Lumbar disc surgery  2003  . Lung lobectomy  2007    right  . Tendons ruptured  2010    right-hand  . Colectomy  2004    right-hemi  . Appendectomy    . Inguinal hernia repair Right 06/03/2014    Procedure: HERNIA REPAIR INGUINAL ADULT;  Surgeon: Kaylyn Lim, MD;  Location: [facility];  Service: General;  Laterality: Right;  . Transurethral resection of prostate N/A 10/19/2015    Procedure: TRANSURETHRAL RESECTION OF THE PROSTATE (TURP);  Surgeon: Carolan Clines, MD;  Location: [facility];  Service: Urology;  Laterality: N/A;  . Cystoscopy N/A 10/19/2015    Procedure: CYSTOSCOPY;  Surgeon: Carolan Clines, MD;  Location: [facility];  Service: Urology;  Laterality:  N/A;    Family History  Problem Relation Age of Onset  . Colon cancer Neg Hx   . Pancreatic cancer Neg Hx   . Stomach cancer Neg Hx   . Cancer Sister   . Cancer Brother     Social History   Social History  . Marital Status: Married    Spouse Name: N/A  . Number of Children: N/A  . Years of Education: N/A   Occupational History  . Not on file.   Social History Main Topics  . Smoking status: Former Smoker    Quit date: 05/30/2006  . Smokeless tobacco: Never Used  . Alcohol Use: 11.4 oz/week    12 Cans of beer, 7 Shots of liquor per week  . Drug Use: No  . Sexual Activity: Not on file   Other Topics Concern  . Not on file   Social History Narrative    History  Smoking status  . Former Smoker  . Quit date: 05/30/2006  Smokeless tobacco  . Never Used    History  Alcohol Use  . 11.4 oz/week  . 12 Cans of beer, 7 Shots of liquor per week     Allergies  Allergen Reactions  . Aspirin Other (See Comments)    stomach bleeding ulcers  . Diphenhydramine Hcl Other (See Comments)    restless leg,hyperactivity  . Remicade [Infliximab] Hives    Current Outpatient Prescriptions  Medication Sig Dispense Refill  . amLODipine (NORVASC) 2.5 MG tablet Take 2.5 mg by mouth daily with lunch.     . Calcium Carbonate-Vitamin D (CALCIUM + D PO) Take 600 mg by mouth daily.    Marland Kitchen leflunomide (ARAVA) 10 MG tablet Take 10 mg by mouth daily.     Marland Kitchen omeprazole (PRILOSEC) 20 MG capsule Take 20 mg by mouth daily.     . predniSONE (DELTASONE) 5 MG tablet Take 5 mg by mouth daily with breakfast.    . vitamin C (ASCORBIC ACID) 500 MG tablet Take 500 mg by mouth daily.    . vitamin E 400 UNIT capsule Take 400 Units by mouth daily.     No current facility-administered medications for this visit.      Review of Systems:     Cardiac Review of Systems: Y or N  Chest Pain [    ]  Resting SOB [   ] Exertional SOB  [  ]  Orthopnea [  ]   Pedal Edema [   ]  Palpitations [  ] Syncope   [  ]   Presyncope [   ]  General Review of Systems: [Y] = yes [  ]=no Constitional: recent weight change [  ];  Wt loss over the last 3 months [   ] anorexia [  ]; fatigue [  ]; nausea [  ]; night sweats [  ]; fever [  ]; or chills [  ];          Dental: poor dentition[  ]; Last Dentist visit:   Eye : blurred vision [  ]; diplopia [   ]; vision changes [  ];  Amaurosis fugax[  ]; Resp: cough [  ];  wheezing[  ];  hemoptysis[  ]; shortness of breath[  ]; paroxysmal nocturnal dyspnea[  ]; dyspnea on exertion[  ]; or orthopnea[  ];  GI:  gallstones[  ], vomiting[  ];  dysphagia[  ]; melena[  ];  hematochezia [  ]; heartburn[  ];   Hx of  Colonoscopy[  ]; GU: kidney stones [  ]; hematuria[  ];   dysuria [  ];  nocturia[  ];  history of     obstruction [  ]; urinary frequency [  ]             Skin: rash, swelling[  ];, hair loss[  ];  peripheral edema[  ];  or itching[  ]; Musculosketetal: myalgias[  ];  joint swelling[  ];  joint erythema[  ];  joint pain[  ];  back pain[  ];  Heme/Lymph: bruising[  ];  bleeding[  ];  anemia[  ];  Neuro: TIA[  ];  headaches[  ];  stroke[  ];  vertigo[  ];  seizures[  ];   paresthesias[  ];  difficulty walking[  ];  Psych:depression[  ]; anxiety[  ];  Endocrine: diabetes[  ];  thyroid dysfunction[  ];  Immunizations: Flu up to date [  ]; Pneumococcal up to date [  ];  Other:  Physical Exam: BP 144/99 mmHg  Pulse 108  Resp 18  SpO2 98%  PHYSICAL EXAMINATION: General appearance: alert, cooperative, appears older than stated age and no distress Head: Normocephalic, without obvious abnormality, atraumatic Neck: no adenopathy, no carotid bruit, no JVD, supple, symmetrical, trachea midline and thyroid not enlarged, symmetric, no tenderness/mass/nodules Lymph nodes: Cervical, supraclavicular, and axillary nodes normal. Resp: clear to auscultation bilaterally Back: symmetric, no curvature. ROM normal. No CVA tenderness. Cardio: regular rate and rhythm, S1, S2  normal, no murmur, click, rub or gallop GI: soft, non-tender; bowel sounds normal; no masses,  no organomegaly Extremities: extremities normal, atraumatic, no cyanosis or edema Neurologic: Grossly normal  Diagnostic Studies & Laboratory data:     Recent Radiology Findings:    Ct Chest W Contrast Ct Abdomen Pelvis W Contrast  02/14/2016  CLINICAL DATA:  70 year old male with remote history of colon cancer, and diagnosis of lung cancer 7 years ago status post right middle lobectomy. Cough and chest pain for the past 2-3 months. EXAM: CT CHEST, ABDOMEN, AND PELVIS WITH CONTRAST TECHNIQUE: Multidetector CT imaging of the chest, abdomen and pelvis was performed following the standard protocol during bolus administration of intravenous contrast. CONTRAST:  164m OMNIPAQUE IOHEXOL 350 MG/ML SOLN COMPARISON:  CT the chest, abdomen and pelvis 01/28/2013. FINDINGS: CT CHEST FINDINGS Mediastinum/Lymph Nodes: Heart size is normal. There is no significant pericardial fluid, thickening or pericardial calcification. There is atherosclerosis of the thoracic aorta, the great vessels of  the mediastinum and the coronary arteries, including calcified atherosclerotic plaque in the left main, left anterior descending and right coronary arteries. There is no significant pericardial fluid, thickening or pericardial calcification. Enhancing 12 mm low right paratracheal lymph node. No hilar lymphadenopathy. Axial image 31 of series 2 and coronal image 69 of series 602 demonstrate a defect in the inferior wall of the proximal left mainstem bronchus, which appears to communicate to a thick-walled cavitary area in the subcarinal region, which contains a couple of surgical clips. This abnormal soft tissue in this region is suspicious for lymphadenopathy, measuring up to 2.0 cm on axial image 34 of series 2. This is immediately anterior to the esophagus, and whether or not this cavitary area communicates with the lumen of the esophagus  is uncertain (not favored). Multiple surgical clips in the right hilar region related to prior right middle lobectomy. No axillary lymphadenopathy. Lungs/Pleura: Status post right middle lobectomy. 5.7 x 6.1 x 7.8 cm macrolobulated mass in the anterior aspect of the left lower lobe (axial image 51 of series 2 and coronal image 70 of series 602). 3.1 x 1.6 x 2.3 cm macrolobulated mass in the apex of the right upper lobe (axial image 36 of series 5 and coronal image 8 of series 602) has some spiculated margins and demonstrates overlying pleural retraction. Ovoid shaped 1.7 x 1.1 cm nodule in the right upper lobe (image 97 of series 5). Several other smaller sub cm pulmonary nodules are noted elsewhere throughout the lungs bilaterally, at least 2 of which are cavitary in the periphery of the right lower lobe (image 139 of series 5) and periphery of the left lower lobe (image 124 of series 5). Extensive mass-like architectural distortion in the central aspect of the remaining right upper and lower lobes, most compatible with postradiation mass-like fibrosis. Throughout these regions there is cylindrical and varicose bronchiectasis. Mild diffuse bronchial wall thickening with moderate centrilobular and paraseptal emphysema. No acute consolidative airspace disease. Trace right pleural effusion lying dependently. No left pleural effusion. Musculoskeletal/Soft Tissues: There are no aggressive appearing lytic or blastic lesions noted in the visualized portions of the skeleton. CT ABDOMEN AND PELVIS FINDINGS Hepatobiliary: No cystic or solid hepatic lesions. No intra or extrahepatic biliary ductal dilatation. Gallbladder is normal in appearance. Pancreas: No pancreatic mass. No pancreatic ductal dilatation. No pancreatic or peripancreatic fluid or inflammatory changes. Spleen: Unremarkable. Adrenals/Urinary Tract: 3.7 x 1.6 cm left adrenal mass is only slightly larger than remote prior study from 01/28/2013, favored to  reflect hyperplasia. Right adrenal gland is normal in appearance. Bilateral kidneys are normal in appearance. Calcifications in the renal hila bilaterally appear to be vascular. No hydroureteronephrosis. Urinary bladder is normal in appearance. Stomach/Bowel: Surgical clips near the gastroesophageal junction. The appearance of the stomach is normal. 12 mm fatty attenuation lesion in the third portion of the duodenum (image 82 of series 2) only slightly larger than remote prior study 01/28/2013 in retrospect, likely to represent a tiny duodenal lipoma. No pathologic dilatation of small bowel or colon. 1.8 cm fatty attenuation lesion in the distal descending colon (image 85 of series 2) may represent a lipoma or lipomatous polyp. The appendix is not confidently identified may be surgically absent. Regardless, there are no inflammatory changes noted adjacent to the cecum to suggest the presence of an acute appendicitis at this time. Vascular/Lymphatic: Atherosclerosis throughout the abdominal and pelvic vasculature, without evidence of aneurysm or dissection. No lymphadenopathy noted in the abdomen or pelvis. Reproductive: Prostate gland and  seminal vesicles are unremarkable in appearance. Other: No significant volume of ascites.  No pneumoperitoneum. Musculoskeletal: There are no aggressive appearing lytic or blastic lesions noted in the visualized portions of the skeleton. IMPRESSION: 1. Multiple new pulmonary nodules and masses, concerning for either metastatic disease to the lungs, or multiple new synchronous primaries (or some combination thereof). Further evaluation with PET-CT should be considered to evaluate these findings and evaluate for additional sites of metastatic disease. 2. Importantly, there appears to be an erosion through the inferior wall of the proximal left mainstem bronchus, with a small amount of gas in the middle mediastinum in the subcarinal region. This mediastinal gas appears to be entrapped  within some enhancing soft tissue in this subcarinal region (i.e., there does not appear to be pneumomediastinum elsewhere at this time), and there is no rim enhancing mediastinal fluid collection to suggest abscess at this time. However, this finding likely predisposes the patient to development of mediastinitis. Correlation with bronchoscopy is recommended, for potential covered stent placement if clinically appropriate. 3. Subcarinal and low right paratracheal lymphadenopathy may be malignant, or could be reactive given the findings in number 2 above. 4. Given the presence of some small cavitary nodules in the lungs bilaterally, coincident infection is not excluded. However, should one of these lesions prove to be a squamous cell carcinoma, small cavitary nodules may represent metastatic lesions. 5. Mass-like thickening of the left adrenal gland relatively similar appearance to the prior examination from 2014, likely represent adrenal hyperplasia. 6. Fact containing lesion in the distal descending colon may represent a colonic lipoma or a lipomatous polyp. Correlation with nonemergent colonoscopy is suggested in the near future. 7. 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 8. Atherosclerosis, including left main and 2 vessel coronary artery disease. 9. Additional incidental findings, as above. These results were called by telephone at the time of interpretation on 02/14/2016 at 4:40 pm to Dr. Curt Bears , who verbally acknowledged these results. Electronically Signed   By: Vinnie Langton M.D.   On: 02/14/2016 16:40   I have independently reviewed the above radiology studies  and reviewed the findings with the patient.    Recent Lab Findings: Lab Results  Component Value Date   WBC 7.4 01/24/2016   HGB 11.7* 01/24/2016   HCT 35.0* 01/24/2016   PLT 234 01/24/2016   GLUCOSE 136  01/24/2016   ALT 11 01/24/2016   AST 14 01/24/2016   NA 131* 01/24/2016   K 4.0 01/24/2016   CL 102 10/24/2015   CREATININE 0.8 01/24/2016   BUN 8.2 01/24/2016   CO2 25 01/24/2016   INR 1.0 08/18/2007   HGBA1C 5.6 10/19/2015      Assessment / Plan:   1 erosion through the inferior wall of the proximal left mainstem bronchus, with a small amount of gas in the middle mediastinum 2in the subcarinal region- with out acute symptoms 3 enlarging right upper lobe lung mass  4Subcarinal and low right paratracheal lymphadenopathy Suspect recurrent lung ca vs radiation induce fistula of trachea Plan gastrograffin  swallow, pet scan, bronchoscopy and enb to bx right upper lobe lesion    I  spent 40 minutes counseling the patient face to face and 50% or more the  time was spent in counseling and coordination of care. The total time spent in the appointment was 60 minutes.  Grace Isaac MD  [address] [place] [address] Office [phone]   Beeper [phone]  02/28/2016 3:17 PM

## 2016-02-29 ENCOUNTER — Ambulatory Visit
Admission: RE | Admit: 2016-02-29 | Discharge: 2016-02-29 | Disposition: A | Discharge status: Home / Self Care | Payer: Medicare Other | Source: Ambulatory Visit | Attending: Cardiothoracic Surgery | Admitting: Cardiothoracic Surgery

## 2016-02-29 ENCOUNTER — Other Ambulatory Visit: Payer: Self-pay | Admitting: Medical Oncology

## 2016-02-29 ENCOUNTER — Encounter: Payer: Self-pay | Admitting: *Deleted

## 2016-02-29 ENCOUNTER — Telehealth: Payer: Self-pay | Admitting: *Deleted

## 2016-02-29 DIAGNOSIS — R222 Localized swelling, mass and lump, trunk: Secondary | ICD-10-CM

## 2016-02-29 DIAGNOSIS — C3490 Malignant neoplasm of unspecified part of unspecified bronchus or lung: Secondary | ICD-10-CM

## 2016-02-29 DIAGNOSIS — K222 Esophageal obstruction: Secondary | ICD-10-CM

## 2016-02-29 MED ORDER — LORAZEPAM 0.5 MG PO TABS: 0.5000 mg | ORAL_TABLET | Freq: Once | ORAL | Status: DC

## 2016-02-29 NOTE — Telephone Encounter (Signed)
Oncology Nurse Navigator Documentation  Oncology Nurse Navigator Flowsheets 02/29/2016  Navigator Location [facility]  Navigator Encounter Type Telephone  Telephone Outgoing Call  Treatment Phase Pre-Tx/Tx Discussion  Barriers/Navigation Needs Coordination of Care  Interventions Coordination of Care  Coordination of Care Other  Acuity Level 2  Time Spent with Patient 59   Patient's wife called.  She wanted her husband to have something to relax him before scans.  She also wants to see Dr. Julien Nordmann the next day after procedure.  I spoke with Dr. Julien Nordmann.  He ordered Ativan 0.25 mg 30 minutes before scan and 0.25 as needed.  He also stated that he will not have full pathology results for a few days after pathology.  He stated Friday was ok to see patient.  I called patient's wife to update.  She was discourage patient has to wait until Friday.  I explained Dr. Julien Nordmann needs full results before he can give them information on treatment.  I also update her on how patient is to take medication.  I will notify scheduling of appt time.

## 2016-02-29 NOTE — Addendum Note (Signed)
Addended by: Ardeen Garland on: 02/29/2016 03:06 PM   Modules accepted: Orders

## 2016-03-01 ENCOUNTER — Ambulatory Visit (HOSPITAL_COMMUNITY)
Admission: RE | Admit: 2016-03-01 | Discharge: 2016-03-01 | Disposition: A | Discharge status: Home / Self Care | Payer: Medicare Other | Source: Ambulatory Visit | Attending: Internal Medicine | Admitting: Internal Medicine

## 2016-03-01 ENCOUNTER — Encounter (HOSPITAL_COMMUNITY): Payer: Self-pay | Admitting: *Deleted

## 2016-03-01 ENCOUNTER — Other Ambulatory Visit: Payer: Self-pay | Admitting: Internal Medicine

## 2016-03-01 ENCOUNTER — Encounter (HOSPITAL_COMMUNITY)
Admission: RE | Admit: 2016-03-01 | Discharge: 2016-03-01 | Disposition: A | Discharge status: Home / Self Care | Payer: Medicare Other | Source: Ambulatory Visit | Attending: Internal Medicine | Admitting: Internal Medicine

## 2016-03-01 DIAGNOSIS — R918 Other nonspecific abnormal finding of lung field: Secondary | ICD-10-CM | POA: Diagnosis not present

## 2016-03-01 DIAGNOSIS — G939 Disorder of brain, unspecified: Secondary | ICD-10-CM | POA: Insufficient documentation

## 2016-03-01 DIAGNOSIS — C342 Malignant neoplasm of middle lobe, bronchus or lung: Secondary | ICD-10-CM | POA: Insufficient documentation

## 2016-03-01 DIAGNOSIS — R937 Abnormal findings on diagnostic imaging of other parts of musculoskeletal system: Secondary | ICD-10-CM | POA: Diagnosis not present

## 2016-03-01 DIAGNOSIS — R9082 White matter disease, unspecified: Secondary | ICD-10-CM | POA: Diagnosis not present

## 2016-03-01 DIAGNOSIS — R590 Localized enlarged lymph nodes: Secondary | ICD-10-CM | POA: Diagnosis not present

## 2016-03-01 LAB — GLUCOSE, CAPILLARY: GLUCOSE-CAPILLARY: 110 mg/dL — AB (ref 65–99)

## 2016-03-01 MED ORDER — FLUDEOXYGLUCOSE F - 18 (FDG) INJECTION
9.3000 | Freq: Once | INTRAVENOUS | Status: AC | PRN
  Administered 2016-03-01: 9.3 via INTRAVENOUS

## 2016-03-01 MED ORDER — GADOBENATE DIMEGLUMINE 529 MG/ML IV SOLN
20.0000 mL | Freq: Once | INTRAVENOUS | Status: AC | PRN
  Administered 2016-03-01: 16 mL via INTRAVENOUS

## 2016-03-01 NOTE — Progress Notes (Signed)
Pt provided verbal consent for spouse, Velva Harman to complete SDW-pre-op call. Spouse denies pt C/O any acute cardiopulmonary issues. When asked if pt had an echo, stress or cardiac cath, spouse stated, " the only heart studies were the ones that he had after the TURP." Spouse made aware to have pt stop taking vitamins, fish oil, herbal medications and NSAID's. Spouse verbalized understanding of all pre-op instructions. Pt recent admission discussed with Ebony Hail, [place], Anesthesia; PA advised that pt history of adrenal insufficieny be documented in anesthesia complication assessment.

## 2016-03-04 ENCOUNTER — Ambulatory Visit (HOSPITAL_COMMUNITY): Payer: Medicare Other | Admitting: Vascular Surgery

## 2016-03-04 ENCOUNTER — Encounter (HOSPITAL_COMMUNITY)
Admission: RE | Disposition: A | Discharge status: Home / Self Care | Payer: Self-pay | Source: Ambulatory Visit | Attending: Cardiothoracic Surgery

## 2016-03-04 ENCOUNTER — Ambulatory Visit: Payer: Medicare Other | Admitting: Internal Medicine

## 2016-03-04 ENCOUNTER — Encounter (HOSPITAL_COMMUNITY): Payer: Self-pay | Admitting: *Deleted

## 2016-03-04 ENCOUNTER — Ambulatory Visit (HOSPITAL_COMMUNITY): Payer: Medicare Other

## 2016-03-04 ENCOUNTER — Ambulatory Visit (HOSPITAL_COMMUNITY)
Admission: RE | Admit: 2016-03-04 | Discharge: 2016-03-04 | Disposition: A | Discharge status: Home / Self Care | Payer: Medicare Other | Source: Ambulatory Visit | Attending: Cardiothoracic Surgery | Admitting: Cardiothoracic Surgery

## 2016-03-04 DIAGNOSIS — R918 Other nonspecific abnormal finding of lung field: Secondary | ICD-10-CM | POA: Diagnosis present

## 2016-03-04 DIAGNOSIS — Z85038 Personal history of other malignant neoplasm of large intestine: Secondary | ICD-10-CM | POA: Insufficient documentation

## 2016-03-04 DIAGNOSIS — I739 Peripheral vascular disease, unspecified: Secondary | ICD-10-CM | POA: Diagnosis not present

## 2016-03-04 DIAGNOSIS — C3432 Malignant neoplasm of lower lobe, left bronchus or lung: Secondary | ICD-10-CM | POA: Insufficient documentation

## 2016-03-04 DIAGNOSIS — C3431 Malignant neoplasm of lower lobe, right bronchus or lung: Secondary | ICD-10-CM | POA: Diagnosis not present

## 2016-03-04 DIAGNOSIS — Z87891 Personal history of nicotine dependence: Secondary | ICD-10-CM | POA: Diagnosis not present

## 2016-03-04 DIAGNOSIS — K219 Gastro-esophageal reflux disease without esophagitis: Secondary | ICD-10-CM | POA: Insufficient documentation

## 2016-03-04 DIAGNOSIS — I10 Essential (primary) hypertension: Secondary | ICD-10-CM | POA: Diagnosis not present

## 2016-03-04 DIAGNOSIS — C3490 Malignant neoplasm of unspecified part of unspecified bronchus or lung: Secondary | ICD-10-CM

## 2016-03-04 HISTORY — DX: Headache: R51

## 2016-03-04 HISTORY — DX: Unspecified hearing loss, unspecified ear: H91.90

## 2016-03-04 HISTORY — DX: Other nonspecific abnormal finding of lung field: R91.8

## 2016-03-04 HISTORY — DX: Dorsalgia, unspecified: M54.9

## 2016-03-04 HISTORY — DX: Other specified postprocedural states: Z98.890

## 2016-03-04 HISTORY — DX: Other chronic pain: G89.29

## 2016-03-04 HISTORY — DX: Headache, unspecified: R51.9

## 2016-03-04 HISTORY — DX: Unspecified adrenocortical insufficiency: E27.40

## 2016-03-04 HISTORY — DX: Nausea with vomiting, unspecified: R11.2

## 2016-03-04 HISTORY — PX: VIDEO BRONCHOSCOPY WITH ENDOBRONCHIAL NAVIGATION: SHX6175

## 2016-03-04 LAB — COMPREHENSIVE METABOLIC PANEL
ALT: 14 U/L — ABNORMAL LOW (ref 17–63)
AST: 16 U/L (ref 15–41)
Albumin: 3.2 g/dL — ABNORMAL LOW (ref 3.5–5.0)
Alkaline Phosphatase: 76 U/L (ref 38–126)
Anion gap: 10 (ref 5–15)
BUN: 8 mg/dL (ref 6–20)
CO2: 25 mmol/L (ref 22–32)
Calcium: 9.3 mg/dL (ref 8.9–10.3)
Chloride: 103 mmol/L (ref 101–111)
Creatinine, Ser: 0.81 mg/dL (ref 0.61–1.24)
GFR calc Af Amer: >60 mL/min (ref >60)
GFR calc non Af Amer: >60 mL/min (ref >60)
Glucose, Bld: 100 mg/dL — ABNORMAL HIGH (ref 65–99)
Potassium: 3.5 mmol/L (ref 3.5–5.1)
Sodium: 138 mmol/L (ref 135–145)
Total Bilirubin: 1 mg/dL (ref 0.3–1.2)
Total Protein: 6.7 g/dL (ref 6.5–8.1)

## 2016-03-04 LAB — PROTIME-INR
INR: 0.96 (ref 0.00–1.49)
Prothrombin Time: 13 seconds (ref 11.6–15.2)

## 2016-03-04 LAB — CBC
HCT: 37.4 % — ABNORMAL LOW (ref 39.0–52.0)
Hemoglobin: 11.7 g/dL — ABNORMAL LOW (ref 13.0–17.0)
MCH: 26 pg (ref 26.0–34.0)
MCHC: 31.3 g/dL (ref 30.0–36.0)
MCV: 83.1 fL (ref 78.0–100.0)
Platelets: 239 10*3/uL (ref 150–400)
RBC: 4.5 MIL/uL (ref 4.22–5.81)
RDW: 18.1 % — ABNORMAL HIGH (ref 11.5–15.5)
WBC: 8.4 10*3/uL (ref 4.0–10.5)

## 2016-03-04 LAB — APTT: aPTT: 30 seconds (ref 24–37)

## 2016-03-04 SURGERY — VIDEO BRONCHOSCOPY WITH ENDOBRONCHIAL NAVIGATION
Anesthesia: General

## 2016-03-04 MED ORDER — PROPOFOL 10 MG/ML IV BOLUS
INTRAVENOUS | Status: AC
  Filled 2016-03-04: qty 20

## 2016-03-04 MED ORDER — PROPOFOL 10 MG/ML IV BOLUS
INTRAVENOUS | Status: DC | PRN
  Administered 2016-03-04: 20 mg via INTRAVENOUS
  Administered 2016-03-04: 150 mg via INTRAVENOUS
  Administered 2016-03-04: 50 mg via INTRAVENOUS

## 2016-03-04 MED ORDER — FENTANYL CITRATE (PF) 100 MCG/2ML IJ SOLN: 25.0000 ug | INTRAMUSCULAR | Status: DC | PRN

## 2016-03-04 MED ORDER — ONDANSETRON HCL 4 MG/2ML IJ SOLN
INTRAMUSCULAR | Status: DC | PRN
  Administered 2016-03-04: 4 mg via INTRAVENOUS

## 2016-03-04 MED ORDER — LIDOCAINE 2% (20 MG/ML) 5 ML SYRINGE
INTRAMUSCULAR | Status: AC
  Filled 2016-03-04: qty 5

## 2016-03-04 MED ORDER — ROCURONIUM BROMIDE 100 MG/10ML IV SOLN
INTRAVENOUS | Status: DC | PRN
  Administered 2016-03-04: 40 mg via INTRAVENOUS
  Administered 2016-03-04: 10 mg via INTRAVENOUS

## 2016-03-04 MED ORDER — ROCURONIUM BROMIDE 50 MG/5ML IV SOLN
INTRAVENOUS | Status: AC
  Filled 2016-03-04: qty 1

## 2016-03-04 MED ORDER — LIDOCAINE HCL (CARDIAC) 20 MG/ML IV SOLN
INTRAVENOUS | Status: DC | PRN
  Administered 2016-03-04: 100 mg via INTRAVENOUS

## 2016-03-04 MED ORDER — 0.9 % SODIUM CHLORIDE (POUR BTL) OPTIME
TOPICAL | Status: DC | PRN
  Administered 2016-03-04: 1000 mL

## 2016-03-04 MED ORDER — SUGAMMADEX SODIUM 200 MG/2ML IV SOLN
INTRAVENOUS | Status: DC | PRN
  Administered 2016-03-04: 200 mg via INTRAVENOUS

## 2016-03-04 MED ORDER — ONDANSETRON HCL 4 MG/2ML IJ SOLN
INTRAMUSCULAR | Status: AC
  Filled 2016-03-04: qty 2

## 2016-03-04 MED ORDER — FENTANYL CITRATE (PF) 250 MCG/5ML IJ SOLN
INTRAMUSCULAR | Status: AC
  Filled 2016-03-04: qty 5

## 2016-03-04 MED ORDER — EPINEPHRINE HCL 1 MG/ML IJ SOLN
INTRAMUSCULAR | Status: DC | PRN
  Administered 2016-03-04: 1 mg via ENDOTRACHEOPULMONARY

## 2016-03-04 MED ORDER — PROPOFOL 10 MG/ML IV BOLUS
INTRAVENOUS | Status: AC
  Filled 2016-03-04: qty 40

## 2016-03-04 MED ORDER — LACTATED RINGERS IV SOLN
INTRAVENOUS | Status: DC
  Administered 2016-03-04 (×3): via INTRAVENOUS

## 2016-03-04 MED ORDER — SUGAMMADEX SODIUM 200 MG/2ML IV SOLN
INTRAVENOUS | Status: AC
  Filled 2016-03-04: qty 2

## 2016-03-04 MED ORDER — MIDAZOLAM HCL 2 MG/2ML IJ SOLN
INTRAMUSCULAR | Status: AC
  Filled 2016-03-04: qty 2

## 2016-03-04 MED ORDER — FENTANYL CITRATE (PF) 100 MCG/2ML IJ SOLN
INTRAMUSCULAR | Status: DC | PRN
  Administered 2016-03-04 (×5): 50 ug via INTRAVENOUS

## 2016-03-04 MED ORDER — PHENYLEPHRINE HCL 10 MG/ML IJ SOLN
10.0000 mg | INTRAVENOUS | Status: DC | PRN
  Administered 2016-03-04: 20 ug/min via INTRAVENOUS

## 2016-03-04 SURGICAL SUPPLY — 36 items
BRUSH CYTOL CELLEBRITY 1.9X150 (MISCELLANEOUS) ×2 IMPLANT
BRUSH SUPERTRAX BIOPSY (INSTRUMENTS) IMPLANT
BRUSH SUPERTRAX NDL-TIP CYTO (INSTRUMENTS) IMPLANT
CANISTER SUCTION 2500CC (MISCELLANEOUS) ×2 IMPLANT
CHANNEL WORK EXTEND EDGE 180 (KITS) IMPLANT
CHANNEL WORK EXTEND EDGE 45 (KITS) IMPLANT
CHANNEL WORK EXTEND EDGE 90 (KITS) IMPLANT
CONT SPEC 4OZ CLIKSEAL STRL BL (MISCELLANEOUS) ×5 IMPLANT
COVER TABLE BACK 60X90 (DRAPES) ×2 IMPLANT
DRSG AQUACEL AG ADV 3.5X14 (GAUZE/BANDAGES/DRESSINGS) ×1 IMPLANT
FILTER STRAW FLUID ASPIR (MISCELLANEOUS) ×1 IMPLANT
FORCEPS BIOP SUPERTRX PREMAR (INSTRUMENTS) IMPLANT
FORCEPS RADIAL JAW LRG 4 PULM (INSTRUMENTS) IMPLANT
GAUZE SPONGE 4X4 12PLY STRL (GAUZE/BANDAGES/DRESSINGS) ×2 IMPLANT
GLOVE BIO SURGEON STRL SZ 6.5 (GLOVE) ×3 IMPLANT
KIT CLEAN ENDO COMPLIANCE (KITS) ×3 IMPLANT
KIT PROCEDURE EDGE 180 (KITS) IMPLANT
KIT PROCEDURE EDGE 45 (KITS) IMPLANT
KIT PROCEDURE EDGE 90 (KITS) IMPLANT
KIT ROOM TURNOVER OR (KITS) ×2 IMPLANT
MARKER SKIN DUAL TIP RULER LAB (MISCELLANEOUS) ×2 IMPLANT
NDL SUPERTRX PREMARK BIOPSY (NEEDLE) IMPLANT
NEEDLE SUPERTRX PREMARK BIOPSY (NEEDLE) IMPLANT
NS IRRIG 1000ML POUR BTL (IV SOLUTION) ×2 IMPLANT
OIL SILICONE PENTAX (PARTS (SERVICE/REPAIRS)) ×2 IMPLANT
PAD ARMBOARD 7.5X6 YLW CONV (MISCELLANEOUS) ×4 IMPLANT
PATCHES PATIENT (LABEL) ×6 IMPLANT
RADIAL JAW LRG 4 PULMONARY (INSTRUMENTS) ×1
SYR 20CC LL (SYRINGE) IMPLANT
SYR 20ML ECCENTRIC (SYRINGE) ×3 IMPLANT
SYR TB 1ML LUER SLIP (SYRINGE) ×1 IMPLANT
TOWEL OR 17X24 6PK STRL BLUE (TOWEL DISPOSABLE) ×2 IMPLANT
TRAP SPECIMEN MUCOUS 40CC (MISCELLANEOUS) ×2 IMPLANT
TUBE CONNECTING 20X1/4 (TUBING) ×2 IMPLANT
UNDERPAD 30X30 (UNDERPADS AND DIAPERS) ×2 IMPLANT
WATER STERILE IRR 1000ML POUR (IV SOLUTION) ×2 IMPLANT

## 2016-03-04 NOTE — Brief Op Note (Signed)
      [address]       [place] [address]             [phone]      03/04/2016  4:33 PM  PATIENT:  [name] [name]  70 y.o. male  PRE-OPERATIVE DIAGNOSIS:  MEDIASTINAL MASS  POST-OPERATIVE DIAGNOSIS:  Mediastinal mass/ with erosion through the left and right tracheal wall posteriorly / squamous cell cancer    PROCEDURE:  Procedure(s): VIDEO BRONCHOSCOPY WITH BRUSHINGS AND BIOPSY (N/A)  SURGEON:  Surgeon(s) and Role:    * Grace Isaac, MD - Primary    ANESTHESIA:   general  EBL:  Total I/O In: 1300 [I.V.:1300] Out: -   BLOOD ADMINISTERED:none  DRAINS: none   LOCAL MEDICATIONS USED:  NONE  SPECIMEN:  Source of Specimen:  right and left main stem bronchus and left lower lobe bronchus   DISPOSITION OF SPECIMEN:  PATHOLOGY  COUNTS:  YES   DICTATION: .Dragon Dictation  PLAN OF CARE: Discharge to home after PACU  PATIENT DISPOSITION:  PACU - hemodynamically stable.   Delay start of Pharmacological VTE agent (>24hrs) due to surgical blood loss or risk of bleeding: yes

## 2016-03-04 NOTE — Discharge Instructions (Signed)
Flexible Bronchoscopy, Care After Refer to this sheet in the next few weeks. These instructions provide you with information on caring for yourself after your procedure. Your health care provider may also give you more specific instructions. Your treatment has been planned according to current medical practices, but problems sometimes occur. Call your health care provider if you have any problems or questions after your procedure.  WHAT TO EXPECT AFTER THE PROCEDURE It is normal to have the following symptoms for 24-48 hours after the procedure:   Increased cough.  Low-grade fever.  Sore throat or hoarse voice.  Small streaks of blood in your thick spit (sputum) if tissue samples were taken (biopsy). HOME CARE INSTRUCTIONS   Do not eat or drink anything for 2 hours after your procedure. Your nose and throat were numbed by medicine. If you try to eat or drink before the medicine wears off, food or drink could go into your lungs or you could burn yourself. After the numbness is gone and your cough and gag reflexes have returned, you may eat soft food and drink liquids slowly.   The day after the procedure, you can go back to your normal diet.   You may resume normal activities.   Keep all follow-up visits as directed by your health care provider. It is important to keep all your appointments, especially if tissue samples were taken for testing (biopsy). SEEK IMMEDIATE MEDICAL CARE IF:   You have increasing shortness of breath.   You become light-headed or faint.   You have chest pain.   You have any new concerning symptoms.  You cough up more than a small amount of blood.  The amount of blood you cough up increases. MAKE SURE YOU:  Understand these instructions.  Will watch your condition.  Will get help right away if you are not doing well or get worse.   This information is not intended to replace advice given to you by your health care provider. Make sure you discuss  any questions you have with your health care provider.   Document Released: 03/22/2005 Document Revised: 09/23/2014 Document Reviewed: 05/07/2013 Elsevier Interactive Patient Education [employer].

## 2016-03-04 NOTE — Anesthesia Procedure Notes (Signed)
Procedure Name: Intubation Date/Time: 03/04/2016 3:18 PM Performed by: Rejeana Brock L Pre-anesthesia Checklist: Patient identified, Timeout performed, Emergency Drugs available, Suction available and Patient being monitored Patient Re-evaluated:Patient Re-evaluated prior to inductionOxygen Delivery Method: Circle system utilized Preoxygenation: Pre-oxygenation with 100% oxygen Intubation Type: IV induction Ventilation: Mask ventilation without difficulty Laryngoscope Size: Mac and 4 Grade View: Grade I Tube type: Oral Tube size: 8.5 mm Number of attempts: 1 Airway Equipment and Method: Stylet and LTA kit utilized Placement Confirmation: ETT inserted through vocal cords under direct vision,  positive ETCO2 and breath sounds checked- equal and bilateral Secured at: 23 cm Tube secured with: Tape Dental Injury: Teeth and Oropharynx as per pre-operative assessment

## 2016-03-04 NOTE — Brief Op Note (Signed)
      [address]       [place] [address]             [phone]     03/04/2016  4:30 PM  PATIENT:  [name] [name]  70 y.o. 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   Delay start of Pharmacological VTE agent (>24hrs) due to surgical blood loss or risk of bleeding: yes

## 2016-03-04 NOTE — H&P (Signed)
[address]       [place] [address]             [phone]                    Boaz L Aamodt [facility] Medical Record #[id number] Date of Birth: June 09, 1946  Referring: Curt Bears, MD Primary Care: [name], MD  Chief Complaint:    No chief complaint on file.  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 (Pathologic T3, N0, M0). 1.5 cm primary. 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 4.2 cm cavitary right middle lobe lesion with associated right hilar adenopathy which was PET avid.   PRIOR THERAPY: 1) status post right hemicolectomy 06/09/2002. (Pathologic T3, N0, M0). 1.5 cm primary. No vascular or lymphatic invasion followed by adjuvant chemotherapy with 6 cycles of 5-fluorouracil plus leucovorin. 2) status post right middle lobe resection by Dr. Arlyce Dice on 08/20/2007 following neoadjuvant chemoradiation. He received weekly carboplatin and Taxol during radiation. Radiation given between August 4 and 06/08/2007. 36 gray to the primary tumor. Boost to 45 gray. 2 additional soft tissue deposits in the right middle lobe satellite lesions versus second primary also treated with a final total tumor dose of 65 gray.  CURRENT THERAPY: Observation.History of Present Illness:    [name] [name] 70 y.o. male is seen in the office  today for abnormal CT of chest. He preop  chemo and radiationfollowed by right midle lobectomy by Dr Arlyce Dice 2008. He notes 5-7 lb wight loss, and general fatigue for past 6 months. He notes "heavy non productive cough". Not related to eating or drinking.       Current Activity/ Functional Status:  Patient is independent with mobility/ambulation, transfers,  ADL's, IADL's.   Zubrod Score: At the time of surgery this patient's most appropriate activity status/level should be described as: '[]'$     0    Normal activity, no symptoms '[x]'$     1    Restricted in physical strenuous activity but ambulatory, able to do out light work '[]'$     2    Ambulatory and capable of self care, unable to do work activities, up and about               >50 % of waking hours                              '[]'$     3    Only limited self care, in bed greater than 50% of waking hours '[]'$     4    Completely disabled, no self care, confined to bed or chair '[]'$     5    Moribund   Past Medical History  Diagnosis Date  . Cancer ([facility])     lung/colon ca  . Colon cancer ([facility]) 01/24/2013    T3N0M0  right colon  R hemicolectomy   . Cancer of middle lobe of lung ([facility]) 01/24/2013    4.5 cm RML w associated R hilar adenopathy S/P chemo/RT then Surgery 8/08  . BPH (benign prostatic hyperplasia) 01/24/2013  . GERD (gastroesophageal reflux disease)   . Raynauds phenomenon   . Rheumatoid arthritis(714.0) 01/24/2013  Since age 26  . Collagen vascular disease ([facility])     RA  . PONV (postoperative nausea and vomiting)     adrenal insufficiency; needs steriod prior to procedures  . Adrenal insufficiency ([facility])   . Lung mass   . HOH (hard of hearing)   . Headache   . Chronic back pain     Past Surgical History  Procedure Laterality Date  . Gastrectomy  1974    bleeding ulcers  . Lumbar disc surgery  2003  . Lung lobectomy  2007    right  . Tendons ruptured  2010    right-hand  . Colectomy  2004    right-hemi  . Appendectomy    . Inguinal hernia repair Right 06/03/2014    Procedure: HERNIA REPAIR INGUINAL ADULT;  Surgeon: Kaylyn Lim, MD;  Location: [facility];  Service: General;  Laterality: Right;  . Transurethral resection of prostate N/A 10/19/2015    Procedure: TRANSURETHRAL RESECTION OF THE PROSTATE (TURP);  Surgeon: Carolan Clines, MD;  Location: [facility];   Service: Urology;  Laterality: N/A;  . Cystoscopy N/A 10/19/2015    Procedure: CYSTOSCOPY;  Surgeon: Carolan Clines, MD;  Location: [facility];  Service: Urology;  Laterality: N/A;    Family History  Problem Relation Age of Onset  . Colon cancer Neg Hx   . Pancreatic cancer Neg Hx   . Stomach cancer Neg Hx   . Cancer Sister   . Cancer Brother     Social History   Social History  . Marital Status: Married    Spouse Name: N/A  . Number of Children: N/A  . Years of Education: N/A   Occupational History  . Not on file.   Social History Main Topics  . Smoking status: Former Smoker    Quit date: 05/30/2006  . Smokeless tobacco: Never Used  . Alcohol Use: 11.4 oz/week    12 Cans of beer, 7 Shots of liquor per week     Comment: drinks beer 5 days a week  . Drug Use: No  . Sexual Activity: Not on file   Other Topics Concern  . Not on file   Social History Narrative    History  Smoking status  . Former Smoker  . Quit date: 05/30/2006  Smokeless tobacco  . Never Used    History  Alcohol Use  . 11.4 oz/week  . 12 Cans of beer, 7 Shots of liquor per week    Comment: drinks beer 5 days a week     Allergies  Allergen Reactions  . Aspirin Other (See Comments)    stomach bleeding ulcers  . Diphenhydramine Hcl Other (See Comments)    restless leg, hyperactivity  . Remicade [Infliximab] Hives    Current Facility-Administered Medications  Medication Dose Route Frequency Provider Last Rate Last Dose  . lactated ringers infusion   Intravenous Continuous Grace Isaac, MD 10 mL/hr at 03/04/16 1203        Review of Systems:     Cardiac Review of Systems: Y or N  Chest Pain [ n   ]  Resting SOB [n   ] Exertional SOB  [ y ]  Orthopnea [  ]   Pedal Edema [   ]    Palpitations [  ] Syncope  [  ]   Presyncope [   ]  General Review of Systems: [Y] = yes [  ]=no Constitional: recent weight change [  ];  Wt loss over  the last 3 months [   ] anorexia [  ]; fatigue [  ];  nausea [  ]; night sweats [  ]; fever [  ]; or chills [  ];          Dental: poor dentition[  ]; Last Dentist visit:   Eye : blurred vision [  ]; diplopia [   ]; vision changes [  ];  Amaurosis fugax[  ]; Resp: cough Blue.Reese  ];  wheezing[  ];  hemoptysis[  ]; shortness of breath[  ]; paroxysmal nocturnal dyspnea[  ]; dyspnea on exertion[  ]; or orthopnea[  ];  GI:  gallstones[  ], vomiting[  ];  dysphagia[  ]; melena[  ];  hematochezia [  ]; heartburn[  ];   Hx of  Colonoscopy[  ]; GU: kidney stones [  ]; hematuria[  ];   dysuria [  ];  nocturia[  ];  history of     obstruction [  ]; urinary frequency [  ]             Skin: rash, swelling[  ];, hair loss[  ];  peripheral edema[  ];  or itching[  ]; Musculosketetal: myalgias[  ];  joint swelling[  ];  joint erythema[  ];  joint pain[  ];  back pain[  ];  Heme/Lymph: bruising[  ];  bleeding[  ];  anemia[  ];  Neuro: TIA[  ];  headaches[  ];  stroke[  ];  vertigo[  ];  seizures[  ];   paresthesias[  ];  difficulty walking[  ];  Psych:depression[  ]; anxiety[  ];  Endocrine: diabetes[  ];  thyroid dysfunction[  ];  Immunizations: Flu up to date [  ]; Pneumococcal up to date [  ];  Other:  Physical Exam: BP 131/84 mmHg  Pulse 48  Temp(Src) 99 F (37.2 C) (Oral)  Resp 18  Ht '5\' 9"'$  (1.753 m)  Wt 174 lb (78.926 kg)  BMI 25.68 kg/m2  SpO2 96%  PHYSICAL EXAMINATION: General appearance: alert, cooperative, appears older than stated age and no distress Head: Normocephalic, without obvious abnormality, atraumatic Neck: no adenopathy, no carotid bruit, no JVD, supple, symmetrical, trachea midline and thyroid not enlarged, symmetric, no tenderness/mass/nodules Lymph nodes: Cervical, supraclavicular, and axillary nodes normal. Resp: clear to auscultation bilaterally Back: symmetric, no curvature. ROM normal. No CVA tenderness. Cardio: regular rate and rhythm, S1, S2 normal, no murmur, click, rub or gallop GI: soft, non-tender; bowel sounds normal; no  masses,  no organomegaly Extremities: extremities normal, atraumatic, no cyanosis or edema Neurologic: Grossly normal  Diagnostic Studies & Laboratory data:     Recent Radiology Findings:    Ct Chest W Contrast Ct Abdomen Pelvis W Contrast  02/14/2016  CLINICAL DATA:  70 year old male with remote history of colon cancer, and diagnosis of lung cancer 7 years ago status post right middle lobectomy. Cough and chest pain for the past 2-3 months. EXAM: CT CHEST, ABDOMEN, AND PELVIS WITH CONTRAST TECHNIQUE: Multidetector CT imaging of the chest, abdomen and pelvis was performed following the standard protocol during bolus administration of intravenous contrast. CONTRAST:  161m OMNIPAQUE IOHEXOL 350 MG/ML SOLN COMPARISON:  CT the chest, abdomen and pelvis 01/28/2013. FINDINGS: CT CHEST FINDINGS Mediastinum/Lymph Nodes: Heart size is normal. There is no significant pericardial fluid, thickening or pericardial calcification. There is atherosclerosis of the thoracic aorta, the great vessels of the mediastinum and the coronary arteries, including calcified atherosclerotic plaque in the left main, left  anterior descending and right coronary arteries. There is no significant pericardial fluid, thickening or pericardial calcification. Enhancing 12 mm low right paratracheal lymph node. No hilar lymphadenopathy. Axial image 31 of series 2 and coronal image 69 of series 602 demonstrate a defect in the inferior wall of the proximal left mainstem bronchus, which appears to communicate to a thick-walled cavitary area in the subcarinal region, which contains a couple of surgical clips. This abnormal soft tissue in this region is suspicious for lymphadenopathy, measuring up to 2.0 cm on axial image 34 of series 2. This is immediately anterior to the esophagus, and whether or not this cavitary area communicates with the lumen of the esophagus is uncertain (not favored). Multiple surgical clips in the right hilar region related to  prior right middle lobectomy. No axillary lymphadenopathy. Lungs/Pleura: Status post right middle lobectomy. 5.7 x 6.1 x 7.8 cm macrolobulated mass in the anterior aspect of the left lower lobe (axial image 51 of series 2 and coronal image 70 of series 602). 3.1 x 1.6 x 2.3 cm macrolobulated mass in the apex of the right upper lobe (axial image 36 of series 5 and coronal image 8 of series 602) has some spiculated margins and demonstrates overlying pleural retraction. Ovoid shaped 1.7 x 1.1 cm nodule in the right upper lobe (image 97 of series 5). Several other smaller sub cm pulmonary nodules are noted elsewhere throughout the lungs bilaterally, at least 2 of which are cavitary in the periphery of the right lower lobe (image 139 of series 5) and periphery of the left lower lobe (image 124 of series 5). Extensive mass-like architectural distortion in the central aspect of the remaining right upper and lower lobes, most compatible with postradiation mass-like fibrosis. Throughout these regions there is cylindrical and varicose bronchiectasis. Mild diffuse bronchial wall thickening with moderate centrilobular and paraseptal emphysema. No acute consolidative airspace disease. Trace right pleural effusion lying dependently. No left pleural effusion. Musculoskeletal/Soft Tissues: There are no aggressive appearing lytic or blastic lesions noted in the visualized portions of the skeleton. CT ABDOMEN AND PELVIS FINDINGS Hepatobiliary: No cystic or solid hepatic lesions. No intra or extrahepatic biliary ductal dilatation. Gallbladder is normal in appearance. Pancreas: No pancreatic mass. No pancreatic ductal dilatation. No pancreatic or peripancreatic fluid or inflammatory changes. Spleen: Unremarkable. Adrenals/Urinary Tract: 3.7 x 1.6 cm left adrenal mass is only slightly larger than remote prior study from 01/28/2013, favored to reflect hyperplasia. Right adrenal gland is normal in appearance. Bilateral kidneys are normal  in appearance. Calcifications in the renal hila bilaterally appear to be vascular. No hydroureteronephrosis. Urinary bladder is normal in appearance. Stomach/Bowel: Surgical clips near the gastroesophageal junction. The appearance of the stomach is normal. 12 mm fatty attenuation lesion in the third portion of the duodenum (image 82 of series 2) only slightly larger than remote prior study 01/28/2013 in retrospect, likely to represent a tiny duodenal lipoma. No pathologic dilatation of small bowel or colon. 1.8 cm fatty attenuation lesion in the distal descending colon (image 85 of series 2) may represent a lipoma or lipomatous polyp. The appendix is not confidently identified may be surgically absent. Regardless, there are no inflammatory changes noted adjacent to the cecum to suggest the presence of an acute appendicitis at this time. Vascular/Lymphatic: Atherosclerosis throughout the abdominal and pelvic vasculature, without evidence of aneurysm or dissection. No lymphadenopathy noted in the abdomen or pelvis. Reproductive: Prostate gland and seminal vesicles are unremarkable in appearance. Other: No significant volume of ascites.  No pneumoperitoneum.  Musculoskeletal: There are no aggressive appearing lytic or blastic lesions noted in the visualized portions of the skeleton. IMPRESSION: 1. Multiple new pulmonary nodules and masses, concerning for either metastatic disease to the lungs, or multiple new synchronous primaries (or some combination thereof). Further evaluation with PET-CT should be considered to evaluate these findings and evaluate for additional sites of metastatic disease. 2. Importantly, there appears to be an erosion through the inferior wall of the proximal left mainstem bronchus, with a small amount of gas in the middle mediastinum in the subcarinal region. This mediastinal gas appears to be entrapped within some enhancing soft tissue in this subcarinal region (i.e., there does not appear to be  pneumomediastinum elsewhere at this time), and there is no rim enhancing mediastinal fluid collection to suggest abscess at this time. However, this finding likely predisposes the patient to development of mediastinitis. Correlation with bronchoscopy is recommended, for potential covered stent placement if clinically appropriate. 3. Subcarinal and low right paratracheal lymphadenopathy may be malignant, or could be reactive given the findings in number 2 above. 4. Given the presence of some small cavitary nodules in the lungs bilaterally, coincident infection is not excluded. However, should one of these lesions prove to be a squamous cell carcinoma, small cavitary nodules may represent metastatic lesions. 5. Mass-like thickening of the left adrenal gland relatively similar appearance to the prior examination from 2014, likely represent adrenal hyperplasia. 6. Fact containing lesion in the distal descending colon may represent a colonic lipoma or a lipomatous polyp. Correlation with nonemergent colonoscopy is suggested in the near future. 7. 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 8. Atherosclerosis, including left main and 2 vessel coronary artery disease. 9. Additional incidental findings, as above. These results were called by telephone at the time of interpretation on 02/14/2016 at 4:40 pm to Dr. Curt Bears , who verbally acknowledged these results. Electronically Signed   By: Vinnie Langton M.D.   On: 02/14/2016 16:40   Mr Jeri Cos TK Contrast  03/01/2016  CLINICAL DATA:  History of lung and colon cancer. Evaluation for metastatic disease. EXAM: MRI HEAD WITHOUT AND WITH CONTRAST TECHNIQUE: Multiplanar, multiecho pulse sequences of the brain and surrounding structures were obtained without and with intravenous contrast. CONTRAST:  50m MULTIHANCE GADOBENATE DIMEGLUMINE 529 MG/ML IV  SOLN COMPARISON:  Contrast-enhanced head CT 01/20/2009. Brain MRI 04/13/2007. FINDINGS: The study is mildly motion degraded throughout. There is no evidence of acute infarct, intracranial hemorrhage, midline shift, or extra-axial fluid collection. Ventricles and sulci are normal for age. Patchy T2 hyperintensities throughout the cerebral white matter bilaterally have progressed from the prior MRI and are nonspecific but may reflect a combination of chronic small vessel ischemic disease and posttherapy change. Mild T2 signal changes are present in the pons. There is a new chronic lacunar infarct at the lateral margin of the left thalamus. There is a new 4 mm enhancing lesion in the left middle frontal gyrus (series 11, image 47) without significant edema. No other enhancing brain lesions are identified within limitations of motion artifact. No suspicious skull lesions are identified. Orbits are unremarkable. There is a right maxillary sinus mucous retention cyst. Mastoid air cells are clear. Major intracranial vascular flow voids are preserved, with the left vertebral artery again seen to be dominant. IMPRESSION: 1. 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 2. Progressive cerebral white  matter disease. Electronically Signed   By: Logan Bores M.D.   On: 03/01/2016 17:23     Dg Esophagus W/water Sol Cm  02/29/2016  CLINICAL DATA:  Mediastinal mass, evaluate for transesophageal fistula EXAM: ESOPHOGRAM/BARIUM SWALLOW TECHNIQUE: Single contrast examination was performed using water-soluble contrast. FLUOROSCOPY TIME:  Radiation Exposure Index (as provided by the fluoroscopic device): 158 deciGy per square cm If the device does not provide the exposure index: Fluoroscopy Time:  1 minutes 24 seconds Number of Acquired Images: COMPARISON:  CT of the chest of 02/14/2016 FINDINGS: In view of the question of possible transesophageal fistula, water-soluble contrast was utilized. Rapid  sequence spot films of the cervical esophagus show a normal swallowing mechanism. Rapid sequence spot films in the frontal projection show no extravasation with free passage of the water-soluble contrast into the stomach. Additional oblique views were obtained as well as RPO images showing no evidence of esophageal perforation. No fistula is noted. There is perhaps slight extrinsic indentation upon the esophagus immediately sub carinal but again no fistulous communication is evident. IMPRESSION: No extravasation or fistulous communication is seen associated with the esophagus. There is mild extrinsic indentation upon the sub carinal esophagus. Water-soluble contrast flows freely into the stomach. Electronically Signed   By: Ivar Drape M.D.   On: 02/29/2016 08:39   Dg Chest 2 View  03/04/2016  CLINICAL DATA:  Right bronchoscopy for lung mass. EXAM: CHEST  2 VIEW COMPARISON:  PET of 03/01/2016.  Chest radiograph 01/24/2016. FINDINGS: Midline trachea. Mild cardiomegaly. No pleural effusion or pneumothorax. Right perihilar radiation change. Right apical pulmonary nodule. Left lower lobe lung mass is relatively subtle. No consolidation or other acute superimposed finding. IMPRESSION: Bilateral lung lesions, as before. No evidence of pneumonia, pneumothorax, or other acute superimposed process. Electronically Signed   By: Abigail Miyamoto M.D.   On: 03/04/2016 12:18    Nm Pet Image Restag (ps) Skull Base To Thigh  03/01/2016  CLINICAL DATA:  Subsequent Treatment strategy for lung cancer. EXAM: NUCLEAR MEDICINE PET SKULL BASE TO THIGH TECHNIQUE: 9.3 mCi F-18 FDG was injected intravenously. Full-ring PET imaging was performed from the skull base to thigh after the radiotracer. CT data was obtained and used for attenuation correction and anatomic localization. FASTING BLOOD GLUCOSE:  Value: 110 mg/dl COMPARISON:  08/23/2010 FINDINGS: NECK No hypermetabolic lymph nodes in the neck. CHEST Right axillary node measures 7 mm  and has an SUV max equal to 4.9. Within the right supraclavicular region there is a node which measures 7 mm and has an SUV max equal to 5.3, image 53 of series 4. Low right paratracheal lymph node has a short axis measuring 1 cm and has an SUV max equal to 9.4. The sub- carinal node measures 11 mm and has an SUV max equal to 13.28, image 83 of series 4. Left lower lobe, partially necrotic mass measures 5.2 x 5.0 cm and has an SUV max equal to 15.28. In the right upper lobe there is a pulmonary nodule which measures 2.2 x 1.3 cm, image 61 of series 4. This has an SUV max equal to 18.26. No pleural fluid. Multiple small solid and cavitary nodules are scattered throughout both lungs. Solid nodule within the right upper lobe measures 7 mm, image 69 of series 4. Too small to characterize by PET-CT. Cavitary nodule within the lateral right lung base measures 7 mm, image 106 of series 4. This is too small to reliably characterize. Cavitary nodule within the left base posteriorly measures  6 mm, image 103 of series 4. Also too small to reliably characterize by PET-CT. ABDOMEN/PELVIS No abnormal uptake identified within the liver, pancreas, spleen or adrenal glands. No hypermetabolic abdominal or pelvic adenopathy. SKELETON Within the right posterior aspect of the L5 vertebral body there is a small focus of mild increased FDG uptake within SUV max equal to 3.7. New from previous exam an equivocal for bone metastases. IMPRESSION: 1. Examination is positive for multifocal hypermetabolic tumor within the chest. 2. Pulmonary nodule in the right upper lobe and necrotic mass within the left lower lobe are both hypermetabolic and worrisome for recurrent tumor and/or metastatic disease. Additionally, there are multiple solid and cavitary nodules measuring less than 1 cm identified within both lungs. These are too small to characterize by PET-CT but are suspicious for metastases. 3. Hypermetabolic right axillary, right  supraclavicular and mediastinal lymph nodes compatible with metastatic adenopathy. 4. Mild increased uptake within the posterior aspect of the L5 vertebral body is identified. No definite corresponding lytic or destructive bone lesion identified on the CT images. This is equivocal for bone metastases. Electronically Signed   By: Kerby Moors M.D.   On: 03/01/2016 16:13    I have independently reviewed the above radiology studies  and reviewed the findings with the patient.    Recent Lab Findings: Lab Results  Component Value Date   WBC 8.4 03/04/2016   HGB 11.7* 03/04/2016   HCT 37.4* 03/04/2016   PLT 239 03/04/2016   GLUCOSE 100* 03/04/2016   ALT 14* 03/04/2016   AST 16 03/04/2016   NA 138 03/04/2016   K 3.5 03/04/2016   CL 103 03/04/2016   CREATININE 0.81 03/04/2016   BUN 8 03/04/2016   CO2 25 03/04/2016   INR 0.96 03/04/2016   HGBA1C 5.6 10/19/2015      Assessment / Plan:   1 erosion through the inferior wall of the proximal left mainstem bronchus, with a small amount of gas in the middle mediastinum 2in the subcarinal region- with out acute symptoms 3 enlarging right upper lobe lung mass  4Subcarinal and low right paratracheal lymphadenopathy 5 New 4 mm left frontal lobe lesion 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 The risks of the procedure including death, infection, stroke, myocardial infarction, bleeding, blood transfusion have all been discussed specifically.  I have quoted [name] [name] a 2  % of perioperative mortality and a complication rate as high as 40 %. The patient's questions have been answered.[name] [name] is willing  to proceed with the planned procedure.  Grace Isaac MD      [address] [place] [address] Office [phone]   Beeper [phone]  03/04/2016 1:56 PM

## 2016-03-04 NOTE — Transfer of Care (Signed)
Immediate Anesthesia Transfer of Care Note  Patient: [name] [name]  Procedure(s) Performed: Procedure(s): VIDEO BRONCHOSCOPY WITH BRUSHINGS AND BIOPSY (N/A)  Patient Location: PACU  Anesthesia Type:General  Level of Consciousness: awake  Airway & Oxygen Therapy: Patient Spontanous Breathing and Patient connected to face mask oxygen  Post-op Assessment: Report given to RN, Post -op Vital signs reviewed and stable and Patient moving all extremities X 4  Post vital signs: Reviewed and stable  Last Vitals:  Filed Vitals:   03/04/16 1140 03/04/16 1633  BP: 131/84 133/73  Pulse: 48 97  Temp: 37.2 C 36.4 C  Resp: 18 14    Last Pain: There were no vitals filed for this visit.    Patients Stated Pain Goal: 6 (46/27/03 5009)  Complications: No apparent anesthesia complications

## 2016-03-04 NOTE — Anesthesia Preprocedure Evaluation (Signed)
Anesthesia Evaluation  Patient identified by MRN, date of birth, ID band Patient awake    Reviewed: Allergy & Precautions, NPO status , Patient's Chart, lab work & pertinent test results  History of Anesthesia Complications (+) PONV  Airway Mallampati: II  TM Distance: >3 FB Neck ROM: Full    Dental   Pulmonary former smoker,    breath sounds clear to auscultation       Cardiovascular hypertension, + Peripheral Vascular Disease   Rhythm:Regular Rate:Normal     Neuro/Psych    GI/Hepatic Neg liver ROS, GERD  ,  Endo/Other  negative endocrine ROS  Renal/GU negative Renal ROS     Musculoskeletal   Abdominal   Peds  Hematology   Anesthesia Other Findings   Reproductive/Obstetrics                             Anesthesia Physical Anesthesia Plan  ASA: III  Anesthesia Plan: General   Post-op Pain Management:    Induction: Intravenous  Airway Management Planned: Oral ETT  Additional Equipment:   Intra-op Plan:   Post-operative Plan: Possible Post-op intubation/ventilation  Informed Consent: I have reviewed the patients History and Physical, chart, labs and discussed the procedure including the risks, benefits and alternatives for the proposed anesthesia with the patient or authorized representative who has indicated his/her understanding and acceptance.   Dental advisory given  Plan Discussed with: CRNA and Anesthesiologist  Anesthesia Plan Comments:         Anesthesia Quick Evaluation

## 2016-03-05 ENCOUNTER — Encounter (HOSPITAL_COMMUNITY): Payer: Self-pay | Admitting: Cardiothoracic Surgery

## 2016-03-05 NOTE — Op Note (Signed)
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  PROCEDURE PERFORMED:  Bronchoscopy with biopsies and brushings under general anesthesia.  BRIEF HISTORY:  The patient is a 70 year old male with previous history of carcinoma of the lung, right middle lobe resected by Dr. Arlyce Dice at that time.  Patient also received radiation therapy.  He had had regular postoperative CT scans.  Because of recent increasing cough he was seen by Dr. 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  CT and PET scan suggested an erosion posteriorly at the distal trachea and carina, a new enlarging mass in the right upper lobe and in the left lower lobe.  This patient underwent a Gastrografin swallow that showed no evidence of tracheoesophageal fistula.  It was recommended the patient to proceed with bronchoscopy and possible navigation to biopsy of the lung lesions to obtain a tissue diagnosis.  The patient agreed and signed informed consent.  DESCRIPTION OF PROCEDURE:  The patient underwent general endotracheal anesthesia without incident and endotracheal tube was placed by Anesthesia.  Appropriate time-out was performed.  They then proceeded with fiberoptic bronchoscopy with a 2.8 mm scope at the distal trachea. There was obvious abnormality in the proximal left mainstem  bronchus medially with a large posterior erosion through and through the wall of the trachea into the paratracheal tissue posteriorly.  In this area previously placed surgical clips could be seen.  Bronchoscopy was continued and in the posterior branch of the left lower lobe there was area of near occlusion suggested of tumor.  The right mainstem bronchus was examined and adjacent to the previously noted lesion on the left there was area of erosion to the right side, but not nearly as large or obvious, but it was apparent that the tube connected to each other.  The right middle lobe bronchus was surgically closed, there did not appear to be recurrence at this area.  The remainder of the right tracheobronchial tree appeared normal.  We then proceeded with taking multiple pictures, which were loaded into the epic system under the brief op note including pictures of the carina, right upper lobe, right middle lobe, right lower lobe, left mainstem bronchus, the area of the fistula, the right mainstem bronchus and the fistula on the right and the left lower lobe.  In addition, specimens were obtained, brushings from the carina, brushings from the left lower lobe, biopsies from the left main stem bronchus and right main stem bronchus and left lower lobe.  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  The remaining  biopsy material was sent to pathology and I have personally talked to the pathologist to ensure that the specimens were handled in such a way to maximize the tissue obtained for PL1 and other genetic testing as appropriate.  The patient tolerated the procedure without obvious complication.  The scopes were removed.  He was extubated and transferred to the recovery room for postoperative care.     Lanelle Bal, MD     EG/MEDQ  D:  03/05/2016  T:  03/05/2016  Job:  [id number]

## 2016-03-06 ENCOUNTER — Telehealth: Payer: Self-pay | Admitting: *Deleted

## 2016-03-06 ENCOUNTER — Ambulatory Visit (HOSPITAL_BASED_OUTPATIENT_CLINIC_OR_DEPARTMENT_OTHER): Payer: Medicare Other

## 2016-03-06 ENCOUNTER — Ambulatory Visit (HOSPITAL_BASED_OUTPATIENT_CLINIC_OR_DEPARTMENT_OTHER): Payer: Medicare Other | Admitting: Internal Medicine

## 2016-03-06 ENCOUNTER — Other Ambulatory Visit: Payer: Self-pay | Admitting: Medical Oncology

## 2016-03-06 ENCOUNTER — Encounter: Payer: Self-pay | Admitting: Internal Medicine

## 2016-03-06 VITALS — BP 140/90 | HR 97 | Temp 98.4°F | Resp 18 | Ht 69.0 in | Wt 170.8 lb

## 2016-03-06 DIAGNOSIS — C7931 Secondary malignant neoplasm of brain: Secondary | ICD-10-CM | POA: Diagnosis not present

## 2016-03-06 DIAGNOSIS — C342 Malignant neoplasm of middle lobe, bronchus or lung: Secondary | ICD-10-CM

## 2016-03-06 DIAGNOSIS — C7951 Secondary malignant neoplasm of bone: Secondary | ICD-10-CM | POA: Diagnosis not present

## 2016-03-06 DIAGNOSIS — Z85038 Personal history of other malignant neoplasm of large intestine: Secondary | ICD-10-CM | POA: Diagnosis not present

## 2016-03-06 DIAGNOSIS — C3490 Malignant neoplasm of unspecified part of unspecified bronchus or lung: Secondary | ICD-10-CM

## 2016-03-06 DIAGNOSIS — C182 Malignant neoplasm of ascending colon: Secondary | ICD-10-CM

## 2016-03-06 LAB — COMPREHENSIVE METABOLIC PANEL
ALT: 20 U/L (ref 0–55)
ANION GAP: 8 meq/L (ref 3–11)
AST: 22 U/L (ref 5–34)
Albumin: 2.9 g/dL — ABNORMAL LOW (ref 3.5–5.0)
Alkaline Phosphatase: 84 U/L (ref 40–150)
BUN: 8.2 mg/dL (ref 7.0–26.0)
CALCIUM: 9.2 mg/dL (ref 8.4–10.4)
CHLORIDE: 99 meq/L (ref 98–109)
CO2: 28 mEq/L (ref 22–29)
Creatinine: 0.8 mg/dL (ref 0.7–1.3)
EGFR: >90 mL/min/{1.73_m2} (ref >90)
Glucose: 108 mg/dl (ref 70–140)
POTASSIUM: 3.7 meq/L (ref 3.5–5.1)
Sodium: 136 mEq/L (ref 136–145)
Total Bilirubin: 0.65 mg/dL (ref 0.20–1.20)
Total Protein: 6.6 g/dL (ref 6.4–8.3)

## 2016-03-06 LAB — CBC WITH DIFFERENTIAL/PLATELET
BASO%: 0.1 % (ref 0.0–2.0)
Basophils Absolute: 0 10*3/uL (ref 0.0–0.1)
EOS%: 0.2 % (ref 0.0–7.0)
Eosinophils Absolute: 0 10*3/uL (ref 0.0–0.5)
HEMATOCRIT: 34.3 % — AB (ref 38.4–49.9)
HGB: 11.3 g/dL — ABNORMAL LOW (ref 13.0–17.1)
LYMPH%: 8.4 % — AB (ref 14.0–49.0)
MCH: 27.1 pg — AB (ref 27.2–33.4)
MCHC: 32.9 g/dL (ref 32.0–36.0)
MCV: 82.3 fL (ref 79.3–98.0)
MONO#: 0.6 10*3/uL (ref 0.1–0.9)
MONO%: 7.2 % (ref 0.0–14.0)
NEUT#: 7 10*3/uL — ABNORMAL HIGH (ref 1.5–6.5)
NEUT%: 84.1 % — AB (ref 39.0–75.0)
Platelets: 206 10*3/uL (ref 140–400)
RBC: 4.17 10*6/uL — ABNORMAL LOW (ref 4.20–5.82)
RDW: 18.4 % — ABNORMAL HIGH (ref 11.0–14.6)
WBC: 8.4 10*3/uL (ref 4.0–10.3)
lymph#: 0.7 10*3/uL — ABNORMAL LOW (ref 0.9–3.3)
nRBC: 0 % (ref 0–0)

## 2016-03-06 NOTE — Telephone Encounter (Signed)
I called patient back due to labs can be completed at 1:45 and needing him to come in 15 minutes early.  I spoke with wife and she verbalized understanding.

## 2016-03-06 NOTE — Telephone Encounter (Signed)
Oncology Nurse Navigator Documentation  Oncology Nurse Navigator Flowsheets 03/06/2016  Navigator Location [facility]  Navigator Encounter Type Telephone  Telephone Outgoing Call  Treatment Phase Pre-Tx/Tx Discussion  Barriers/Navigation Needs Coordination of Care  Interventions Coordination of Care  Coordination of Care Appts  Acuity Level 1  Acuity Level 2 -  Time Spent with Patient 15   Per Dr. Julien Nordmann, I called patient.  I asked if he would like to be seen today instead of Friday.  Patient's wife stated yes they would like to come.  She verbalized understanding of appt time

## 2016-03-06 NOTE — Progress Notes (Signed)
[place] [phone]) [phone]   [phone]  OFFICE PROGRESS NOTE  [name], MD [place] [address]  DIAGNOSIS:  1) Stage IV (T3, T3, M1b) non-small cell lung cancer, squamous cell carcinoma presented with large left lower lobe lung mass in addition to pulmonary nodule in the right upper lobe as well as multiple solid and cavitary nodules bilaterally in addition to mediastinal, right supraclavicular and right axillary lymphadenopathy as well as bone and brain metastasis, diagnosed in June 2017. PDL 1 expression is a still pending.  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 (Pathologic T3, N0, M0). 1.5 cm primary. No vascular or lymphatic invasion.  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 4.2 cm cavitary right middle lobe lesion with associated right hilar adenopathy which was PET avid.   PRIOR THERAPY: 1) status post right hemicolectomy 06/09/2002. (Pathologic T3, N0, M0). 1.5 cm primary. No vascular or lymphatic invasion followed by  adjuvant chemotherapy with 6 cycles of 5-fluorouracil plus leucovorin. 2) status post right middle lobe resection by Dr. Arlyce Dice on 08/20/2007 following neoadjuvant chemoradiation. He received weekly carboplatin and Taxol during radiation. Radiation given between August 4 and 06/08/2007. 36 gray to the primary tumor. Boost to 45 gray. 2 additional soft tissue deposits in the right middle lobe satellite lesions versus second primary also treated with a final total tumor dose of 65 gray.  CURRENT THERAPY: Observation.  INTERVAL HISTORY: [name] [name] 70 y.o. male returns to the clinic today for followup visit accompanied by his wife. The patient is doing  fine today except for fatigue as well as cough and shortness of breath. He was found recently to have elevated CEA and repeat imaging studies including CT scan of the chest, abdomen and pelvis showed metastatic disease in the lung as well as bone. This was followed by a PET scan and MRI of the brain which showed significant multifocal hypermetabolic tumor within the chest including large left lower lobe lung mass as well as the right upper lobe pulmonary nodule and bilateral solid and cavitary pulmonary nodules in addition to suspicious L5 vertebral body metastasis. The brain MRI also showed 4 mm solitary brain metastasis. The patient was seen by Dr. Servando Snare and he underwent bronchoscopy with biopsies and brushings of the right lower lobe as well as the left upper lobe lesions and the final pathology was consistent with squamous cell carcinoma. The patient is here today for evaluation and discussion of his treatment options.  MEDICAL HISTORY: Past Medical History  Diagnosis Date  . Cancer ([employer])     lung/colon ca  . Colon cancer ([facility]) 01/24/2013    T3N0M0  right colon  R hemicolectomy   . Cancer of middle lobe of lung ([facility]) 01/24/2013    4.5 cm RML w associated R hilar adenopathy S/P chemo/RT then Surgery 8/08  . BPH (benign prostatic hyperplasia) 01/24/2013  . GERD (gastroesophageal reflux disease)   . Raynauds phenomenon   . Rheumatoid arthritis(714.0) 01/24/2013    Since age 48  . Collagen vascular disease ([facility])     RA  . PONV (postoperative nausea and vomiting)     adrenal insufficiency; needs steriod prior to procedures  . Adrenal insufficiency ([place])   . Lung mass   . HOH (  hard of hearing)   . Headache   . Chronic back pain     ALLERGIES:  is allergic to aspirin; diphenhydramine hcl; and remicade.  MEDICATIONS:  Current Outpatient Prescriptions  Medication Sig Dispense Refill  . amLODipine (NORVASC) 2.5 MG tablet Take 2.5 mg by mouth daily with lunch.     . Calcium Carbonate-Vitamin  D (CALCIUM + D PO) Take 1 tablet by mouth daily.     Marland Kitchen leflunomide (ARAVA) 10 MG tablet Take 10 mg by mouth daily.     Marland Kitchen LORazepam (ATIVAN) 0.5 MG tablet Take 1 tablet (0.5 mg total) by mouth once. Take 1/2 tablet ( 0.25 mg) one hour prior to scan and may repeat with other 1/2 tablet ( 0.25 mg) 30 minutes prior to scan prn 1 tablet 0  . omeprazole (PRILOSEC) 20 MG capsule Take 20 mg by mouth daily.     . predniSONE (DELTASONE) 5 MG tablet Take 5 mg by mouth daily with breakfast.    . vitamin C (ASCORBIC ACID) 500 MG tablet Take 500 mg by mouth daily.    . vitamin E 400 UNIT capsule Take 400 Units by mouth daily.    Marland Kitchen acetaminophen (TYLENOL) 500 MG tablet Take 1,500 mg by mouth every 6 (six) hours as needed (For pain.). Reported on 03/06/2016     No current facility-administered medications for this visit.    SURGICAL HISTORY:  Past Surgical History  Procedure Laterality Date  . Gastrectomy  1974    bleeding ulcers  . Lumbar disc surgery  2003  . Lung lobectomy  2007    right  . Tendons ruptured  2010    right-hand  . Colectomy  2004    right-hemi  . Appendectomy    . Inguinal hernia repair Right 06/03/2014    Procedure: HERNIA REPAIR INGUINAL ADULT;  Surgeon: Kaylyn Lim, MD;  Location: [facility];  Service: General;  Laterality: Right;  . Transurethral resection of prostate N/A 10/19/2015    Procedure: TRANSURETHRAL RESECTION OF THE PROSTATE (TURP);  Surgeon: Carolan Clines, MD;  Location: [facility];  Service: Urology;  Laterality: N/A;  . Cystoscopy N/A 10/19/2015    Procedure: CYSTOSCOPY;  Surgeon: Carolan Clines, MD;  Location: [facility];  Service: Urology;  Laterality: N/A;  . Video bronchoscopy with endobronchial navigation N/A 03/04/2016    Procedure: VIDEO BRONCHOSCOPY WITH BRUSHINGS AND BIOPSY;  Surgeon: Grace Isaac, MD;  Location: [facility];  Service: Thoracic;  Laterality: N/A;    REVIEW OF SYSTEMS:  Constitutional: positive for fatigue Eyes: negative Ears,  nose, mouth, throat, and face: negative Respiratory: positive for cough, dyspnea on exertion and sputum Cardiovascular: negative Gastrointestinal: negative Genitourinary:negative Integument/breast: negative Hematologic/lymphatic: negative Musculoskeletal:positive for back pain Neurological: negative Behavioral/Psych: negative Endocrine: negative Allergic/Immunologic: negative   PHYSICAL EXAMINATION: General appearance: alert, cooperative and no distress Head: Normocephalic, without obvious abnormality, atraumatic Neck: no adenopathy, no JVD, supple, symmetrical, trachea midline and thyroid not enlarged, symmetric, no tenderness/mass/nodules Lymph nodes: Cervical, supraclavicular, and axillary nodes normal. Resp: clear to auscultation bilaterally Back: symmetric, no curvature. ROM normal. No CVA tenderness. Cardio: regular rate and rhythm, S1, S2 normal, no murmur, click, rub or gallop GI: soft, non-tender; bowel sounds normal; no masses,  no organomegaly Extremities: extremities normal, atraumatic, no cyanosis or edema Neurologic: Alert and oriented X 3, normal strength and tone. Normal symmetric reflexes. Normal coordination and gait  ECOG PERFORMANCE STATUS: 1 - Symptomatic but completely ambulatory  Blood pressure 140/90, pulse 97, temperature 98.4 F (  36.9 C), temperature source Oral, resp. rate 18, height '5\' 9"'$  (1.753 m), weight 170 lb 12.8 oz (77.474 kg), SpO2 100 %.  LABORATORY DATA: Lab Results  Component Value Date   WBC 8.4 03/06/2016   HGB 11.3* 03/06/2016   HCT 34.3* 03/06/2016   MCV 82.3 03/06/2016   PLT 206 03/06/2016      Chemistry      Component Value Date/Time   NA 138 03/04/2016 1147   NA 131* 01/24/2016 1416   NA 138 02/21/2012 1241   K 3.5 03/04/2016 1147   K 4.0 01/24/2016 1416   K 4.5 02/21/2012 1241   CL 103 03/04/2016 1147   CL 98 01/15/2013 1454   CL 97* 02/21/2012 1241   CO2 25 03/04/2016 1147   CO2 25 01/24/2016 1416   CO2 30 02/21/2012  1241   BUN 8 03/04/2016 1147   BUN 8.2 01/24/2016 1416   BUN 5* 02/21/2012 1241   CREATININE 0.81 03/04/2016 1147   CREATININE 0.8 01/24/2016 1416   CREATININE 1.0 02/21/2012 1241      Component Value Date/Time   CALCIUM 9.3 03/04/2016 1147   CALCIUM 9.3 01/24/2016 1416   CALCIUM 9.1 02/21/2012 1241   ALKPHOS 76 03/04/2016 1147   ALKPHOS 78 01/24/2016 1416   ALKPHOS 50 02/20/2011 1308   AST 16 03/04/2016 1147   AST 14 01/24/2016 1416   AST 21 02/20/2011 1308   ALT 14* 03/04/2016 1147   ALT 11 01/24/2016 1416   ALT 20 02/20/2011 1308   BILITOT 1.0 03/04/2016 1147   BILITOT 0.55 01/24/2016 1416   BILITOT 1.40 02/20/2011 1308       RADIOGRAPHIC STUDIES: Dg Chest 2 View  03/04/2016  CLINICAL DATA:  Right bronchoscopy for lung mass. EXAM: CHEST  2 VIEW COMPARISON:  PET of 03/01/2016.  Chest radiograph 01/24/2016. FINDINGS: Midline trachea. Mild cardiomegaly. No pleural effusion or pneumothorax. Right perihilar radiation change. Right apical pulmonary nodule. Left lower lobe lung mass is relatively subtle. No consolidation or other acute superimposed finding. IMPRESSION: Bilateral lung lesions, as before. No evidence of pneumonia, pneumothorax, or other acute superimposed process. Electronically Signed   By: Abigail Miyamoto M.D.   On: 03/04/2016 12:18   Ct Chest W Contrast  02/14/2016  CLINICAL DATA:  70 year old male with remote history of colon cancer, and diagnosis of lung cancer 7 years ago status post right middle lobectomy. Cough and chest pain for the past 2-3 months. EXAM: CT CHEST, ABDOMEN, AND PELVIS WITH CONTRAST TECHNIQUE: Multidetector CT imaging of the chest, abdomen and pelvis was performed following the standard protocol during bolus administration of intravenous contrast. CONTRAST:  170m OMNIPAQUE IOHEXOL 350 MG/ML SOLN COMPARISON:  CT the chest, abdomen and pelvis 01/28/2013. FINDINGS: CT CHEST FINDINGS Mediastinum/Lymph Nodes: Heart size is normal. There is no significant  pericardial fluid, thickening or pericardial calcification. There is atherosclerosis of the thoracic aorta, the great vessels of the mediastinum and the coronary arteries, including calcified atherosclerotic plaque in the left main, left anterior descending and right coronary arteries. There is no significant pericardial fluid, thickening or pericardial calcification. Enhancing 12 mm low right paratracheal lymph node. No hilar lymphadenopathy. Axial image 31 of series 2 and coronal image 69 of series 602 demonstrate a defect in the inferior wall of the proximal left mainstem bronchus, which appears to communicate to a thick-walled cavitary area in the subcarinal region, which contains a couple of surgical clips. This abnormal soft tissue in this region is suspicious for lymphadenopathy, measuring up  to 2.0 cm on axial image 34 of series 2. This is immediately anterior to the esophagus, and whether or not this cavitary area communicates with the lumen of the esophagus is uncertain (not favored). Multiple surgical clips in the right hilar region related to prior right middle lobectomy. No axillary lymphadenopathy. Lungs/Pleura: Status post right middle lobectomy. 5.7 x 6.1 x 7.8 cm macrolobulated mass in the anterior aspect of the left lower lobe (axial image 51 of series 2 and coronal image 70 of series 602). 3.1 x 1.6 x 2.3 cm macrolobulated mass in the apex of the right upper lobe (axial image 36 of series 5 and coronal image 8 of series 602) has some spiculated margins and demonstrates overlying pleural retraction. Ovoid shaped 1.7 x 1.1 cm nodule in the right upper lobe (image 97 of series 5). Several other smaller sub cm pulmonary nodules are noted elsewhere throughout the lungs bilaterally, at least 2 of which are cavitary in the periphery of the right lower lobe (image 139 of series 5) and periphery of the left lower lobe (image 124 of series 5). Extensive mass-like architectural distortion in the central  aspect of the remaining right upper and lower lobes, most compatible with postradiation mass-like fibrosis. Throughout these regions there is cylindrical and varicose bronchiectasis. Mild diffuse bronchial wall thickening with moderate centrilobular and paraseptal emphysema. No acute consolidative airspace disease. Trace right pleural effusion lying dependently. No left pleural effusion. Musculoskeletal/Soft Tissues: There are no aggressive appearing lytic or blastic lesions noted in the visualized portions of the skeleton. CT ABDOMEN AND PELVIS FINDINGS Hepatobiliary: No cystic or solid hepatic lesions. No intra or extrahepatic biliary ductal dilatation. Gallbladder is normal in appearance. Pancreas: No pancreatic mass. No pancreatic ductal dilatation. No pancreatic or peripancreatic fluid or inflammatory changes. Spleen: Unremarkable. Adrenals/Urinary Tract: 3.7 x 1.6 cm left adrenal mass is only slightly larger than remote prior study from 01/28/2013, favored to reflect hyperplasia. Right adrenal gland is normal in appearance. Bilateral kidneys are normal in appearance. Calcifications in the renal hila bilaterally appear to be vascular. No hydroureteronephrosis. Urinary bladder is normal in appearance. Stomach/Bowel: Surgical clips near the gastroesophageal junction. The appearance of the stomach is normal. 12 mm fatty attenuation lesion in the third portion of the duodenum (image 82 of series 2) only slightly larger than remote prior study 01/28/2013 in retrospect, likely to represent a tiny duodenal lipoma. No pathologic dilatation of small bowel or colon. 1.8 cm fatty attenuation lesion in the distal descending colon (image 85 of series 2) may represent a lipoma or lipomatous polyp. The appendix is not confidently identified may be surgically absent. Regardless, there are no inflammatory changes noted adjacent to the cecum to suggest the presence of an acute appendicitis at this time. Vascular/Lymphatic:  Atherosclerosis throughout the abdominal and pelvic vasculature, without evidence of aneurysm or dissection. No lymphadenopathy noted in the abdomen or pelvis. Reproductive: Prostate gland and seminal vesicles are unremarkable in appearance. Other: No significant volume of ascites.  No pneumoperitoneum. Musculoskeletal: There are no aggressive appearing lytic or blastic lesions noted in the visualized portions of the skeleton. IMPRESSION: 1. Multiple new pulmonary nodules and masses, concerning for either metastatic disease to the lungs, or multiple new synchronous primaries (or some combination thereof). Further evaluation with PET-CT should be considered to evaluate these findings and evaluate for additional sites of metastatic disease. 2. Importantly, there appears to be an erosion through the inferior wall of the proximal left mainstem bronchus, with a small amount of gas  in the middle mediastinum in the subcarinal region. This mediastinal gas appears to be entrapped within some enhancing soft tissue in this subcarinal region (i.e., there does not appear to be pneumomediastinum elsewhere at this time), and there is no rim enhancing mediastinal fluid collection to suggest abscess at this time. However, this finding likely predisposes the patient to development of mediastinitis. Correlation with bronchoscopy is recommended, for potential covered stent placement if clinically appropriate. 3. Subcarinal and low right paratracheal lymphadenopathy may be malignant, or could be reactive given the findings in number 2 above. 4. Given the presence of some small cavitary nodules in the lungs bilaterally, coincident infection is not excluded. However, should one of these lesions prove to be a squamous cell carcinoma, small cavitary nodules may represent metastatic lesions. 5. Mass-like thickening of the left adrenal gland relatively similar appearance to the prior examination from 2014, likely represent adrenal  hyperplasia. 6. Fact containing lesion in the distal descending colon may represent a colonic lipoma or a lipomatous polyp. Correlation with nonemergent colonoscopy is suggested in the near future. 7. 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 8. Atherosclerosis, including left main and 2 vessel coronary artery disease. 9. Additional incidental findings, as above. These results were called by telephone at the time of interpretation on 02/14/2016 at 4:40 pm to Dr. Curt Bears , who verbally acknowledged these results. Electronically Signed   By: Vinnie Langton M.D.   On: 02/14/2016 16:40   Mr Jeri Cos LN Contrast  03/01/2016  CLINICAL DATA:  History of lung and colon cancer. Evaluation for metastatic disease. EXAM: MRI HEAD WITHOUT AND WITH CONTRAST TECHNIQUE: Multiplanar, multiecho pulse sequences of the brain and surrounding structures were obtained without and with intravenous contrast. CONTRAST:  71m MULTIHANCE GADOBENATE DIMEGLUMINE 529 MG/ML IV SOLN COMPARISON:  Contrast-enhanced head CT 01/20/2009. Brain MRI 04/13/2007. FINDINGS: The study is mildly motion degraded throughout. There is no evidence of acute infarct, intracranial hemorrhage, midline shift, or extra-axial fluid collection. Ventricles and sulci are normal for age. Patchy T2 hyperintensities throughout the cerebral white matter bilaterally have progressed from the prior MRI and are nonspecific but may reflect a combination of chronic small vessel ischemic disease and posttherapy change. Mild T2 signal changes are present in the pons. There is a new chronic lacunar infarct at the lateral margin of the left thalamus. There is a new 4 mm enhancing lesion in the left middle frontal gyrus (series 11, image 47) without significant edema. No other enhancing brain lesions are identified within limitations of motion artifact. No  suspicious skull lesions are identified. Orbits are unremarkable. There is a right maxillary sinus mucous retention cyst. Mastoid air cells are clear. Major intracranial vascular flow voids are preserved, with the left vertebral artery again seen to be dominant. IMPRESSION: 1. 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 2. Progressive cerebral white matter disease. Electronically Signed   By: ALogan BoresM.D.   On: 03/01/2016 17:23   Ct Abdomen Pelvis W Contrast  02/14/2016  CLINICAL DATA:  70year old male with remote history of colon cancer, and diagnosis of lung cancer 7 years ago status post right middle lobectomy. Cough and chest pain for the past 2-3 months. EXAM: CT CHEST, ABDOMEN, AND PELVIS WITH CONTRAST TECHNIQUE: Multidetector CT imaging of the chest, abdomen and pelvis was performed following the standard protocol during bolus administration  of intravenous contrast. CONTRAST:  154m OMNIPAQUE IOHEXOL 350 MG/ML SOLN COMPARISON:  CT the chest, abdomen and pelvis 01/28/2013. FINDINGS: CT CHEST FINDINGS Mediastinum/Lymph Nodes: Heart size is normal. There is no significant pericardial fluid, thickening or pericardial calcification. There is atherosclerosis of the thoracic aorta, the great vessels of the mediastinum and the coronary arteries, including calcified atherosclerotic plaque in the left main, left anterior descending and right coronary arteries. There is no significant pericardial fluid, thickening or pericardial calcification. Enhancing 12 mm low right paratracheal lymph node. No hilar lymphadenopathy. Axial image 31 of series 2 and coronal image 69 of series 602 demonstrate a defect in the inferior wall of the proximal left mainstem bronchus, which appears to communicate to a thick-walled cavitary area in the subcarinal region, which contains a couple of surgical clips. This abnormal soft tissue in this region is suspicious for lymphadenopathy, measuring up to  2.0 cm on axial image 34 of series 2. This is immediately anterior to the esophagus, and whether or not this cavitary area communicates with the lumen of the esophagus is uncertain (not favored). Multiple surgical clips in the right hilar region related to prior right middle lobectomy. No axillary lymphadenopathy. Lungs/Pleura: Status post right middle lobectomy. 5.7 x 6.1 x 7.8 cm macrolobulated mass in the anterior aspect of the left lower lobe (axial image 51 of series 2 and coronal image 70 of series 602). 3.1 x 1.6 x 2.3 cm macrolobulated mass in the apex of the right upper lobe (axial image 36 of series 5 and coronal image 8 of series 602) has some spiculated margins and demonstrates overlying pleural retraction. Ovoid shaped 1.7 x 1.1 cm nodule in the right upper lobe (image 97 of series 5). Several other smaller sub cm pulmonary nodules are noted elsewhere throughout the lungs bilaterally, at least 2 of which are cavitary in the periphery of the right lower lobe (image 139 of series 5) and periphery of the left lower lobe (image 124 of series 5). Extensive mass-like architectural distortion in the central aspect of the remaining right upper and lower lobes, most compatible with postradiation mass-like fibrosis. Throughout these regions there is cylindrical and varicose bronchiectasis. Mild diffuse bronchial wall thickening with moderate centrilobular and paraseptal emphysema. No acute consolidative airspace disease. Trace right pleural effusion lying dependently. No left pleural effusion. Musculoskeletal/Soft Tissues: There are no aggressive appearing lytic or blastic lesions noted in the visualized portions of the skeleton. CT ABDOMEN AND PELVIS FINDINGS Hepatobiliary: No cystic or solid hepatic lesions. No intra or extrahepatic biliary ductal dilatation. Gallbladder is normal in appearance. Pancreas: No pancreatic mass. No pancreatic ductal dilatation. No pancreatic or peripancreatic fluid or inflammatory  changes. Spleen: Unremarkable. Adrenals/Urinary Tract: 3.7 x 1.6 cm left adrenal mass is only slightly larger than remote prior study from 01/28/2013, favored to reflect hyperplasia. Right adrenal gland is normal in appearance. Bilateral kidneys are normal in appearance. Calcifications in the renal hila bilaterally appear to be vascular. No hydroureteronephrosis. Urinary bladder is normal in appearance. Stomach/Bowel: Surgical clips near the gastroesophageal junction. The appearance of the stomach is normal. 12 mm fatty attenuation lesion in the third portion of the duodenum (image 82 of series 2) only slightly larger than remote prior study 01/28/2013 in retrospect, likely to represent a tiny duodenal lipoma. No pathologic dilatation of small bowel or colon. 1.8 cm fatty attenuation lesion in the distal descending colon (image 85 of series 2) may represent a lipoma or lipomatous polyp. The appendix is not confidently identified may  be surgically absent. Regardless, there are no inflammatory changes noted adjacent to the cecum to suggest the presence of an acute appendicitis at this time. Vascular/Lymphatic: Atherosclerosis throughout the abdominal and pelvic vasculature, without evidence of aneurysm or dissection. No lymphadenopathy noted in the abdomen or pelvis. Reproductive: Prostate gland and seminal vesicles are unremarkable in appearance. Other: No significant volume of ascites.  No pneumoperitoneum. Musculoskeletal: There are no aggressive appearing lytic or blastic lesions noted in the visualized portions of the skeleton. IMPRESSION: 1. Multiple new pulmonary nodules and masses, concerning for either metastatic disease to the lungs, or multiple new synchronous primaries (or some combination thereof). Further evaluation with PET-CT should be considered to evaluate these findings and evaluate for additional sites of metastatic disease. 2. Importantly, there appears to be an erosion through the inferior wall  of the proximal left mainstem bronchus, with a small amount of gas in the middle mediastinum in the subcarinal region. This mediastinal gas appears to be entrapped within some enhancing soft tissue in this subcarinal region (i.e., there does not appear to be pneumomediastinum elsewhere at this time), and there is no rim enhancing mediastinal fluid collection to suggest abscess at this time. However, this finding likely predisposes the patient to development of mediastinitis. Correlation with bronchoscopy is recommended, for potential covered stent placement if clinically appropriate. 3. Subcarinal and low right paratracheal lymphadenopathy may be malignant, or could be reactive given the findings in number 2 above. 4. Given the presence of some small cavitary nodules in the lungs bilaterally, coincident infection is not excluded. However, should one of these lesions prove to be a squamous cell carcinoma, small cavitary nodules may represent metastatic lesions. 5. Mass-like thickening of the left adrenal gland relatively similar appearance to the prior examination from 2014, likely represent adrenal hyperplasia. 6. Fact containing lesion in the distal descending colon may represent a colonic lipoma or a lipomatous polyp. Correlation with nonemergent colonoscopy is suggested in the near future. 7. 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 8. Atherosclerosis, including left main and 2 vessel coronary artery disease. 9. Additional incidental findings, as above. These results were called by telephone at the time of interpretation on 02/14/2016 at 4:40 pm to Dr. Curt Bears , who verbally acknowledged these results. Electronically Signed   By: Vinnie Langton M.D.   On: 02/14/2016 16:40   Nm Pet Image Restag (ps) Skull Base To Thigh  03/01/2016  CLINICAL DATA:  Subsequent Treatment strategy for  lung cancer. EXAM: NUCLEAR MEDICINE PET SKULL BASE TO THIGH TECHNIQUE: 9.3 mCi F-18 FDG was injected intravenously. Full-ring PET imaging was performed from the skull base to thigh after the radiotracer. CT data was obtained and used for attenuation correction and anatomic localization. FASTING BLOOD GLUCOSE:  Value: 110 mg/dl COMPARISON:  08/23/2010 FINDINGS: NECK No hypermetabolic lymph nodes in the neck. CHEST Right axillary node measures 7 mm and has an SUV max equal to 4.9. Within the right supraclavicular region there is a node which measures 7 mm and has an SUV max equal to 5.3, image 53 of series 4. Low right paratracheal lymph node has a short axis measuring 1 cm and has an SUV max equal to 9.4. The sub- carinal node measures 11 mm and has an SUV max equal to 13.28, image 83 of series 4. Left lower lobe, partially necrotic mass measures 5.2 x 5.0 cm and has  an SUV max equal to 15.28. In the right upper lobe there is a pulmonary nodule which measures 2.2 x 1.3 cm, image 61 of series 4. This has an SUV max equal to 18.26. No pleural fluid. Multiple small solid and cavitary nodules are scattered throughout both lungs. Solid nodule within the right upper lobe measures 7 mm, image 69 of series 4. Too small to characterize by PET-CT. Cavitary nodule within the lateral right lung base measures 7 mm, image 106 of series 4. This is too small to reliably characterize. Cavitary nodule within the left base posteriorly measures 6 mm, image 103 of series 4. Also too small to reliably characterize by PET-CT. ABDOMEN/PELVIS No abnormal uptake identified within the liver, pancreas, spleen or adrenal glands. No hypermetabolic abdominal or pelvic adenopathy. SKELETON Within the right posterior aspect of the L5 vertebral body there is a small focus of mild increased FDG uptake within SUV max equal to 3.7. New from previous exam an equivocal for bone metastases. IMPRESSION: 1. Examination is positive for multifocal  hypermetabolic tumor within the chest. 2. Pulmonary nodule in the right upper lobe and necrotic mass within the left lower lobe are both hypermetabolic and worrisome for recurrent tumor and/or metastatic disease. Additionally, there are multiple solid and cavitary nodules measuring less than 1 cm identified within both lungs. These are too small to characterize by PET-CT but are suspicious for metastases. 3. Hypermetabolic right axillary, right supraclavicular and mediastinal lymph nodes compatible with metastatic adenopathy. 4. Mild increased uptake within the posterior aspect of the L5 vertebral body is identified. No definite corresponding lytic or destructive bone lesion identified on the CT images. This is equivocal for bone metastases. Electronically Signed   By: Kerby Moors M.D.   On: 03/01/2016 16:13   Dg Epidural/nerve Root  02/27/2016  CLINICAL DATA:  Lumbosacral spondylosis without myelopathy. Partial improvement after the previous injection. Extruded disc fragment at L4-5 on the LEFT. Severe LEFT leg pain has recurred. EXAM: EPIDURAL/NERVE ROOT FLUOROSCOPY TIME:  20 seconds corresponding to a Dose Area Product of 49.53 Gy*m2 PROCEDURE: The procedure, risks, benefits, and alternatives were explained to the patient. Questions regarding the procedure were encouraged and answered. The patient understands and consents to the procedure. LEFT L5 NERVE ROOT BLOCK AND TRANSFORAMINAL EPIDURAL: A posterior oblique approach was taken to the intervertebral foramen on the LEFT at L5-S1 using a curved 22 gauge spinal needle. Injection of Isovue-M 200 outlined the LEFT L5 nerve root and showed good cephalad epidural spread to the L4-5 disc space. No vascular opacification is seen. 120.0 mg of Depo-Medrol mixed with 2 mL 1% lidocaine were instilled. The procedure was well-tolerated, and the patient was discharged thirty minutes following the injection in good condition. COMPLICATIONS: None IMPRESSION: Technically  successful injection consisting of a LEFT L5 nerve root block and transforaminal epidural. Electronically Signed   By: Staci Righter M.D.   On: 02/27/2016 11:09   Dg Esophagus W/water Sol Cm  02/29/2016  CLINICAL DATA:  Mediastinal mass, evaluate for transesophageal fistula EXAM: ESOPHOGRAM/BARIUM SWALLOW TECHNIQUE: Single contrast examination was performed using water-soluble contrast. FLUOROSCOPY TIME:  Radiation Exposure Index (as provided by the fluoroscopic device): 158 deciGy per square cm If the device does not provide the exposure index: Fluoroscopy Time:  1 minutes 24 seconds Number of Acquired Images: COMPARISON:  CT of the chest of 02/14/2016 FINDINGS: In view of the question of possible transesophageal fistula, water-soluble contrast was utilized. Rapid sequence spot films of the cervical esophagus show a  normal swallowing mechanism. Rapid sequence spot films in the frontal projection show no extravasation with free passage of the water-soluble contrast into the stomach. Additional oblique views were obtained as well as RPO images showing no evidence of esophageal perforation. No fistula is noted. There is perhaps slight extrinsic indentation upon the esophagus immediately sub carinal but again no fistulous communication is evident. IMPRESSION: No extravasation or fistulous communication is seen associated with the esophagus. There is mild extrinsic indentation upon the sub carinal esophagus. Water-soluble contrast flows freely into the stomach. Electronically Signed   By: Ivar Drape M.D.   On: 02/29/2016 08:39   ASSESSMENT AND PLAN: This is a very pleasant 70 years old white male with  1) history of stage II colon adenocarcinoma diagnosed in 2003   2) history stage III non-small cell lung cancer diagnosed in 2008 status post dissection as well as adjuvant therapy. He has been observation since that time with no significant evidence for disease recurrence.  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 Last year it was 4.1.  I ordered repeat CT scan of the chest, abdomen and pelvis which was performed a few days ago and unfortunately it showed multiple new pulmonary nodules and masses concerning for either metastatic disease to the lung or multiple new synchronous primaries. There was also what appears to be an erosion through the inferior wall of the proximal left mainstem bronchus with a small amount of gas in the middle mediastinum and the subcarinal region. There was also evidence of subcarinal and lower right paratracheal lymphadenopathy. The scan also showed fat containing lesion in the distal descending colon questionable for colonic lipoma or lipomatous polyp. This was followed by a PET scan which showed hypermetabolic activity in the left lower lobe lung mass in addition to the right upper lobe pulmonary nodule as well as multiple solid and cavitary bilateral pulmonary nodules and suspicious L5 bone metastasis. 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 The patient underwent bronchoscopy under the care of Dr. Servando Snare and the final pathology is consistent with squamous cell carcinoma. I had a lengthy discussion with the patient and his wife today about his current disease stage, prognosis and treatment options. I recommended for the patient to see Dr. Tammi Klippel for consideration of palliative radiotherapy to the solitary brain lesion in addition to the suspicious metastatic lesion at L5. I also discussed with the patient other treatment options including palliative care versus systemic chemotherapy with carboplatin and Abraxane versus treatment with immunotherapy with Ketruda (pembrolizumab) if PDL 1 expression is 50% or higher. D PDL 1 test is a still pending. I will arrange for the  patient to come back for follow-up visit in 7-10 days for more detailed discussion of his treatment options once the PDL 1 test is available. The patient and his wife agreed to the current plan. He was advised to call immediately if he has any concerning symptoms in the interval. The patient voices understanding of current disease status and treatment options and is in agreement with the current care plan.  All questions were answered. The patient knows to call the clinic with any problems, questions or concerns. We can certainly see the patient much sooner if necessary.  Disclaimer: This note was dictated with voice recognition software. Similar sounding words can  inadvertently be transcribed and may not be corrected upon review.       

## 2016-03-08 ENCOUNTER — Ambulatory Visit: Payer: Medicare Other | Admitting: Internal Medicine

## 2016-03-13 ENCOUNTER — Encounter (HOSPITAL_COMMUNITY): Payer: Self-pay

## 2016-03-13 ENCOUNTER — Institutional Professional Consult (permissible substitution): Payer: Medicare Other | Admitting: Radiation Oncology

## 2016-03-13 ENCOUNTER — Telehealth: Payer: Self-pay | Admitting: *Deleted

## 2016-03-13 ENCOUNTER — Ambulatory Visit
Admission: RE | Admit: 2016-03-13 | Discharge: 2016-03-13 | Disposition: A | Discharge status: Home / Self Care | Payer: Medicare Other | Source: Ambulatory Visit | Attending: Radiation Oncology | Admitting: Radiation Oncology

## 2016-03-13 ENCOUNTER — Telehealth: Payer: Self-pay | Admitting: Medical Oncology

## 2016-03-13 ENCOUNTER — Encounter: Payer: Self-pay | Admitting: Radiation Oncology

## 2016-03-13 VITALS — BP 149/102 | HR 61 | Resp 16 | Wt 169.9 lb

## 2016-03-13 DIAGNOSIS — Z9889 Other specified postprocedural states: Secondary | ICD-10-CM | POA: Diagnosis not present

## 2016-03-13 DIAGNOSIS — Z510 Encounter for antineoplastic radiation therapy: Secondary | ICD-10-CM | POA: Diagnosis not present

## 2016-03-13 DIAGNOSIS — Z85038 Personal history of other malignant neoplasm of large intestine: Secondary | ICD-10-CM | POA: Diagnosis not present

## 2016-03-13 DIAGNOSIS — Z87891 Personal history of nicotine dependence: Secondary | ICD-10-CM | POA: Insufficient documentation

## 2016-03-13 DIAGNOSIS — M549 Dorsalgia, unspecified: Secondary | ICD-10-CM | POA: Insufficient documentation

## 2016-03-13 DIAGNOSIS — C7949 Secondary malignant neoplasm of other parts of nervous system: Secondary | ICD-10-CM | POA: Insufficient documentation

## 2016-03-13 DIAGNOSIS — E2740 Unspecified adrenocortical insufficiency: Secondary | ICD-10-CM | POA: Insufficient documentation

## 2016-03-13 DIAGNOSIS — K219 Gastro-esophageal reflux disease without esophagitis: Secondary | ICD-10-CM | POA: Diagnosis not present

## 2016-03-13 DIAGNOSIS — G8929 Other chronic pain: Secondary | ICD-10-CM | POA: Insufficient documentation

## 2016-03-13 DIAGNOSIS — C7931 Secondary malignant neoplasm of brain: Secondary | ICD-10-CM | POA: Insufficient documentation

## 2016-03-13 DIAGNOSIS — M069 Rheumatoid arthritis, unspecified: Secondary | ICD-10-CM | POA: Insufficient documentation

## 2016-03-13 DIAGNOSIS — R918 Other nonspecific abnormal finding of lung field: Secondary | ICD-10-CM | POA: Diagnosis not present

## 2016-03-13 DIAGNOSIS — C3490 Malignant neoplasm of unspecified part of unspecified bronchus or lung: Secondary | ICD-10-CM

## 2016-03-13 DIAGNOSIS — C7951 Secondary malignant neoplasm of bone: Secondary | ICD-10-CM

## 2016-03-13 DIAGNOSIS — N400 Enlarged prostate without lower urinary tract symptoms: Secondary | ICD-10-CM | POA: Diagnosis not present

## 2016-03-13 DIAGNOSIS — Z85118 Personal history of other malignant neoplasm of bronchus and lung: Secondary | ICD-10-CM | POA: Diagnosis not present

## 2016-03-13 HISTORY — DX: Malignant neoplasm of brain, unspecified: C71.9

## 2016-03-13 HISTORY — DX: Malignant neoplasm of bone and articular cartilage, unspecified: C41.9

## 2016-03-13 NOTE — Telephone Encounter (Signed)
TC from patient's wife wondering if 'special' test results are back yet.  Pt is seeing Dr. Tammi Klippel this afternoon @ 3:30 and wonders if she can have those results today.  She was unclear as to the type of test results.

## 2016-03-13 NOTE — Progress Notes (Signed)
Radiation Oncology         [phone] ________________________________  Outpatient Consultation  Name: [name] [name] MRN: [id number]  Date: 03/13/2016  DOB: [birth year]  YD:XAJOINOM JR, Julieanne Manson, MD  [name].,*   REFERRING PHYSICIAN: [name].,*  DIAGNOSIS: The primary encounter diagnosis was Bone metastasis ([place]). A diagnosis of Secondary malignant neoplasm of brain and spinal cord [facility]) was also pertinent to this visit.    ICD-9-CM ICD-10-CM   1. Bone metastasis (HCC) 198.5 C79.51   2. Secondary malignant neoplasm of brain and spinal cord (HCC) 198.3 C79.31     C79.49     HISTORY OF PRESENT ILLNESS: [name] [name] is a 70 y.o. male seen at the request of Dr. Julien Nordmann for recurrent lung cancer with brain metastases, and possible lumbar spine involvement. He has a remote history of colon cancer and underwent right hemicolectomy in 2003 followed by 5-FU and leucovorin. 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 He underwent lobectomy followed by chemotherapy and radiotherapy. He has been followed in surveillance since treatment. He has been seeing Dr. Julien Nordmann in the last 2 years for evaluations and in May 2017 he complained of chest pain with cough. His chest x ray revealed fullness in the right hilum, and an apical nodule in the right. A CT of the chest on 02/14/16, revealed a 12 mm low right paratracheal lymph node. Nohilar lymphadenopathy. A defect in the inferior wall of the proximal left mainstem bronchus, appeared to communicate to a thick-walled cavitary area in the subcarinal region, this abnormal soft tissue in this region is suspicious for lymphadenopathy, measuring up to 2.0 cm. This is immediately anterior to the esophagus and may communicate with the lumen of the esophagus, but not certain. He is s/p right middle lobectomy. A 5.7 x 6.1 x 7.8 cm macrolobulated mass in the  anterior aspect of the left lower lobe was seen, and a. 3.1x 1.6 x 2.3 cm macrolobulated mass in the apex of the right upper lobe has some spiculated margins and demonstrates overlying pleural retraction. Ovoid shaped 1.7 x 1.1 cm nodule in the right upper lobe. Several other smaller sub cm pulmonary nodules are noted elsewhere throughout the lungs bilaterally, at least 2 of which are cavitary in the periphery of the right lower lobe and periphery of the left lower lobe. Extensive mass-like architectural distortion in the central aspect of the remaining right upper and lower lobes, most compatible with postradiation mass-like fibrosis.Throughout these regions there is cylindrical and varicose bronchiectasis. Mild diffuse bronchial wall thickening with moderate centrilobular and paraseptal emphysema. No acute consolidative airspace disease. Trace right pleural effusion lying dependently. No left pleural effusion.  A PET scan on 03/01/16 revealed hypermetabolism in the visualized tumors bilaterally as well as within the right axillary, right supraclavicular, and mediastinal nodes. HE was found to have increased uptake in the posterior aspect of L5 vertebral body. It is notable that the patient has suffered with back pain in the same region of the lumbar spine. Dr. Rolin Barry performed ?laminectomy around 2002, and he developed pain in the lumbar region on the right side about 4 months ago. His pain was so troublesome that he received a steroid injection in the early spring, and then again on 02/27/16, just prior to his PET scan. He also underwent an MRI of the brain on 03/01/16 which revealed a solitary 36m left frontal lobe  lesion. He comes today to discuss the options for radiation to the brain, possibly the L5 lesion, and to consider treating the lung lesions.   PREVIOUS RADIATION THERAPY: Yes   04/20/2007-06/08/2007. 36 gray to the primary tumor. Boost to 45 gray. 2 additional soft tissue deposits in the right  middle lobe satellite lesions versus second primary also treated with a final total tumor dose of 65 gray.   PAST MEDICAL HISTORY:  Past Medical History  Diagnosis Date  . Cancer ([place])     lung/colon ca  . Colon cancer ([place]) 01/24/2013    T3N0M0  right colon  R hemicolectomy   . Cancer of middle lobe of lung ([place]) 01/24/2013    4.5 cm RML w associated R hilar adenopathy S/P chemo/RT then Surgery 8/08  . BPH (benign prostatic hyperplasia) 01/24/2013  . GERD (gastroesophageal reflux disease)   . Raynauds phenomenon   . Rheumatoid arthritis(714.0) 01/24/2013    Since age 51  . Collagen vascular disease (HCC)     RA  . PONV (postoperative nausea and vomiting)     adrenal insufficiency; needs steriod prior to procedures  . Adrenal insufficiency ([place])   . Lung mass   . HOH (hard of hearing)   . Headache   . Chronic back pain   . Brain cancer ([place])     4 mm solitary brain metastasis  . Bone cancer ([place])     L5 bone metastasis      PAST SURGICAL HISTORY: Past Surgical History  Procedure Laterality Date  . Gastrectomy  1974    bleeding ulcers  . Lumbar disc surgery  2003  . Lung lobectomy  2007    right  . Tendons ruptured  2010    right-hand  . Colectomy  2004    right-hemi  . Appendectomy    . Inguinal hernia repair Right 06/03/2014    Procedure: HERNIA REPAIR INGUINAL ADULT;  Surgeon: Kaylyn Lim, MD;  Location: [address];  Service: General;  Laterality: Right;  . Transurethral resection of prostate N/A 10/19/2015    Procedure: TRANSURETHRAL RESECTION OF THE PROSTATE (TURP);  Surgeon: Carolan Clines, MD;  Location: [facility];  Service: Urology;  Laterality: N/A;  . Cystoscopy N/A 10/19/2015    Procedure: CYSTOSCOPY;  Surgeon: Carolan Clines, MD;  Location: [facility];  Service: Urology;  Laterality: N/A;  . Video bronchoscopy with endobronchial navigation N/A 03/04/2016    Procedure: VIDEO BRONCHOSCOPY WITH BRUSHINGS AND BIOPSY;  Surgeon: Grace Isaac, MD;   Location: [facility];  Service: Thoracic;  Laterality: N/A;    FAMILY HISTORY:  Family History  Problem Relation Age of Onset  . Colon cancer Neg Hx   . Pancreatic cancer Neg Hx   . Stomach cancer Neg Hx   . Cancer Sister   . Cancer Brother     SOCIAL HISTORY:  Social History   Social History  . Marital Status: Married    Spouse Name: N/A  . Number of Children: N/A  . Years of Education: N/A   Occupational History  . Not on file.   Social History Main Topics  . Smoking status: Former Smoker    Quit date: 05/30/2006  . Smokeless tobacco: Never Used  . Alcohol Use: 11.4 oz/week    12 Cans of beer, 7 Shots of liquor per week     Comment: drinks beer 5 days a week  . Drug Use: No  . Sexual Activity: No   Other Topics Concern  .  Not on file   Social History Narrative    ALLERGIES: Aspirin; Diphenhydramine hcl; and Remicade  MEDICATIONS:  Current Outpatient Prescriptions  Medication Sig Dispense Refill  . acetaminophen (TYLENOL) 500 MG tablet Take 1,500 mg by mouth every 6 (six) hours as needed (For pain.). Reported on 03/06/2016    . amLODipine (NORVASC) 2.5 MG tablet Take 2.5 mg by mouth daily with lunch.     . Calcium Carbonate-Vitamin D (CALCIUM + D PO) Take 1 tablet by mouth daily.     Marland Kitchen leflunomide (ARAVA) 10 MG tablet Take 10 mg by mouth daily.     Marland Kitchen omeprazole (PRILOSEC) 20 MG capsule Take 20 mg by mouth daily.     . predniSONE (DELTASONE) 5 MG tablet Take 5 mg by mouth daily with breakfast.    . vitamin C (ASCORBIC ACID) 500 MG tablet Take 500 mg by mouth daily.    . vitamin E 400 UNIT capsule Take 400 Units by mouth daily.     No current facility-administered medications for this encounter.    REVIEW OF SYSTEMS:  On review of systems, the patient reports that he has been doing ok. He denies any chest pain at rest, but when he experiences episodes of cough, he does have sternal pain. His back pain is stable since his steroid injection, he does note some numbness  in his lower extremities, left greater than right but denies this being a new problem. He describes this since his back operation. He denies any incontinent episodes. He denies any fevers, chills, night sweats, unintended weight changes. He does admit to early satiety, but denies any bowel or bladder disturbances, and denies abdominal pain, nausea or vomiting. He denies any new musculoskeletal or joint aches or pains, and reports his lumbar pain has improved as well. A complete review of systems is obtained and is otherwise negative.    PHYSICAL EXAM:  weight is 169 lb 14.4 oz (77.066 kg). His blood pressure is 149/102 and his pulse is 61. His respiration is 16 and oxygen saturation is 100%.   Pain Scale 0/10 In general this is a well appearing Caucasian male in no acute distress. He is alert and oriented x4 and appropriate throughout the examination. HEENT reveals that the patient is normocephalic, atraumatic. EOMs are intact. PERRLA. Skin is intact without any evidence of gross lesions. Cardiovascular exam reveals a regular rate and rhythm, no clicks rubs or murmurs are auscultated. Chest is clear to auscultation bilaterally. Lymphatic assessment is performed and does not reveal any adenopathy in the cervical, supraclavicular, axillary, or inguinal chains. Abdomen has active bowel sounds in all quadrants and is intact. The abdomen is soft, non tender, non distended. Lower extremities are negative for pretibial pitting edema, deep calf tenderness, cyanosis or clubbing. Palpation of the spine does not reproduce his pain, but he is able to identify the location of his pain over the lower aspect of the lumbar spine, along the base of the gluteal cleft. He has intact sensation of the tibial surfaces of his lower extremities, and has intact strength bilaterally of the lower extremities 5/5. His DTRs are equilateral, and he does not have any gait disturbances of foot drop.   KPS = 80  100 - Normal; no  complaints; no evidence of disease. 90   - Able to carry on normal activity; minor signs or symptoms of disease. 80   - Normal activity with effort; some signs or symptoms of disease. 23   - Cares for self;  unable to carry on normal activity or to do active work. 60   - Requires occasional assistance, but is able to care for most of his personal needs. 50   - Requires considerable assistance and frequent medical care. 34   - Disabled; requires special care and assistance. 66   - Severely disabled; hospital admission is indicated although death not imminent. 94   - Very sick; hospital admission necessary; active supportive treatment necessary. 10   - Moribund; fatal processes progressing rapidly. 0     - Dead  Karnofsky DA, Abelmann [place], Craver LS and [place] JH [phone]) The use of the nitrogen mustards in the palliative treatment of carcinoma: with particular reference to bronchogenic carcinoma Cancer 1 [id number]  LABORATORY DATA:  Lab Results  Component Value Date   WBC 8.4 03/06/2016   HGB 11.3* 03/06/2016   HCT 34.3* 03/06/2016   MCV 82.3 03/06/2016   PLT 206 03/06/2016   Lab Results  Component Value Date   NA 136 03/06/2016   K 3.7 03/06/2016   CL 103 03/04/2016   CO2 28 03/06/2016   Lab Results  Component Value Date   ALT 20 03/06/2016   AST 22 03/06/2016   ALKPHOS 84 03/06/2016   BILITOT 0.65 03/06/2016     RADIOGRAPHY: Dg Chest 2 View  03/04/2016  CLINICAL DATA:  Right bronchoscopy for lung mass. EXAM: CHEST  2 VIEW COMPARISON:  PET of 03/01/2016.  Chest radiograph 01/24/2016. FINDINGS: Midline trachea. Mild cardiomegaly. No pleural effusion or pneumothorax. Right perihilar radiation change. Right apical pulmonary nodule. Left lower lobe lung mass is relatively subtle. No consolidation or other acute superimposed finding. IMPRESSION: Bilateral lung lesions, as before. No evidence of pneumonia, pneumothorax, or other acute superimposed process. Electronically Signed   By:  Abigail Miyamoto M.D.   On: 03/04/2016 12:18   Ct Chest W Contrast  02/14/2016  CLINICAL DATA:  70 year old male with remote history of colon cancer, and diagnosis of lung cancer 7 years ago status post right middle lobectomy. Cough and chest pain for the past 2-3 months. EXAM: CT CHEST, ABDOMEN, AND PELVIS WITH CONTRAST TECHNIQUE: Multidetector CT imaging of the chest, abdomen and pelvis was performed following the standard protocol during bolus administration of intravenous contrast. CONTRAST:  195m OMNIPAQUE IOHEXOL 350 MG/ML SOLN COMPARISON:  CT the chest, abdomen and pelvis 01/28/2013. FINDINGS: CT CHEST FINDINGS Mediastinum/Lymph Nodes: Heart size is normal. There is no significant pericardial fluid, thickening or pericardial calcification. There is atherosclerosis of the thoracic aorta, the great vessels of the mediastinum and the coronary arteries, including calcified atherosclerotic plaque in the left main, left anterior descending and right coronary arteries. There is no significant pericardial fluid, thickening or pericardial calcification. Enhancing 12 mm low right paratracheal lymph node. No hilar lymphadenopathy. Axial image 31 of series 2 and coronal image 69 of series 602 demonstrate a defect in the inferior wall of the proximal left mainstem bronchus, which appears to communicate to a thick-walled cavitary area in the subcarinal region, which contains a couple of surgical clips. This abnormal soft tissue in this region is suspicious for lymphadenopathy, measuring up to 2.0 cm on axial image 34 of series 2. This is immediately anterior to the esophagus, and whether or not this cavitary area communicates with the lumen of the esophagus is uncertain (not favored). Multiple surgical clips in the right hilar region related to prior right middle lobectomy. No axillary lymphadenopathy. Lungs/Pleura: Status post right middle lobectomy. 5.7 x 6.1 x 7.8 cm macrolobulated  mass in the anterior aspect of the  left lower lobe (axial image 51 of series 2 and coronal image 70 of series 602). 3.1 x 1.6 x 2.3 cm macrolobulated mass in the apex of the right upper lobe (axial image 36 of series 5 and coronal image 8 of series 602) has some spiculated margins and demonstrates overlying pleural retraction. Ovoid shaped 1.7 x 1.1 cm nodule in the right upper lobe (image 97 of series 5). Several other smaller sub cm pulmonary nodules are noted elsewhere throughout the lungs bilaterally, at least 2 of which are cavitary in the periphery of the right lower lobe (image 139 of series 5) and periphery of the left lower lobe (image 124 of series 5). Extensive mass-like architectural distortion in the central aspect of the remaining right upper and lower lobes, most compatible with postradiation mass-like fibrosis. Throughout these regions there is cylindrical and varicose bronchiectasis. Mild diffuse bronchial wall thickening with moderate centrilobular and paraseptal emphysema. No acute consolidative airspace disease. Trace right pleural effusion lying dependently. No left pleural effusion. Musculoskeletal/Soft Tissues: There are no aggressive appearing lytic or blastic lesions noted in the visualized portions of the skeleton. CT ABDOMEN AND PELVIS FINDINGS Hepatobiliary: No cystic or solid hepatic lesions. No intra or extrahepatic biliary ductal dilatation. Gallbladder is normal in appearance. Pancreas: No pancreatic mass. No pancreatic ductal dilatation. No pancreatic or peripancreatic fluid or inflammatory changes. Spleen: Unremarkable. Adrenals/Urinary Tract: 3.7 x 1.6 cm left adrenal mass is only slightly larger than remote prior study from 01/28/2013, favored to reflect hyperplasia. Right adrenal gland is normal in appearance. Bilateral kidneys are normal in appearance. Calcifications in the renal hila bilaterally appear to be vascular. No hydroureteronephrosis. Urinary bladder is normal in appearance. Stomach/Bowel: Surgical  clips near the gastroesophageal junction. The appearance of the stomach is normal. 12 mm fatty attenuation lesion in the third portion of the duodenum (image 82 of series 2) only slightly larger than remote prior study 01/28/2013 in retrospect, likely to represent a tiny duodenal lipoma. No pathologic dilatation of small bowel or colon. 1.8 cm fatty attenuation lesion in the distal descending colon (image 85 of series 2) may represent a lipoma or lipomatous polyp. The appendix is not confidently identified may be surgically absent. Regardless, there are no inflammatory changes noted adjacent to the cecum to suggest the presence of an acute appendicitis at this time. Vascular/Lymphatic: Atherosclerosis throughout the abdominal and pelvic vasculature, without evidence of aneurysm or dissection. No lymphadenopathy noted in the abdomen or pelvis. Reproductive: Prostate gland and seminal vesicles are unremarkable in appearance. Other: No significant volume of ascites.  No pneumoperitoneum. Musculoskeletal: There are no aggressive appearing lytic or blastic lesions noted in the visualized portions of the skeleton. IMPRESSION: 1. Multiple new pulmonary nodules and masses, concerning for either metastatic disease to the lungs, or multiple new synchronous primaries (or some combination thereof). Further evaluation with PET-CT should be considered to evaluate these findings and evaluate for additional sites of metastatic disease. 2. Importantly, there appears to be an erosion through the inferior wall of the proximal left mainstem bronchus, with a small amount of gas in the middle mediastinum in the subcarinal region. This mediastinal gas appears to be entrapped within some enhancing soft tissue in this subcarinal region (i.e., there does not appear to be pneumomediastinum elsewhere at this time), and there is no rim enhancing mediastinal fluid collection to suggest abscess at this time. However, this finding likely  predisposes the patient to development of mediastinitis. Correlation with  bronchoscopy is recommended, for potential covered stent placement if clinically appropriate. 3. Subcarinal and low right paratracheal lymphadenopathy may be malignant, or could be reactive given the findings in number 2 above. 4. Given the presence of some small cavitary nodules in the lungs bilaterally, coincident infection is not excluded. However, should one of these lesions prove to be a squamous cell carcinoma, small cavitary nodules may represent metastatic lesions. 5. Mass-like thickening of the left adrenal gland relatively similar appearance to the prior examination from 2014, likely represent adrenal hyperplasia. 6. Fact containing lesion in the distal descending colon may represent a colonic lipoma or a lipomatous polyp. Correlation with nonemergent colonoscopy is suggested in the near future. 7. 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 8. Atherosclerosis, including left main and 2 vessel coronary artery disease. 9. Additional incidental findings, as above. These results were called by telephone at the time of interpretation on 02/14/2016 at 4:40 pm to Dr. Curt Bears , who verbally acknowledged these results. Electronically Signed   By: Vinnie Langton M.D.   On: 02/14/2016 16:40   Mr Jeri Cos IE Contrast  03/01/2016  CLINICAL DATA:  History of lung and colon cancer. Evaluation for metastatic disease. EXAM: MRI HEAD WITHOUT AND WITH CONTRAST TECHNIQUE: Multiplanar, multiecho pulse sequences of the brain and surrounding structures were obtained without and with intravenous contrast. CONTRAST:  71m MULTIHANCE GADOBENATE DIMEGLUMINE 529 MG/ML IV SOLN COMPARISON:  Contrast-enhanced head CT 01/20/2009. Brain MRI 04/13/2007. FINDINGS: The study is mildly motion degraded throughout. There is no evidence of  acute infarct, intracranial hemorrhage, midline shift, or extra-axial fluid collection. Ventricles and sulci are normal for age. Patchy T2 hyperintensities throughout the cerebral white matter bilaterally have progressed from the prior MRI and are nonspecific but may reflect a combination of chronic small vessel ischemic disease and posttherapy change. Mild T2 signal changes are present in the pons. There is a new chronic lacunar infarct at the lateral margin of the left thalamus. There is a new 4 mm enhancing lesion in the left middle frontal gyrus (series 11, image 47) without significant edema. No other enhancing brain lesions are identified within limitations of motion artifact. No suspicious skull lesions are identified. Orbits are unremarkable. There is a right maxillary sinus mucous retention cyst. Mastoid air cells are clear. Major intracranial vascular flow voids are preserved, with the left vertebral artery again seen to be dominant. IMPRESSION: 1. 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 2. Progressive cerebral white matter disease. Electronically Signed   By: ALogan BoresM.D.   On: 03/01/2016 17:23   Ct Abdomen Pelvis W Contrast  02/14/2016  CLINICAL DATA:  70year old male with remote history of colon cancer, and diagnosis of lung cancer 7 years ago status post right middle lobectomy. Cough and chest pain for the past 2-3 months. EXAM: CT CHEST, ABDOMEN, AND PELVIS WITH CONTRAST TECHNIQUE: Multidetector CT imaging of the chest, abdomen and pelvis was performed following the standard protocol during bolus administration of intravenous contrast. CONTRAST:  1065mOMNIPAQUE IOHEXOL 350 MG/ML SOLN COMPARISON:  CT the chest, abdomen and pelvis 01/28/2013. FINDINGS: CT CHEST FINDINGS Mediastinum/Lymph Nodes: Heart size is normal. There is no significant pericardial fluid, thickening or pericardial calcification. There is atherosclerosis of the thoracic aorta, the great  vessels of the mediastinum and the coronary arteries, including calcified atherosclerotic plaque in the left  main, left anterior descending and right coronary arteries. There is no significant pericardial fluid, thickening or pericardial calcification. Enhancing 12 mm low right paratracheal lymph node. No hilar lymphadenopathy. Axial image 31 of series 2 and coronal image 69 of series 602 demonstrate a defect in the inferior wall of the proximal left mainstem bronchus, which appears to communicate to a thick-walled cavitary area in the subcarinal region, which contains a couple of surgical clips. This abnormal soft tissue in this region is suspicious for lymphadenopathy, measuring up to 2.0 cm on axial image 34 of series 2. This is immediately anterior to the esophagus, and whether or not this cavitary area communicates with the lumen of the esophagus is uncertain (not favored). Multiple surgical clips in the right hilar region related to prior right middle lobectomy. No axillary lymphadenopathy. Lungs/Pleura: Status post right middle lobectomy. 5.7 x 6.1 x 7.8 cm macrolobulated mass in the anterior aspect of the left lower lobe (axial image 51 of series 2 and coronal image 70 of series 602). 3.1 x 1.6 x 2.3 cm macrolobulated mass in the apex of the right upper lobe (axial image 36 of series 5 and coronal image 8 of series 602) has some spiculated margins and demonstrates overlying pleural retraction. Ovoid shaped 1.7 x 1.1 cm nodule in the right upper lobe (image 97 of series 5). Several other smaller sub cm pulmonary nodules are noted elsewhere throughout the lungs bilaterally, at least 2 of which are cavitary in the periphery of the right lower lobe (image 139 of series 5) and periphery of the left lower lobe (image 124 of series 5). Extensive mass-like architectural distortion in the central aspect of the remaining right upper and lower lobes, most compatible with postradiation mass-like fibrosis. Throughout  these regions there is cylindrical and varicose bronchiectasis. Mild diffuse bronchial wall thickening with moderate centrilobular and paraseptal emphysema. No acute consolidative airspace disease. Trace right pleural effusion lying dependently. No left pleural effusion. Musculoskeletal/Soft Tissues: There are no aggressive appearing lytic or blastic lesions noted in the visualized portions of the skeleton. CT ABDOMEN AND PELVIS FINDINGS Hepatobiliary: No cystic or solid hepatic lesions. No intra or extrahepatic biliary ductal dilatation. Gallbladder is normal in appearance. Pancreas: No pancreatic mass. No pancreatic ductal dilatation. No pancreatic or peripancreatic fluid or inflammatory changes. Spleen: Unremarkable. Adrenals/Urinary Tract: 3.7 x 1.6 cm left adrenal mass is only slightly larger than remote prior study from 01/28/2013, favored to reflect hyperplasia. Right adrenal gland is normal in appearance. Bilateral kidneys are normal in appearance. Calcifications in the renal hila bilaterally appear to be vascular. No hydroureteronephrosis. Urinary bladder is normal in appearance. Stomach/Bowel: Surgical clips near the gastroesophageal junction. The appearance of the stomach is normal. 12 mm fatty attenuation lesion in the third portion of the duodenum (image 82 of series 2) only slightly larger than remote prior study 01/28/2013 in retrospect, likely to represent a tiny duodenal lipoma. No pathologic dilatation of small bowel or colon. 1.8 cm fatty attenuation lesion in the distal descending colon (image 85 of series 2) may represent a lipoma or lipomatous polyp. The appendix is not confidently identified may be surgically absent. Regardless, there are no inflammatory changes noted adjacent to the cecum to suggest the presence of an acute appendicitis at this time. Vascular/Lymphatic: Atherosclerosis throughout the abdominal and pelvic vasculature, without evidence of aneurysm or dissection. No  lymphadenopathy noted in the abdomen or pelvis. Reproductive: Prostate gland and seminal vesicles are unremarkable in appearance. Other: No significant volume of ascites.  No pneumoperitoneum. Musculoskeletal: There are no aggressive appearing lytic or blastic lesions noted in the visualized portions of the skeleton. IMPRESSION: 1. Multiple new pulmonary nodules and masses, concerning for either metastatic disease to the lungs, or multiple new synchronous primaries (or some combination thereof). Further evaluation with PET-CT should be considered to evaluate these findings and evaluate for additional sites of metastatic disease. 2. Importantly, there appears to be an erosion through the inferior wall of the proximal left mainstem bronchus, with a small amount of gas in the middle mediastinum in the subcarinal region. This mediastinal gas appears to be entrapped within some enhancing soft tissue in this subcarinal region (i.e., there does not appear to be pneumomediastinum elsewhere at this time), and there is no rim enhancing mediastinal fluid collection to suggest abscess at this time. However, this finding likely predisposes the patient to development of mediastinitis. Correlation with bronchoscopy is recommended, for potential covered stent placement if clinically appropriate. 3. Subcarinal and low right paratracheal lymphadenopathy may be malignant, or could be reactive given the findings in number 2 above. 4. Given the presence of some small cavitary nodules in the lungs bilaterally, coincident infection is not excluded. However, should one of these lesions prove to be a squamous cell carcinoma, small cavitary nodules may represent metastatic lesions. 5. Mass-like thickening of the left adrenal gland relatively similar appearance to the prior examination from 2014, likely represent adrenal hyperplasia. 6. Fact containing lesion in the distal descending colon may represent a colonic lipoma or a lipomatous polyp.  Correlation with nonemergent colonoscopy is suggested in the near future. 7. 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 8. Atherosclerosis, including left main and 2 vessel coronary artery disease. 9. Additional incidental findings, as above. These results were called by telephone at the time of interpretation on 02/14/2016 at 4:40 pm to Dr. Curt Bears , who verbally acknowledged these results. Electronically Signed   By: Vinnie Langton M.D.   On: 02/14/2016 16:40   Nm Pet Image Restag (ps) Skull Base To Thigh  03/01/2016  CLINICAL DATA:  Subsequent Treatment strategy for lung cancer. EXAM: NUCLEAR MEDICINE PET SKULL BASE TO THIGH TECHNIQUE: 9.3 mCi F-18 FDG was injected intravenously. Full-ring PET imaging was performed from the skull base to thigh after the radiotracer. CT data was obtained and used for attenuation correction and anatomic localization. FASTING BLOOD GLUCOSE:  Value: 110 mg/dl COMPARISON:  08/23/2010 FINDINGS: NECK No hypermetabolic lymph nodes in the neck. CHEST Right axillary node measures 7 mm and has an SUV max equal to 4.9. Within the right supraclavicular region there is a node which measures 7 mm and has an SUV max equal to 5.3, image 53 of series 4. Low right paratracheal lymph node has a short axis measuring 1 cm and has an SUV max equal to 9.4. The sub- carinal node measures 11 mm and has an SUV max equal to 13.28, image 83 of series 4. Left lower lobe, partially necrotic mass measures 5.2 x 5.0 cm and has an SUV max equal to 15.28. In the right upper lobe there is a pulmonary nodule which measures 2.2 x 1.3 cm, image 61 of series 4. This has an SUV max equal to 18.26. No pleural fluid. Multiple small solid and cavitary nodules are scattered throughout both lungs. Solid nodule within the right upper lobe measures 7 mm, image 69 of series 4. Too  small to  characterize by PET-CT. Cavitary nodule within the lateral right lung base measures 7 mm, image 106 of series 4. This is too small to reliably characterize. Cavitary nodule within the left base posteriorly measures 6 mm, image 103 of series 4. Also too small to reliably characterize by PET-CT. ABDOMEN/PELVIS No abnormal uptake identified within the liver, pancreas, spleen or adrenal glands. No hypermetabolic abdominal or pelvic adenopathy. SKELETON Within the right posterior aspect of the L5 vertebral body there is a small focus of mild increased FDG uptake within SUV max equal to 3.7. New from previous exam an equivocal for bone metastases. IMPRESSION: 1. Examination is positive for multifocal hypermetabolic tumor within the chest. 2. Pulmonary nodule in the right upper lobe and necrotic mass within the left lower lobe are both hypermetabolic and worrisome for recurrent tumor and/or metastatic disease. Additionally, there are multiple solid and cavitary nodules measuring less than 1 cm identified within both lungs. These are too small to characterize by PET-CT but are suspicious for metastases. 3. Hypermetabolic right axillary, right supraclavicular and mediastinal lymph nodes compatible with metastatic adenopathy. 4. Mild increased uptake within the posterior aspect of the L5 vertebral body is identified. No definite corresponding lytic or destructive bone lesion identified on the CT images. This is equivocal for bone metastases. Electronically Signed   By: Kerby Moors M.D.   On: 03/01/2016 16:13   Dg Epidural/nerve Root  02/27/2016  CLINICAL DATA:  Lumbosacral spondylosis without myelopathy. Partial improvement after the previous injection. Extruded disc fragment at L4-5 on the LEFT. Severe LEFT leg pain has recurred. EXAM: EPIDURAL/NERVE ROOT FLUOROSCOPY TIME:  20 seconds corresponding to a Dose Area Product of 49.53 Gy*m2 PROCEDURE: The procedure, risks, benefits, and alternatives were explained to the  patient. Questions regarding the procedure were encouraged and answered. The patient understands and consents to the procedure. LEFT L5 NERVE ROOT BLOCK AND TRANSFORAMINAL EPIDURAL: A posterior oblique approach was taken to the intervertebral foramen on the LEFT at L5-S1 using a curved 22 gauge spinal needle. Injection of Isovue-M 200 outlined the LEFT L5 nerve root and showed good cephalad epidural spread to the L4-5 disc space. No vascular opacification is seen. 120.0 mg of Depo-Medrol mixed with 2 mL 1% lidocaine were instilled. The procedure was well-tolerated, and the patient was discharged thirty minutes following the injection in good condition. COMPLICATIONS: None IMPRESSION: Technically successful injection consisting of a LEFT L5 nerve root block and transforaminal epidural. Electronically Signed   By: Staci Righter M.D.   On: 02/27/2016 11:09   Dg Esophagus W/water Sol Cm  02/29/2016  CLINICAL DATA:  Mediastinal mass, evaluate for transesophageal fistula EXAM: ESOPHOGRAM/BARIUM SWALLOW TECHNIQUE: Single contrast examination was performed using water-soluble contrast. FLUOROSCOPY TIME:  Radiation Exposure Index (as provided by the fluoroscopic device): 158 deciGy per square cm If the device does not provide the exposure index: Fluoroscopy Time:  1 minutes 24 seconds Number of Acquired Images: COMPARISON:  CT of the chest of 02/14/2016 FINDINGS: In view of the question of possible transesophageal fistula, water-soluble contrast was utilized. Rapid sequence spot films of the cervical esophagus show a normal swallowing mechanism. Rapid sequence spot films in the frontal projection show no extravasation with free passage of the water-soluble contrast into the stomach. Additional oblique views were obtained as well as RPO images showing no evidence of esophageal perforation. No fistula is noted. There is perhaps slight extrinsic indentation upon the esophagus immediately sub carinal but again no fistulous  communication is evident. IMPRESSION:  No extravasation or fistulous communication is seen associated with the esophagus. There is mild extrinsic indentation upon the sub carinal esophagus. Water-soluble contrast flows freely into the stomach. Electronically Signed   By: Ivar Drape M.D.   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    PLAN: Dr. Tammi Klippel discusses the extensive findings on the patient's CT, PET, and MRI scans. He reviews the concerns for the possible communication of the bronchus and mediastinum, and because of this and the possible severe complications, would not recommend proceeding with radiotherapy to the chest at this time. He would like to determine the patient's response to systemic therapy, and consider this as an option later on if he does not respond to therapy. With regard to his lumbar lesion, we will defer clinical determination of whether or not this truly appears malignant to Dr. Saintclair Halsted, whom the patient will see tomorrow. For his brain metastases however, Dr. Tammi Klippel would recommend a T3 MRI to rule out other sites of disease which could benefit from treatment as well. We will plan this and appreciate Dr. Windy Carina input as we move forward. The patient states agreement and understanding, and we will continue to begin the process of coordinating CT simulation following his MRI to proceed with SRS. We will also continue to discuss whether or not to treat the L5 lesion, or follow this with serial imaging.   The  above documentation reflects my direct findings during this shared patient visit. Please see the separate note by Dr. Tammi Klippel on this date for the remainder of the patient's plan of care.   Carola Rhine, PAC

## 2016-03-13 NOTE — Progress Notes (Signed)
See progress note under physician encounter. 

## 2016-03-13 NOTE — Telephone Encounter (Signed)
Oncology Nurse Navigator Documentation  Oncology Nurse Navigator Flowsheets 03/13/2016  Navigator Location [facility]  Navigator Encounter Type Telephone  Telephone Outgoing Call  Treatment Phase Pre-Tx/Tx Discussion  Barriers/Navigation Needs Education  Education Other  Interventions Education Method  Education Method Verbal  Acuity Level 1  Time Spent with Patient 30   Patient's wife called and would like results of molecular test.  I reviewed with Dr. Julien Nordmann.  I called and spoke with Mrs. Kaeding.  I updated her on results and that Mr. [name] [name] will have chemo 1st line.  She would like to know when treatment will start.  I stated once patient sees Rad Onc today we will have more information on when to start.  She was thankful for the call and update.

## 2016-03-13 NOTE — Progress Notes (Signed)
Histology and Location of Primary Cancer:  1) Stage IV (T3, T3, M1b) non-small cell lung cancer, squamous cell carcinoma presented with large left lower lobe lung mass in addition to pulmonary nodule in the right upper lobe as well as multiple solid and cavitary nodules bilaterally in addition to mediastinal, right supraclavicular and right axillary lymphadenopathy as well as bone and brain metastasis, diagnosed in June 2017. PDL 1 expression is a still pending.  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 (Pathologic T3, N0, M0). 1.5 cm primary. No vascular or lymphatic invasion.  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 4.2 cm cavitary right middle lobe lesion with associated right hilar adenopathy which was PET avid.  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 (Pathologic T3, N0, M0). 1.5 cm primary. No vascular or lymphatic invasion followed by  adjuvant chemotherapy with 6 cycles of 5-fluorouracil plus leucovorin.  2) status post right middle lobe resection by Dr. Arlyce Dice on 08/20/2007 following neoadjuvant chemoradiation. He received weekly carboplatin and Taxol during radiation. Radiation given between August 4 and 06/08/2007. 36 gray to the primary tumor. Boost to 45 gray. 2 additional soft tissue deposits in the right middle lobe satellite lesions versus second primary also treated with a final total tumor dose of 65 gray.  CURRENT THERAPY: Observation   Pain on a scale of 0-10 is: Reports since second cortisone injection in May he has only slight intermittent positional  pain.   If Spine Met(s), symptoms, if any, include:  Bowel/Bladder retention or incontinence (please describe): reports intermittent diarrhea related to colon surgery. Reports bladder complaints resolved following turp  Numbness or weakness in extremities (please describe): reports numbness and tingling in left leg and foot that have been present for year but, is no worse  Current Decadron regimen, if applicable: prednisone 5 mg daily for RA  Ambulatory status? Walker? Wheelchair?: ambulatory  SAFETY ISSUES:  Prior radiation? Yes; Aug 2008-Sept 2008  Pacemaker/ICD? no  Possible current pregnancy? no  Is the patient on methotrexate? no  Current Complaints / other details:  70 year old male. AX: remicade, aspirin, diphenhydramine

## 2016-03-13 NOTE — Telephone Encounter (Signed)
Wants molecular study result.

## 2016-03-14 ENCOUNTER — Encounter: Payer: Self-pay | Admitting: Radiation Therapy

## 2016-03-14 ENCOUNTER — Other Ambulatory Visit: Payer: Self-pay | Admitting: Radiation Therapy

## 2016-03-14 ENCOUNTER — Encounter: Payer: Self-pay | Admitting: Radiation Oncology

## 2016-03-14 DIAGNOSIS — C7949 Secondary malignant neoplasm of other parts of nervous system: Principal | ICD-10-CM

## 2016-03-14 DIAGNOSIS — C7931 Secondary malignant neoplasm of brain: Secondary | ICD-10-CM

## 2016-03-14 NOTE — Progress Notes (Signed)
The patient met with Dr. Saintclair Halsted today and the recommendation was for re-imaging of the lumbar spine in 3 months with another MRI.

## 2016-03-14 NOTE — Progress Notes (Addendum)
1.  Do you need a wheel chair?  no    2. On oxygen? no  3. Have you ever had any surgery in the body part being scanned?  no  4. Have you ever had any surgery on your brain or heart?  no       5. Have you ever had surgery on your eyes or ears?    no                            6. Do you have a pacemaker or defibrillator?   no  7. Do you have a Neurostimulator?  no       8. Claustrophobic?  Yes- will have an Ativan.  Needs it called into the [facility] in [place]   9. Any risk for metal in eyes?  no  10. Injury by bullet, buckshot, or shrapnel? no  11. Stent?    no                                                                                                         12. Hx of Cancer?    yes       Primary is lung with mets to the brain and spine                                                                                                    13. Kidney or Liver disease?  no  14. Hx of Lupus, Rheumatoid Arthritis or Scleroderma?  RA  15. IV Antibiotics or long term use of NSAIDS?  no  16. HX of Hypertension?  no  17. Diabetes?  no  18. Allergy to contrast?  no  19. Recent labs. Labs drawn 6/21 and in [facility]   Questions were answered over the phone by the patient and his wife.  Mont Dutton R.T.(R)(T) Special Procedures Navigator

## 2016-03-15 ENCOUNTER — Telehealth: Payer: Self-pay | Admitting: Radiation Oncology

## 2016-03-15 ENCOUNTER — Other Ambulatory Visit: Payer: Self-pay | Admitting: Radiation Oncology

## 2016-03-15 MED ORDER — LORAZEPAM 0.5 MG PO TABS: ORAL_TABLET | ORAL | Status: DC

## 2016-03-15 NOTE — Telephone Encounter (Signed)
Phoned patient's wife, Velva Harman. Informed her that Ativan had been escribed to her husband's [facility] in [place]. She verbalized understanding and expressed appreciation for the call.

## 2016-03-18 ENCOUNTER — Telehealth: Payer: Self-pay | Admitting: Internal Medicine

## 2016-03-18 ENCOUNTER — Ambulatory Visit (HOSPITAL_BASED_OUTPATIENT_CLINIC_OR_DEPARTMENT_OTHER): Payer: Medicare Other | Admitting: Internal Medicine

## 2016-03-18 ENCOUNTER — Encounter: Payer: Self-pay | Admitting: *Deleted

## 2016-03-18 ENCOUNTER — Telehealth: Payer: Self-pay

## 2016-03-18 ENCOUNTER — Encounter: Payer: Self-pay | Admitting: Internal Medicine

## 2016-03-18 ENCOUNTER — Telehealth: Payer: Self-pay | Admitting: *Deleted

## 2016-03-18 VITALS — BP 154/94 | HR 106 | Temp 98.1°F | Resp 18 | Ht 69.0 in | Wt 165.7 lb

## 2016-03-18 DIAGNOSIS — C3492 Malignant neoplasm of unspecified part of left bronchus or lung: Secondary | ICD-10-CM

## 2016-03-18 DIAGNOSIS — C3490 Malignant neoplasm of unspecified part of unspecified bronchus or lung: Secondary | ICD-10-CM | POA: Diagnosis present

## 2016-03-18 DIAGNOSIS — C7931 Secondary malignant neoplasm of brain: Secondary | ICD-10-CM | POA: Diagnosis not present

## 2016-03-18 DIAGNOSIS — C7951 Secondary malignant neoplasm of bone: Secondary | ICD-10-CM

## 2016-03-18 DIAGNOSIS — Z5111 Encounter for antineoplastic chemotherapy: Secondary | ICD-10-CM

## 2016-03-18 DIAGNOSIS — Z85038 Personal history of other malignant neoplasm of large intestine: Secondary | ICD-10-CM

## 2016-03-18 HISTORY — DX: Encounter for antineoplastic chemotherapy: Z51.11

## 2016-03-18 MED ORDER — PROCHLORPERAZINE MALEATE 10 MG PO TABS: 10.0000 mg | ORAL_TABLET | Freq: Four times a day (QID) | ORAL | Status: DC | PRN

## 2016-03-18 MED ORDER — OXYCODONE HCL 5 MG PO TABS: 5.0000 mg | ORAL_TABLET | Freq: Four times a day (QID) | ORAL | Status: DC | PRN

## 2016-03-18 NOTE — Telephone Encounter (Signed)
Oncology Nurse Navigator Documentation  Oncology Nurse Navigator Flowsheets 03/18/2016  Navigator Location [facility]  Navigator Encounter Type Telephone  Telephone Outgoing Call  Treatment Phase Pre-Tx/Tx Discussion  Barriers/Navigation Needs Coordination of Care  Interventions Coordination of Care  Coordination of Care Appts  Acuity Level 2  Acuity Level 2 Assistance expediting appointments  Time Spent with Patient 30   I called and spoke with [name] [name] today.  I was coordinating appt with Dr. Julien Nordmann.  She stated patient is having lack of energy and not eating well.  I updated Dr. Julien Nordmann.  He would like to see patient today.  I called [name] [name] back and gave her an appt time of 3:45 today.  She was thankful for the help.

## 2016-03-18 NOTE — Telephone Encounter (Signed)
Velva Harman called to speak with Jim Like and her mailbox was full. Called Rita back and Hinton Dyer had already called her. The pt will be seeing Dr Julien Nordmann today.

## 2016-03-18 NOTE — Telephone Encounter (Signed)
Gave pt cal & avs °

## 2016-03-18 NOTE — Progress Notes (Signed)
[facility] [phone]) [phone]   Fax:([phone]) [phone]  OFFICE PROGRESS NOTE  [name], MD [place] [address]  DIAGNOSIS:  1) Stage IV (T3, T3, M1b) non-small cell lung cancer, squamous cell carcinoma (PDL1 expression 40%), presented with large left lower lobe lung mass in addition to pulmonary nodule in the right upper lobe as well as multiple solid and cavitary nodules bilaterally in addition to mediastinal, right supraclavicular and right axillary lymphadenopathy as well as bone and brain metastasis, diagnosed in June 2017. PDL 1 expression is a still pending.  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 (Pathologic T3, N0, M0). 1.5 cm primary. No vascular or lymphatic invasion.  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 4.2 cm cavitary right middle lobe lesion with associated right hilar adenopathy which was PET avid.   PRIOR THERAPY: 1) status post right hemicolectomy 06/09/2002. (Pathologic T3, N0, M0). 1.5 cm primary. No vascular or lymphatic invasion followed by  adjuvant chemotherapy with 6 cycles of 5-fluorouracil plus leucovorin. 2) status post right middle lobe resection by Dr. Arlyce Dice on 08/20/2007 following neoadjuvant chemoradiation. He received weekly carboplatin and Taxol during radiation. Radiation given between August 4 and 06/08/2007. 36 gray to the primary tumor. Boost to 45 gray. 2 additional soft tissue deposits in the right middle lobe satellite lesions versus second primary also treated with a final total tumor dose of 65 gray.  CURRENT THERAPY: Systemic chemotherapy with carboplatin for AUC of 5 on day 1 and Abraxane 100 MG/M2 on days 1 and 8 every 3 weeks. First dose  03/25/2016.  INTERVAL HISTORY: BAYLEE MCCORKEL 70 y.o. male returns to the clinic today for followup visit accompanied by his wife. The patient continues to complain of central chest pain as well as shortness of breath with exertion and increasing fatigue and weakness. She was seen recently by Dr. Tammi Klippel for consideration of stereotactic radiotherapy to the solitary brain metastasis. He is scheduled to have this procedure performed next week. He denied having any significant nausea or vomiting. He has no weight loss or night sweats. The patient continues to have the chest pain with shortness of breath but no significant hemoptysis. He is here today for evaluation and discussion of his treatment options.  MEDICAL HISTORY: Past Medical History  Diagnosis Date  . Cancer ([facility])     lung/colon ca  . Colon cancer ([facility]) 01/24/2013    T3N0M0  right colon  R hemicolectomy   . Cancer of middle lobe of lung ([facility]) 01/24/2013    4.5 cm RML w associated R hilar adenopathy S/P chemo/RT then Surgery 8/08  . BPH (benign prostatic hyperplasia) 01/24/2013  . GERD (gastroesophageal reflux disease)   . Raynauds phenomenon   . Rheumatoid arthritis(714.0) 01/24/2013    Since age 31  . Collagen vascular disease ([facility])     RA  . PONV (postoperative nausea and vomiting)     adrenal insufficiency; needs steriod prior to procedures  . Adrenal insufficiency ([facility])   . Lung mass   . HOH (hard of hearing)   . Headache   . Chronic back pain   . Brain cancer ([facility])     4 mm solitary brain metastasis  . Bone cancer ([facility])     L5 bone metastasis  ALLERGIES:  is allergic to aspirin; diphenhydramine hcl; and remicade.  MEDICATIONS:  Current Outpatient Prescriptions  Medication Sig Dispense Refill  . acetaminophen (TYLENOL) 500 MG tablet Take 1,500 mg by mouth every 6 (six) hours as needed (For pain.). Reported on 03/06/2016    . amLODipine (NORVASC) 2.5 MG tablet Take 2.5 mg by mouth daily with lunch.     . Calcium  Carbonate-Vitamin D (CALCIUM + D PO) Take 1 tablet by mouth daily.     Marland Kitchen leflunomide (ARAVA) 10 MG tablet Take 10 mg by mouth daily.     Marland Kitchen LORazepam (ATIVAN) 0.5 MG tablet Take one tab po 30 minutes prior to MRI scans or radiation treatment 30 tablet 0  . omeprazole (PRILOSEC) 20 MG capsule Take 20 mg by mouth daily.     . predniSONE (DELTASONE) 5 MG tablet Take 5 mg by mouth daily with breakfast.    . vitamin C (ASCORBIC ACID) 500 MG tablet Take 500 mg by mouth daily.    . vitamin E 400 UNIT capsule Take 400 Units by mouth daily.     No current facility-administered medications for this visit.    SURGICAL HISTORY:  Past Surgical History  Procedure Laterality Date  . Gastrectomy  1974    bleeding ulcers  . Lumbar disc surgery  2003  . Lung lobectomy  2007    right  . Tendons ruptured  2010    right-hand  . Colectomy  2004    right-hemi  . Appendectomy    . Inguinal hernia repair Right 06/03/2014    Procedure: HERNIA REPAIR INGUINAL ADULT;  Surgeon: Kaylyn Lim, MD;  Location: [facility];  Service: General;  Laterality: Right;  . Transurethral resection of prostate N/A 10/19/2015    Procedure: TRANSURETHRAL RESECTION OF THE PROSTATE (TURP);  Surgeon: Carolan Clines, MD;  Location: [facility];  Service: Urology;  Laterality: N/A;  . Cystoscopy N/A 10/19/2015    Procedure: CYSTOSCOPY;  Surgeon: Carolan Clines, MD;  Location: [facility];  Service: Urology;  Laterality: N/A;  . Video bronchoscopy with endobronchial navigation N/A 03/04/2016    Procedure: VIDEO BRONCHOSCOPY WITH BRUSHINGS AND BIOPSY;  Surgeon: Grace Isaac, MD;  Location: [facility];  Service: Thoracic;  Laterality: N/A;    REVIEW OF SYSTEMS:  Constitutional: positive for fatigue Eyes: negative Ears, nose, mouth, throat, and face: negative Respiratory: positive for cough, dyspnea on exertion, pleurisy/chest pain and sputum Cardiovascular: negative Gastrointestinal:  negative Genitourinary:negative Integument/breast: negative Hematologic/lymphatic: negative Musculoskeletal:positive for back pain Neurological: negative Behavioral/Psych: negative Endocrine: negative Allergic/Immunologic: negative   PHYSICAL EXAMINATION: General appearance: alert, cooperative and no distress Head: Normocephalic, without obvious abnormality, atraumatic Neck: no adenopathy, no JVD, supple, symmetrical, trachea midline and thyroid not enlarged, symmetric, no tenderness/mass/nodules Lymph nodes: Cervical, supraclavicular, and axillary nodes normal. Resp: clear to auscultation bilaterally Back: symmetric, no curvature. ROM normal. No CVA tenderness. Cardio: regular rate and rhythm, S1, S2 normal, no murmur, click, rub or gallop GI: soft, non-tender; bowel sounds normal; no masses,  no organomegaly Extremities: extremities normal, atraumatic, no cyanosis or edema Neurologic: Alert and oriented X 3, normal strength and tone. Normal symmetric reflexes. Normal coordination and gait  ECOG PERFORMANCE STATUS: 1 - Symptomatic but completely ambulatory  Blood pressure 154/94, pulse 106, temperature 98.1 F (36.7 C), temperature source Oral, resp. rate 18, height 5' 9" (1.753 m), weight 165 lb 11.2 oz (75.161 kg), SpO2 98 %.  LABORATORY DATA: Lab Results  Component Value Date   WBC 8.4 03/06/2016  HGB 11.3* 03/06/2016   HCT 34.3* 03/06/2016   MCV 82.3 03/06/2016   PLT 206 03/06/2016      Chemistry      Component Value Date/Time   NA 136 03/06/2016 1400   NA 138 03/04/2016 1147   NA 138 02/21/2012 1241   K 3.7 03/06/2016 1400   K 3.5 03/04/2016 1147   K 4.5 02/21/2012 1241   CL 103 03/04/2016 1147   CL 98 01/15/2013 1454   CL 97* 02/21/2012 1241   CO2 28 03/06/2016 1400   CO2 25 03/04/2016 1147   CO2 30 02/21/2012 1241   BUN 8.2 03/06/2016 1400   BUN 8 03/04/2016 1147   BUN 5* 02/21/2012 1241   CREATININE 0.8 03/06/2016 1400   CREATININE 0.81 03/04/2016 1147    CREATININE 1.0 02/21/2012 1241      Component Value Date/Time   CALCIUM 9.2 03/06/2016 1400   CALCIUM 9.3 03/04/2016 1147   CALCIUM 9.1 02/21/2012 1241   ALKPHOS 84 03/06/2016 1400   ALKPHOS 76 03/04/2016 1147   ALKPHOS 50 02/20/2011 1308   AST 22 03/06/2016 1400   AST 16 03/04/2016 1147   AST 21 02/20/2011 1308   ALT 20 03/06/2016 1400   ALT 14* 03/04/2016 1147   ALT 20 02/20/2011 1308   BILITOT 0.65 03/06/2016 1400   BILITOT 1.0 03/04/2016 1147   BILITOT 1.40 02/20/2011 1308       RADIOGRAPHIC STUDIES: Dg Chest 2 View  03/04/2016  CLINICAL DATA:  Right bronchoscopy for lung mass. EXAM: CHEST  2 VIEW COMPARISON:  PET of 03/01/2016.  Chest radiograph 01/24/2016. FINDINGS: Midline trachea. Mild cardiomegaly. No pleural effusion or pneumothorax. Right perihilar radiation change. Right apical pulmonary nodule. Left lower lobe lung mass is relatively subtle. No consolidation or other acute superimposed finding. IMPRESSION: Bilateral lung lesions, as before. No evidence of pneumonia, pneumothorax, or other acute superimposed process. Electronically Signed   By: Abigail Miyamoto M.D.   On: 03/04/2016 12:18   Mr Jeri Cos ZO Contrast  03/01/2016  CLINICAL DATA:  History of lung and colon cancer. Evaluation for metastatic disease. EXAM: MRI HEAD WITHOUT AND WITH CONTRAST TECHNIQUE: Multiplanar, multiecho pulse sequences of the brain and surrounding structures were obtained without and with intravenous contrast. CONTRAST:  104m MULTIHANCE GADOBENATE DIMEGLUMINE 529 MG/ML IV SOLN COMPARISON:  Contrast-enhanced head CT 01/20/2009. Brain MRI 04/13/2007. FINDINGS: The study is mildly motion degraded throughout. There is no evidence of acute infarct, intracranial hemorrhage, midline shift, or extra-axial fluid collection. Ventricles and sulci are normal for age. Patchy T2 hyperintensities throughout the cerebral white matter bilaterally have progressed from the prior MRI and are nonspecific but may reflect  a combination of chronic small vessel ischemic disease and posttherapy change. Mild T2 signal changes are present in the pons. There is a new chronic lacunar infarct at the lateral margin of the left thalamus. There is a new 4 mm enhancing lesion in the left middle frontal gyrus (series 11, image 47) without significant edema. No other enhancing brain lesions are identified within limitations of motion artifact. No suspicious skull lesions are identified. Orbits are unremarkable. There is a right maxillary sinus mucous retention cyst. Mastoid air cells are clear. Major intracranial vascular flow voids are preserved, with the left vertebral artery again seen to be dominant. IMPRESSION: 1. 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 2. Progressive cerebral white matter disease. Electronically Signed   By: ALogan BoresM.D.   On: 03/01/2016 17:23  Nm Pet Image Restag (ps) Skull Base To Thigh  03/01/2016  CLINICAL DATA:  Subsequent Treatment strategy for lung cancer. EXAM: NUCLEAR MEDICINE PET SKULL BASE TO THIGH TECHNIQUE: 9.3 mCi F-18 FDG was injected intravenously. Full-ring PET imaging was performed from the skull base to thigh after the radiotracer. CT data was obtained and used for attenuation correction and anatomic localization. FASTING BLOOD GLUCOSE:  Value: 110 mg/dl COMPARISON:  08/23/2010 FINDINGS: NECK No hypermetabolic lymph nodes in the neck. CHEST Right axillary node measures 7 mm and has an SUV max equal to 4.9. Within the right supraclavicular region there is a node which measures 7 mm and has an SUV max equal to 5.3, image 53 of series 4. Low right paratracheal lymph node has a short axis measuring 1 cm and has an SUV max equal to 9.4. The sub- carinal node measures 11 mm and has an SUV max equal to 13.28, image 83 of series 4. Left lower lobe, partially necrotic mass measures 5.2 x 5.0 cm and has an SUV max equal to 15.28. In the right upper lobe there is a  pulmonary nodule which measures 2.2 x 1.3 cm, image 61 of series 4. This has an SUV max equal to 18.26. No pleural fluid. Multiple small solid and cavitary nodules are scattered throughout both lungs. Solid nodule within the right upper lobe measures 7 mm, image 69 of series 4. Too small to characterize by PET-CT. Cavitary nodule within the lateral right lung base measures 7 mm, image 106 of series 4. This is too small to reliably characterize. Cavitary nodule within the left base posteriorly measures 6 mm, image 103 of series 4. Also too small to reliably characterize by PET-CT. ABDOMEN/PELVIS No abnormal uptake identified within the liver, pancreas, spleen or adrenal glands. No hypermetabolic abdominal or pelvic adenopathy. SKELETON Within the right posterior aspect of the L5 vertebral body there is a small focus of mild increased FDG uptake within SUV max equal to 3.7. New from previous exam an equivocal for bone metastases. IMPRESSION: 1. Examination is positive for multifocal hypermetabolic tumor within the chest. 2. Pulmonary nodule in the right upper lobe and necrotic mass within the left lower lobe are both hypermetabolic and worrisome for recurrent tumor and/or metastatic disease. Additionally, there are multiple solid and cavitary nodules measuring less than 1 cm identified within both lungs. These are too small to characterize by PET-CT but are suspicious for metastases. 3. Hypermetabolic right axillary, right supraclavicular and mediastinal lymph nodes compatible with metastatic adenopathy. 4. Mild increased uptake within the posterior aspect of the L5 vertebral body is identified. No definite corresponding lytic or destructive bone lesion identified on the CT images. This is equivocal for bone metastases. Electronically Signed   By: Kerby Moors M.D.   On: 03/01/2016 16:13   Dg Epidural/nerve Root  02/27/2016  CLINICAL DATA:  Lumbosacral spondylosis without myelopathy. Partial improvement after the  previous injection. Extruded disc fragment at L4-5 on the LEFT. Severe LEFT leg pain has recurred. EXAM: EPIDURAL/NERVE ROOT FLUOROSCOPY TIME:  20 seconds corresponding to a Dose Area Product of 49.53 Gy*m2 PROCEDURE: The procedure, risks, benefits, and alternatives were explained to the patient. Questions regarding the procedure were encouraged and answered. The patient understands and consents to the procedure. LEFT L5 NERVE ROOT BLOCK AND TRANSFORAMINAL EPIDURAL: A posterior oblique approach was taken to the intervertebral foramen on the LEFT at L5-S1 using a curved 22 gauge spinal needle. Injection of Isovue-M 200 outlined the LEFT L5 nerve root  and showed good cephalad epidural spread to the L4-5 disc space. No vascular opacification is seen. 120.0 mg of Depo-Medrol mixed with 2 mL 1% lidocaine were instilled. The procedure was well-tolerated, and the patient was discharged thirty minutes following the injection in good condition. COMPLICATIONS: None IMPRESSION: Technically successful injection consisting of a LEFT L5 nerve root block and transforaminal epidural. Electronically Signed   By: Staci Righter M.D.   On: 02/27/2016 11:09   Dg Esophagus W/water Sol Cm  02/29/2016  CLINICAL DATA:  Mediastinal mass, evaluate for transesophageal fistula EXAM: ESOPHOGRAM/BARIUM SWALLOW TECHNIQUE: Single contrast examination was performed using water-soluble contrast. FLUOROSCOPY TIME:  Radiation Exposure Index (as provided by the fluoroscopic device): 158 deciGy per square cm If the device does not provide the exposure index: Fluoroscopy Time:  1 minutes 24 seconds Number of Acquired Images: COMPARISON:  CT of the chest of 02/14/2016 FINDINGS: In view of the question of possible transesophageal fistula, water-soluble contrast was utilized. Rapid sequence spot films of the cervical esophagus show a normal swallowing mechanism. Rapid sequence spot films in the frontal projection show no extravasation with free passage  of the water-soluble contrast into the stomach. Additional oblique views were obtained as well as RPO images showing no evidence of esophageal perforation. No fistula is noted. There is perhaps slight extrinsic indentation upon the esophagus immediately sub carinal but again no fistulous communication is evident. IMPRESSION: No extravasation or fistulous communication is seen associated with the esophagus. There is mild extrinsic indentation upon the sub carinal esophagus. Water-soluble contrast flows freely into the stomach. Electronically Signed   By: Ivar Drape M.D.   On: 02/29/2016 08:39   ASSESSMENT AND PLAN: This is a very pleasant 70 years old white male with  1) history of stage II colon adenocarcinoma diagnosed in 2003   2) history stage III non-small cell lung cancer diagnosed in 2008 status post dissection as well as adjuvant therapy. He has been observation since that time with no significant evidence for disease recurrence.  3) recurrent and metastatic ( stage IV) non-small cell lung cancer, squamous cell carcinoma, PDL1 expression 40%, diagnosed in June 2017 when the patient presented for routine annual exam and his blood work showed elevation of CEA up to 11.3. Last year it was 4.1.  I ordered repeat CT scan of the chest, abdomen and pelvis which was performed a few days ago and unfortunately it showed multiple new pulmonary nodules and masses concerning for either metastatic disease to the lung or multiple new synchronous primaries. There was also what appears to be an erosion through the inferior wall of the proximal left mainstem bronchus with a small amount of gas in the middle mediastinum and the subcarinal region. There was also evidence of subcarinal and lower right paratracheal lymphadenopathy. The scan also showed fat containing lesion in the distal descending colon questionable for colonic lipoma or lipomatous polyp. This was followed by a PET scan which showed hypermetabolic  activity in the left lower lobe lung mass in addition to the right upper lobe pulmonary nodule as well as multiple solid and cavitary bilateral pulmonary nodules and suspicious L5 bone metastasis. 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 The patient underwent bronchoscopy under the care of Dr. Servando Snare and the final pathology is consistent with squamous cell carcinoma. He is scheduled for stereotactic radiotherapy to the solitary brain lesion under the care of Dr. Tammi Klippel next week.   had a lengthy discussion with the patient and  his wife today about his current disease status and treatment options. I agree with the patient the option of palliative care versus palliative systemic chemotherapy with carboplatin for AUC of 5 on day 1 and Abraxane 100 MG/M2 on days 1 and 8 every 3 weeks. The patient is interested in proceeding with systemic chemotherapy. I discussed with him the adverse effect of this treatment including but not limited to alopecia, myelosuppression, nausea and vomiting, peripheral neuropathy, liver or renal dysfunction.  He is expected to start the first dose of this treatment on 03/25/2016.  I will arrange for the patient to have a chemotherapy education class before his dose of chemotherapy.  The patient would come back for follow-up visit in 4 weeks for evaluation before starting cycle #2.  For the chest pain management, I started the patient on oxycodone 5 mg by mouth every 6 hours as needed.  I also called his pharmacy with prescription for Compazine 10 mg by mouth every 6 hours as needed for nausea.  The patient and his wife agreed to the current plan. He was advised to call immediately if he has any concerning symptoms in the interval. The patient voices understanding of current disease status and treatment options and is in agreement with the current care plan.  All questions were answered. The patient knows to call the clinic with any problems,  questions or concerns. We can certainly see the patient much sooner if necessary.  Disclaimer: This note was dictated with voice recognition software. Similar sounding words can inadvertently be transcribed and may not be corrected upon review.

## 2016-03-21 ENCOUNTER — Ambulatory Visit
Admission: RE | Admit: 2016-03-21 | Discharge: 2016-03-21 | Disposition: A | Discharge status: Home / Self Care | Payer: Medicare Other | Source: Ambulatory Visit | Attending: Radiation Oncology | Admitting: Radiation Oncology

## 2016-03-21 DIAGNOSIS — C7949 Secondary malignant neoplasm of other parts of nervous system: Principal | ICD-10-CM

## 2016-03-21 DIAGNOSIS — C7931 Secondary malignant neoplasm of brain: Secondary | ICD-10-CM

## 2016-03-21 MED ORDER — GADOBENATE DIMEGLUMINE 529 MG/ML IV SOLN
15.0000 mL | Freq: Once | INTRAVENOUS | Status: AC | PRN
  Administered 2016-03-21: 15 mL via INTRAVENOUS

## 2016-03-23 ENCOUNTER — Other Ambulatory Visit: Payer: Medicare Other

## 2016-03-25 ENCOUNTER — Ambulatory Visit
Admission: RE | Admit: 2016-03-25 | Discharge: 2016-03-25 | Disposition: A | Discharge status: Home / Self Care | Payer: Medicare Other | Source: Ambulatory Visit | Attending: Radiation Oncology | Admitting: Radiation Oncology

## 2016-03-25 ENCOUNTER — Other Ambulatory Visit: Payer: Medicare Other

## 2016-03-25 VITALS — BP 134/84 | HR 101 | Temp 97.8°F | Resp 18 | Ht 69.0 in | Wt 165.1 lb

## 2016-03-25 DIAGNOSIS — Z510 Encounter for antineoplastic radiation therapy: Secondary | ICD-10-CM | POA: Diagnosis not present

## 2016-03-25 DIAGNOSIS — C7931 Secondary malignant neoplasm of brain: Secondary | ICD-10-CM

## 2016-03-25 MED ORDER — SODIUM CHLORIDE 0.9% FLUSH
10.0000 mL | Freq: Once | INTRAVENOUS | Status: AC
Start: 2016-03-25 — End: 2016-03-25
  Administered 2016-03-25: 10 mL via INTRAVENOUS

## 2016-03-25 NOTE — Progress Notes (Signed)
Does patient have an allergy to IV contrast dye?: No   Has patient ever received premedication for IV contrast dye?: No.   Does patient take metformin?: No.  If patient does take metformin when was the last dose: n/a  Date of lab work:  2 week ago BUN: 8.2 CR: 0.8  IV site: right AC 22 gauge   BP 134/84 mmHg  Pulse 101  Temp(Src) 97.8 F (36.6 C) (Oral)  Resp 18  Ht '5\' 9"'$  (1.753 m)  Wt 165 lb 1.6 oz (74.889 kg)  BMI 24.37 kg/m2  SpO2 100%

## 2016-03-25 NOTE — Progress Notes (Signed)
  Radiation Oncology         [phone] ________________________________  Name: [name] [name] MRN: [id number]  Date: 03/25/2016  DOB: 08-15-1946  SIMULATION AND TREATMENT PLANNING NOTE    ICD-9-CM ICD-10-CM   1. Metastasis to brain ([place]) 198.3 C79.31     DIAGNOSIS:  70 yo man with a solitary 5 mm left frontal brain metastasis.  NARRATIVE:  The patient was brought to the [facility].  Identity was confirmed.  All relevant records and images related to the planned course of therapy were reviewed.  The patient freely provided informed written consent to proceed with treatment after reviewing the details related to the planned course of therapy. The consent form was witnessed and verified by the simulation staff. Intravenous access was established for contrast administration. Then, the patient was set-up in a stable reproducible supine position for radiation therapy.  A relocatable thermoplastic stereotactic head frame was fabricated for precise immobilization.  CT images were obtained.  Surface markings were placed.  The CT images were loaded into the planning software and fused with the patient's targeting MRI scan.  Then the target and avoidance structures were contoured.  Treatment planning then occurred.  The radiation prescription was entered and confirmed.  I have requested 3D planning  I have requested a DVH of the following structures: Brain stem, brain, left eye, right eye, lenses, optic chiasm, target volumes, uninvolved brain, and normal tissue.    SPECIAL TREATMENT PROCEDURE:  The planned course of therapy using radiation constitutes a special treatment procedure. Special care is required in the management of this patient for the following reasons. This treatment constitutes a Special Treatment Procedure for the following reason: High dose per fraction requiring special monitoring for increased toxicities of treatment including daily imaging.  The special nature of the  planned course of radiotherapy will require increased physician supervision and oversight to ensure patient's safety with optimal treatment outcomes.  PLAN:  The patient will receive 20 Gy in 1 fraction.  ________________________________  Sheral Apley Tammi Klippel, M.D.    This document serves as a record of services personally performed by [name] Pita, MD. It was created on his behalf by Lendon Collar, a trained medical scribe. The creation of this record is based on the scribe's personal observations and the provider's statements to them. This document has been checked and approved by the attending provider.

## 2016-03-25 NOTE — Progress Notes (Signed)
Mr. [name] [name] here today for simulation with IV contrast.  VSS.    He denies any negative reactions to IV contrast with prior CT Scans.

## 2016-03-26 ENCOUNTER — Other Ambulatory Visit: Payer: Self-pay | Admitting: Medical Oncology

## 2016-03-26 ENCOUNTER — Ambulatory Visit (HOSPITAL_BASED_OUTPATIENT_CLINIC_OR_DEPARTMENT_OTHER): Payer: Medicare Other

## 2016-03-26 ENCOUNTER — Other Ambulatory Visit (HOSPITAL_BASED_OUTPATIENT_CLINIC_OR_DEPARTMENT_OTHER): Payer: Medicare Other

## 2016-03-26 ENCOUNTER — Other Ambulatory Visit: Payer: Medicare Other

## 2016-03-26 ENCOUNTER — Encounter: Payer: Self-pay | Admitting: *Deleted

## 2016-03-26 DIAGNOSIS — C3490 Malignant neoplasm of unspecified part of unspecified bronchus or lung: Secondary | ICD-10-CM | POA: Diagnosis present

## 2016-03-26 DIAGNOSIS — C3492 Malignant neoplasm of unspecified part of left bronchus or lung: Secondary | ICD-10-CM

## 2016-03-26 DIAGNOSIS — Z510 Encounter for antineoplastic radiation therapy: Secondary | ICD-10-CM | POA: Diagnosis not present

## 2016-03-26 DIAGNOSIS — Z5111 Encounter for antineoplastic chemotherapy: Secondary | ICD-10-CM | POA: Diagnosis present

## 2016-03-26 DIAGNOSIS — C7931 Secondary malignant neoplasm of brain: Secondary | ICD-10-CM

## 2016-03-26 DIAGNOSIS — C7951 Secondary malignant neoplasm of bone: Secondary | ICD-10-CM | POA: Diagnosis not present

## 2016-03-26 LAB — COMPREHENSIVE METABOLIC PANEL
ALK PHOS: 102 U/L (ref 40–150)
ALT: 15 U/L (ref 0–55)
ANION GAP: 11 meq/L (ref 3–11)
AST: 14 U/L (ref 5–34)
Albumin: 2.9 g/dL — ABNORMAL LOW (ref 3.5–5.0)
BILIRUBIN TOTAL: 0.54 mg/dL (ref 0.20–1.20)
BUN: 7.5 mg/dL (ref 7.0–26.0)
CO2: 23 meq/L (ref 22–29)
Calcium: 9.4 mg/dL (ref 8.4–10.4)
Chloride: 100 mEq/L (ref 98–109)
Creatinine: 0.8 mg/dL (ref 0.7–1.3)
EGFR: 89 mL/min/{1.73_m2} — AB (ref >90)
Glucose: 104 mg/dl (ref 70–140)
Potassium: 3.6 mEq/L (ref 3.5–5.1)
Sodium: 134 mEq/L — ABNORMAL LOW (ref 136–145)
TOTAL PROTEIN: 6.8 g/dL (ref 6.4–8.3)

## 2016-03-26 LAB — CBC WITH DIFFERENTIAL/PLATELET
BASO%: 0.8 % (ref 0.0–2.0)
Basophils Absolute: 0.1 10*3/uL (ref 0.0–0.1)
EOS ABS: 0.1 10*3/uL (ref 0.0–0.5)
EOS%: 0.8 % (ref 0.0–7.0)
HCT: 36.3 % — ABNORMAL LOW (ref 38.4–49.9)
HGB: 11.9 g/dL — ABNORMAL LOW (ref 13.0–17.1)
LYMPH%: 10.2 % — AB (ref 14.0–49.0)
MCH: 27.6 pg (ref 27.2–33.4)
MCHC: 32.8 g/dL (ref 32.0–36.0)
MCV: 84.3 fL (ref 79.3–98.0)
MONO#: 0.6 10*3/uL (ref 0.1–0.9)
MONO%: 8.3 % (ref 0.0–14.0)
NEUT%: 79.9 % — AB (ref 39.0–75.0)
NEUTROS ABS: 5.5 10*3/uL (ref 1.5–6.5)
PLATELETS: 202 10*3/uL (ref 140–400)
RBC: 4.3 10*6/uL (ref 4.20–5.82)
RDW: 21.7 % — ABNORMAL HIGH (ref 11.0–14.6)
WBC: 6.9 10*3/uL (ref 4.0–10.3)
lymph#: 0.7 10*3/uL — ABNORMAL LOW (ref 0.9–3.3)

## 2016-03-26 MED ORDER — SODIUM CHLORIDE 0.9 % IV SOLN
490.5000 mg | Freq: Once | INTRAVENOUS | Status: AC
  Administered 2016-03-26: 490 mg via INTRAVENOUS
  Filled 2016-03-26: qty 49

## 2016-03-26 MED ORDER — PACLITAXEL PROTEIN-BOUND CHEMO INJECTION 100 MG
100.0000 mg/m2 | Freq: Once | INTRAVENOUS | Status: AC
  Administered 2016-03-26: 200 mg via INTRAVENOUS
  Filled 2016-03-26: qty 40

## 2016-03-26 MED ORDER — SODIUM CHLORIDE 0.9 % IV SOLN
Freq: Once | INTRAVENOUS | Status: AC
  Administered 2016-03-26: 14:00:00 via INTRAVENOUS

## 2016-03-26 MED ORDER — PALONOSETRON HCL INJECTION 0.25 MG/5ML
INTRAVENOUS | Status: AC
  Filled 2016-03-26: qty 5

## 2016-03-26 MED ORDER — DEXAMETHASONE SODIUM PHOSPHATE 100 MG/10ML IJ SOLN
10.0000 mg | Freq: Once | INTRAMUSCULAR | Status: AC
  Administered 2016-03-26: 10 mg via INTRAVENOUS
  Filled 2016-03-26: qty 1

## 2016-03-26 MED ORDER — PALONOSETRON HCL INJECTION 0.25 MG/5ML
0.2500 mg | Freq: Once | INTRAVENOUS | Status: AC
  Administered 2016-03-26: 0.25 mg via INTRAVENOUS

## 2016-03-26 NOTE — Patient Instructions (Addendum)
[facility] Discharge Instructions for Patients Receiving Chemotherapy  Today you received the following chemotherapy agents Abraxane/Carboplatin  To help prevent nausea and vomiting after your treatment, we encourage you to take your nausea medication    If you develop nausea and vomiting that is not controlled by your nausea medication, call the clinic.   BELOW ARE SYMPTOMS THAT SHOULD BE REPORTED IMMEDIATELY:  *FEVER GREATER THAN 100.5 F  *CHILLS WITH OR WITHOUT FEVER  NAUSEA AND VOMITING THAT IS NOT CONTROLLED WITH YOUR NAUSEA MEDICATION  *UNUSUAL SHORTNESS OF BREATH  *UNUSUAL BRUISING OR BLEEDING  TENDERNESS IN MOUTH AND THROAT WITH OR WITHOUT PRESENCE OF ULCERS  *URINARY PROBLEMS  *BOWEL PROBLEMS  UNUSUAL RASH Items with * indicate a potential emergency and should be followed up as soon as possible.  Feel free to call the clinic you have any questions or concerns. The clinic phone number is [phone].  Please show the [facility] at check-in to the Emergency Department and triage nurse.  Nanoparticle Albumin-Bound Paclitaxel injection What is this medicine? NANOPARTICLE ALBUMIN-BOUND PACLITAXEL (Na no PAHR ti kuhl al BYOO muhn-bound PAK li TAX el) is a chemotherapy drug. It targets fast dividing cells, like cancer cells, and causes these cells to die. This medicine is used to treat advanced breast cancer and advanced lung cancer. This medicine may be used for other purposes; ask your health care provider or pharmacist if you have questions. What should I tell my health care provider before I take this medicine? They need to know if you have any of these conditions: -kidney disease -liver disease -low blood counts, like low platelets, red blood cells, or white blood cells -recent or ongoing radiation therapy -an unusual or allergic reaction to paclitaxel, albumin, other chemotherapy, other medicines, foods, dyes, or preservatives -pregnant or  trying to get pregnant -breast-feeding How should I use this medicine? This drug is given as an infusion into a vein. It is administered in a hospital or clinic by a specially trained health care professional. Talk to your pediatrician regarding the use of this medicine in children. Special care may be needed. Overdosage: If you think you have taken too much of this medicine contact a poison control center or emergency room at once. NOTE: This medicine is only for you. Do not share this medicine with others. What if I miss a dose? It is important not to miss your dose. Call your doctor or health care professional if you are unable to keep an appointment. What may interact with this medicine? -cyclosporine -diazepam -ketoconazole -medicines to increase blood counts like filgrastim, pegfilgrastim, sargramostim -other chemotherapy drugs like cisplatin, doxorubicin, epirubicin, etoposide, teniposide, vincristine -quinidine -testosterone -vaccines -verapamil Talk to your doctor or health care professional before taking any of these medicines: -acetaminophen -aspirin -ibuprofen -ketoprofen -naproxen This list may not describe all possible interactions. Give your health care provider a list of all the medicines, herbs, non-prescription drugs, or dietary supplements you use. Also tell them if you smoke, drink alcohol, or use illegal drugs. Some items may interact with your medicine. What should I watch for while using this medicine? Your condition will be monitored carefully while you are receiving this medicine. You will need important blood work done while you are taking this medicine. This drug may make you feel generally unwell. This is not uncommon, as chemotherapy can affect healthy cells as well as cancer cells. Report any side effects. Continue your course of treatment even though you feel ill unless  your doctor tells you to stop. In some cases, you may be given additional medicines to  help with side effects. Follow all directions for their use. Call your doctor or health care professional for advice if you get a fever, chills or sore throat, or other symptoms of a cold or flu. Do not treat yourself. This drug decreases your body's ability to fight infections. Try to avoid being around people who are sick. This medicine may increase your risk to bruise or bleed. Call your doctor or health care professional if you notice any unusual bleeding. Be careful brushing and flossing your teeth or using a toothpick because you may get an infection or bleed more easily. If you have any dental work done, tell your dentist you are receiving this medicine. Avoid taking products that contain aspirin, acetaminophen, ibuprofen, naproxen, or ketoprofen unless instructed by your doctor. These medicines may hide a fever. Do not become pregnant while taking this medicine. Women should inform their doctor if they wish to become pregnant or think they might be pregnant. There is a potential for serious side effects to an unborn child. Talk to your health care professional or pharmacist for more information. Do not breast-feed an infant while taking this medicine. Men are advised not to father a child while receiving this medicine. What side effects may I notice from receiving this medicine? Side effects that you should report to your doctor or health care professional as soon as possible: -allergic reactions like skin rash, itching or hives, swelling of the face, lips, or tongue -low blood counts - This drug may decrease the number of white blood cells, red blood cells and platelets. You may be at increased risk for infections and bleeding. -signs of infection - fever or chills, cough, sore throat, pain or difficulty passing urine -signs of decreased platelets or bleeding - bruising, pinpoint red spots on the skin, black, tarry stools, nosebleeds -signs of decreased red blood cells - unusually weak or  tired, fainting spells, lightheadedness -breathing problems -changes in vision -chest pain -high or low blood pressure -mouth sores -nausea and vomiting -pain, swelling, redness or irritation at the injection site -pain, tingling, numbness in the hands or feet -slow or irregular heartbeat -swelling of the ankle, feet, hands Side effects that usually do not require medical attention (report to your doctor or health care professional if they continue or are bothersome): -aches, pains -changes in the color of fingernails -diarrhea -hair loss -loss of appetite This list may not describe all possible side effects. Call your doctor for medical advice about side effects. You may report side effects to FDA at 1-800-FDA-1088. Where should I keep my medicine? This drug is given in a hospital or clinic and will not be stored at home. NOTE: This sheet is a summary. It may not cover all possible information. If you have questions about this medicine, talk to your doctor, pharmacist, or health care provider.    2016, Elsevier/Gold Standard. (2012-10-26 16:48:50)    Carboplatin injection What is this medicine? CARBOPLATIN (KAR boe pla tin) is a chemotherapy drug. It targets fast dividing cells, like cancer cells, and causes these cells to die. This medicine is used to treat ovarian cancer and many other cancers. This medicine may be used for other purposes; ask your health care provider or pharmacist if you have questions. What should I tell my health care provider before I take this medicine? They need to know if you have any of these conditions: -blood disorders -  hearing problems -kidney disease -recent or ongoing radiation therapy -an unusual or allergic reaction to carboplatin, cisplatin, other chemotherapy, other medicines, foods, dyes, or preservatives -pregnant or trying to get pregnant -breast-feeding How should I use this medicine? This drug is usually given as an infusion into a  vein. It is administered in a hospital or clinic by a specially trained health care professional. Talk to your pediatrician regarding the use of this medicine in children. Special care may be needed. Overdosage: If you think you have taken too much of this medicine contact a poison control center or emergency room at once. NOTE: This medicine is only for you. Do not share this medicine with others. What if I miss a dose? It is important not to miss a dose. Call your doctor or health care professional if you are unable to keep an appointment. What may interact with this medicine? -medicines for seizures -medicines to increase blood counts like filgrastim, pegfilgrastim, sargramostim -some antibiotics like amikacin, gentamicin, neomycin, streptomycin, tobramycin -vaccines Talk to your doctor or health care professional before taking any of these medicines: -acetaminophen -aspirin -ibuprofen -ketoprofen -naproxen This list may not describe all possible interactions. Give your health care provider a list of all the medicines, herbs, non-prescription drugs, or dietary supplements you use. Also tell them if you smoke, drink alcohol, or use illegal drugs. Some items may interact with your medicine. What should I watch for while using this medicine? Your condition will be monitored carefully while you are receiving this medicine. You will need important blood work done while you are taking this medicine. This drug may make you feel generally unwell. This is not uncommon, as chemotherapy can affect healthy cells as well as cancer cells. Report any side effects. Continue your course of treatment even though you feel ill unless your doctor tells you to stop. In some cases, you may be given additional medicines to help with side effects. Follow all directions for their use. Call your doctor or health care professional for advice if you get a fever, chills or sore throat, or other symptoms of a cold or flu.  Do not treat yourself. This drug decreases your body's ability to fight infections. Try to avoid being around people who are sick. This medicine may increase your risk to bruise or bleed. Call your doctor or health care professional if you notice any unusual bleeding. Be careful brushing and flossing your teeth or using a toothpick because you may get an infection or bleed more easily. If you have any dental work done, tell your dentist you are receiving this medicine. Avoid taking products that contain aspirin, acetaminophen, ibuprofen, naproxen, or ketoprofen unless instructed by your doctor. These medicines may hide a fever. Do not become pregnant while taking this medicine. Women should inform their doctor if they wish to become pregnant or think they might be pregnant. There is a potential for serious side effects to an unborn child. Talk to your health care professional or pharmacist for more information. Do not breast-feed an infant while taking this medicine. What side effects may I notice from receiving this medicine? Side effects that you should report to your doctor or health care professional as soon as possible: -allergic reactions like skin rash, itching or hives, swelling of the face, lips, or tongue -signs of infection - fever or chills, cough, sore throat, pain or difficulty passing urine -signs of decreased platelets or bleeding - bruising, pinpoint red spots on the skin, black, tarry stools, nosebleeds -signs  of decreased red blood cells - unusually weak or tired, fainting spells, lightheadedness -breathing problems -changes in hearing -changes in vision -chest pain -high blood pressure -low blood counts - This drug may decrease the number of white blood cells, red blood cells and platelets. You may be at increased risk for infections and bleeding. -nausea and vomiting -pain, swelling, redness or irritation at the injection site -pain, tingling, numbness in the hands or  feet -problems with balance, talking, walking -trouble passing urine or change in the amount of urine Side effects that usually do not require medical attention (report to your doctor or health care professional if they continue or are bothersome): -hair loss -loss of appetite -metallic taste in the mouth or changes in taste This list may not describe all possible side effects. Call your doctor for medical advice about side effects. You may report side effects to FDA at 1-800-FDA-1088. Where should I keep my medicine? This drug is given in a hospital or clinic and will not be stored at home. NOTE: This sheet is a summary. It may not cover all possible information. If you have questions about this medicine, talk to your doctor, pharmacist, or health care provider.    2016, Elsevier/Gold Standard. (2007-12-08 14:38:05)

## 2016-03-27 ENCOUNTER — Telehealth: Payer: Self-pay | Admitting: Medical Oncology

## 2016-03-27 NOTE — Telephone Encounter (Signed)
-----   Message from Azzie Glatter, RN sent at 03/26/2016  6:39 PM EDT ----- Regarding: "1st time chemotherapy---per Dr. Julien Nordmann" Patient received Abraxane and Carboplatin for the 1st time today, per Dr. Julien Nordmann.  Tolerated treatment well with no complaints.

## 2016-03-27 NOTE — Telephone Encounter (Signed)
Per wife , Pt did fine after chemo . He is sleeping now , woke up at 1 am then back to bed . He has been up and down this am. I encouraged her to make sure he eats and drinks extra fluids.

## 2016-03-29 ENCOUNTER — Encounter: Payer: Self-pay | Admitting: Radiation Oncology

## 2016-03-29 ENCOUNTER — Ambulatory Visit
Admission: RE | Admit: 2016-03-29 | Discharge: 2016-03-29 | Disposition: A | Discharge status: Home / Self Care | Payer: Medicare Other | Source: Ambulatory Visit | Attending: Radiation Oncology | Admitting: Radiation Oncology

## 2016-03-29 VITALS — BP 123/86 | HR 89 | Temp 97.9°F | Resp 16

## 2016-03-29 DIAGNOSIS — C7931 Secondary malignant neoplasm of brain: Secondary | ICD-10-CM

## 2016-03-29 DIAGNOSIS — Z510 Encounter for antineoplastic radiation therapy: Secondary | ICD-10-CM | POA: Diagnosis not present

## 2016-03-29 NOTE — Progress Notes (Signed)
Nurse monitoring following SRS complete. Wife at bedside. Vitals stable. patient denies pain. Patient denies taking decadron but, does take prednisone 5 mg daily. Patient denies headache, dizziness, nausea, diplopia or ringing in the ears. One month follow up appointment card given. Patient instructed to avoid strenuous activity for the next 24 hours and call [phone] with blinding headache or vomiting. Patient and wife verbalized understanding. Patient discharged home ambulatory with wife.

## 2016-03-29 NOTE — Procedures (Signed)
  Name: [name] [name]  MRN: [id number]  Date: 03/29/2016   DOB: 11/25/1945  Stereotactic Radiosurgery Operative Note  PRE-OPERATIVE DIAGNOSIS:  Solitary Brain Metastasis  POST-OPERATIVE DIAGNOSIS:  Solitary Brain Metastasis  PROCEDURE:  Stereotactic Radiosurgery  SURGEON:  Damari Suastegui P, MD  NARRATIVE: The patient underwent a radiation treatment planning session in the radiation oncology simulation suite under the care of the radiation oncology physician and physicist.  I participated closely in the radiation treatment planning afterwards. The patient underwent planning CT which was fused to 3T high resolution MRI with 1 mm axial slices.  These images were fused on the planning system.  We contoured the gross target volumes and subsequently expanded this to yield the Planning Target Volume. I actively participated in the planning process.  I helped to define and review the target contours and also the contours of the optic pathway, eyes, brainstem and selected nearby organs at risk.  All the dose constraints for critical structures were reviewed and compared to AAPM Task Group 101.  The prescription dose conformity was reviewed.  I approved the plan electronically.    Accordingly, [name] was brought to the TrueBeam stereotactic radiation treatment linac and placed in the custom immobilization mask.  The patient was aligned according to the IR fiducial markers with BrainLab Exactrac, then orthogonal x-rays were used in ExacTrac with the 6DOF robotic table and the shifts were made to align the patient  [name] received stereotactic radiosurgery uneventfully.    The detailed description of the procedure is recorded in the radiation oncology procedure note.  I was present for the duration of the procedure.  DISPOSITION:  Following delivery, the patient was transported to nursing in stable condition and monitored for possible acute effects to be discharged to home in stable  condition with follow-up in one month.  Evelia Waskey P, MD 03/29/2016 8:33 AM

## 2016-03-29 NOTE — Progress Notes (Signed)
  Radiation Oncology         [phone] ________________________________  Stereotactic Treatment Procedure Note  Name: [name] [name] MRN: [id number]  Date: 03/29/2016  DOB: 08-28-1946  SPECIAL TREATMENT PROCEDURE    ICD-9-CM ICD-10-CM   1. Metastasis to brain ([place]) 198.3 C79.31     3D TREATMENT PLANNING AND DOSIMETRY:  The patient's radiation plan was reviewed and approved by neurosurgery and radiation oncology prior to treatment.  It showed 3-dimensional radiation distributions overlaid onto the planning CT/MRI image set.  The [facility] for the target structures as well as the organs at risk were reviewed. The documentation of the 3D plan and dosimetry are filed in the radiation oncology EMR.  NARRATIVE:  [name] [name] was brought to the TrueBeam stereotactic radiation treatment machine and placed supine on the CT couch. The head frame was applied, and the patient was set up for stereotactic radiosurgery.  Neurosurgery was present for the set-up and delivery  SIMULATION VERIFICATION:  In the couch zero-angle position, the patient underwent Exactrac imaging using the Brainlab system with orthogonal KV images.  These were carefully aligned and repeated to confirm treatment position for each of the isocenters.  The Exactrac snap film verification was repeated at each couch angle.  PROCEDURE: [name] [name] received stereotactic radiosurgery to the following targets: Left frontal 5 mm target was treated using 4 Dynamic Conformal Arcs to a prescription dose of 20 Gy.  ExacTrac registration was performed for each couch angle.  The 81.3% isodose line was prescribed.  6 MV X-rays were delivered in the flattening filter free beam mode.  STEREOTACTIC TREATMENT MANAGEMENT:  Following delivery, the patient was transported to nursing in stable condition and monitored for possible acute effects.  Vital signs were recorded BP 123/86 mmHg  Pulse 89  Temp(Src) 97.9 F (36.6 C) (Oral)  Resp 16   SpO2 100%. The patient tolerated treatment without significant acute effects, and was discharged to home in stable condition.    PLAN: Follow-up in one month.  ________________________________  Sheral Apley. Tammi Klippel, M.D.

## 2016-04-01 ENCOUNTER — Other Ambulatory Visit: Payer: Medicare Other

## 2016-04-01 ENCOUNTER — Ambulatory Visit: Payer: Medicare Other

## 2016-04-02 ENCOUNTER — Ambulatory Visit (HOSPITAL_BASED_OUTPATIENT_CLINIC_OR_DEPARTMENT_OTHER): Payer: Medicare Other

## 2016-04-02 ENCOUNTER — Encounter: Payer: Self-pay | Admitting: *Deleted

## 2016-04-02 ENCOUNTER — Other Ambulatory Visit (HOSPITAL_BASED_OUTPATIENT_CLINIC_OR_DEPARTMENT_OTHER): Payer: Medicare Other

## 2016-04-02 VITALS — BP 102/83 | HR 60 | Temp 98.2°F | Resp 16

## 2016-04-02 DIAGNOSIS — C3490 Malignant neoplasm of unspecified part of unspecified bronchus or lung: Secondary | ICD-10-CM

## 2016-04-02 DIAGNOSIS — C7951 Secondary malignant neoplasm of bone: Secondary | ICD-10-CM | POA: Diagnosis not present

## 2016-04-02 DIAGNOSIS — C3492 Malignant neoplasm of unspecified part of left bronchus or lung: Secondary | ICD-10-CM

## 2016-04-02 DIAGNOSIS — Z5111 Encounter for antineoplastic chemotherapy: Secondary | ICD-10-CM

## 2016-04-02 DIAGNOSIS — C7931 Secondary malignant neoplasm of brain: Secondary | ICD-10-CM | POA: Diagnosis not present

## 2016-04-02 LAB — CBC WITH DIFFERENTIAL/PLATELET
BASO%: 0.4 % (ref 0.0–2.0)
Basophils Absolute: 0 10*3/uL (ref 0.0–0.1)
EOS ABS: 0 10*3/uL (ref 0.0–0.5)
EOS%: 1.1 % (ref 0.0–7.0)
HCT: 32.2 % — ABNORMAL LOW (ref 38.4–49.9)
HEMOGLOBIN: 11.1 g/dL — AB (ref 13.0–17.1)
LYMPH%: 22.6 % (ref 14.0–49.0)
MCH: 28.1 pg (ref 27.2–33.4)
MCHC: 34.5 g/dL (ref 32.0–36.0)
MCV: 81.5 fL (ref 79.3–98.0)
MONO#: 0.2 10*3/uL (ref 0.1–0.9)
MONO%: 5.7 % (ref 0.0–14.0)
NEUT%: 70.2 % (ref 39.0–75.0)
NEUTROS ABS: 2 10*3/uL (ref 1.5–6.5)
Platelets: 141 10*3/uL (ref 140–400)
RBC: 3.95 10*6/uL — AB (ref 4.20–5.82)
RDW: 18.9 % — AB (ref 11.0–14.6)
WBC: 2.8 10*3/uL — AB (ref 4.0–10.3)
lymph#: 0.6 10*3/uL — ABNORMAL LOW (ref 0.9–3.3)

## 2016-04-02 LAB — COMPREHENSIVE METABOLIC PANEL
ALBUMIN: 2.8 g/dL — AB (ref 3.5–5.0)
ALT: 33 U/L (ref 0–55)
ANION GAP: 10 meq/L (ref 3–11)
AST: 24 U/L (ref 5–34)
Alkaline Phosphatase: 131 U/L (ref 40–150)
BILIRUBIN TOTAL: 0.91 mg/dL (ref 0.20–1.20)
BUN: 12.7 mg/dL (ref 7.0–26.0)
CO2: 27 meq/L (ref 22–29)
CREATININE: 0.8 mg/dL (ref 0.7–1.3)
Calcium: 9.3 mg/dL (ref 8.4–10.4)
Chloride: 95 mEq/L — ABNORMAL LOW (ref 98–109)
EGFR: 90 mL/min/{1.73_m2} — ABNORMAL LOW (ref >90)
Glucose: 131 mg/dl (ref 70–140)
Potassium: 3.4 mEq/L — ABNORMAL LOW (ref 3.5–5.1)
Sodium: 133 mEq/L — ABNORMAL LOW (ref 136–145)
TOTAL PROTEIN: 6.6 g/dL (ref 6.4–8.3)

## 2016-04-02 MED ORDER — PACLITAXEL PROTEIN-BOUND CHEMO INJECTION 100 MG
100.0000 mg/m2 | Freq: Once | INTRAVENOUS | Status: AC
  Administered 2016-04-02: 200 mg via INTRAVENOUS
  Filled 2016-04-02: qty 40

## 2016-04-02 MED ORDER — PROCHLORPERAZINE MALEATE 10 MG PO TABS
ORAL_TABLET | ORAL | Status: AC
  Filled 2016-04-02: qty 1

## 2016-04-02 MED ORDER — PROCHLORPERAZINE MALEATE 10 MG PO TABS
10.0000 mg | ORAL_TABLET | Freq: Once | ORAL | Status: AC
  Administered 2016-04-02: 10 mg via ORAL

## 2016-04-02 MED ORDER — SODIUM CHLORIDE 0.9 % IV SOLN
Freq: Once | INTRAVENOUS | Status: AC
  Administered 2016-04-02: 15:00:00 via INTRAVENOUS

## 2016-04-02 NOTE — Progress Notes (Signed)
[facility] Social Work  Clinical Social Work was referred by patient to review and complete healthcare advance directives.  Clinical Social Worker met with patient and patients wife in [place] office.  The patient designated [facility] [employer] as their primary healthcare agent.   Clinical Social Worker notarized documents and made copies for patient/family. Clinical Social Worker will send documents to medical records to be scanned into patient's chart. Clinical Social Worker encouraged patient/family to contact with any additional questions or concerns.  Johnnye Lana, MSW, LCSW, OSW-C Clinical Social Worker [facility] [phone]

## 2016-04-05 NOTE — Progress Notes (Signed)
  Radiation Oncology         [phone] ________________________________  Name: [name] [name] MRN: [id number]  Date: 03/29/2016  DOB: Oct 15, 1945  End of Treatment Note  ICD-9-CM ICD-10-CM     1. Metastasis to brain ([place]) 198.3 C79.31     DIAGNOSIS: 70 yo man with a solitary 5 mm left frontal brain metastasis.     Indication for treatment:  Palliation       Radiation treatment dates:   03/29/2016  Site/dose/beams/energy:   Left frontal 5 mm target was treated using 4 Dynamic Conformal Arcs to a prescription dose of 20 Gy.  ExacTrac registration was performed for each couch angle.  The 81.3% isodose line was prescribed.  6 MV X-rays were delivered in the flattening filter free beam mode.  Narrative: The patient tolerated radiation treatment relatively well.     Plan: The patient has completed radiation treatment. The patient will return to radiation oncology clinic for routine followup in one month. I advised him to call or return sooner if he has any questions or concerns related to his recovery or treatment. ________________________________  Sheral Apley. Tammi Klippel, M.D.

## 2016-04-16 ENCOUNTER — Other Ambulatory Visit (HOSPITAL_BASED_OUTPATIENT_CLINIC_OR_DEPARTMENT_OTHER): Payer: Medicare Other

## 2016-04-16 ENCOUNTER — Encounter: Payer: Self-pay | Admitting: Internal Medicine

## 2016-04-16 ENCOUNTER — Ambulatory Visit (HOSPITAL_BASED_OUTPATIENT_CLINIC_OR_DEPARTMENT_OTHER): Payer: Medicare Other | Admitting: Internal Medicine

## 2016-04-16 ENCOUNTER — Ambulatory Visit (HOSPITAL_BASED_OUTPATIENT_CLINIC_OR_DEPARTMENT_OTHER): Payer: Medicare Other

## 2016-04-16 ENCOUNTER — Telehealth: Payer: Self-pay | Admitting: Internal Medicine

## 2016-04-16 VITALS — BP 128/84 | HR 99 | Temp 98.3°F | Resp 18 | Wt 164.0 lb

## 2016-04-16 DIAGNOSIS — Z5111 Encounter for antineoplastic chemotherapy: Secondary | ICD-10-CM

## 2016-04-16 DIAGNOSIS — Z85038 Personal history of other malignant neoplasm of large intestine: Secondary | ICD-10-CM | POA: Diagnosis not present

## 2016-04-16 DIAGNOSIS — C7931 Secondary malignant neoplasm of brain: Secondary | ICD-10-CM | POA: Diagnosis not present

## 2016-04-16 DIAGNOSIS — C3490 Malignant neoplasm of unspecified part of unspecified bronchus or lung: Secondary | ICD-10-CM

## 2016-04-16 DIAGNOSIS — R079 Chest pain, unspecified: Secondary | ICD-10-CM | POA: Diagnosis not present

## 2016-04-16 DIAGNOSIS — C3492 Malignant neoplasm of unspecified part of left bronchus or lung: Secondary | ICD-10-CM

## 2016-04-16 LAB — COMPREHENSIVE METABOLIC PANEL
ALBUMIN: 3 g/dL — AB (ref 3.5–5.0)
ALK PHOS: 91 U/L (ref 40–150)
ALT: 17 U/L (ref 0–55)
ANION GAP: 8 meq/L (ref 3–11)
AST: 15 U/L (ref 5–34)
BUN: 8.6 mg/dL (ref 7.0–26.0)
CALCIUM: 9.2 mg/dL (ref 8.4–10.4)
CHLORIDE: 100 meq/L (ref 98–109)
CO2: 26 mEq/L (ref 22–29)
Creatinine: 0.7 mg/dL (ref 0.7–1.3)
EGFR: >90 mL/min/{1.73_m2} (ref >90)
Glucose: 104 mg/dl (ref 70–140)
POTASSIUM: 3.6 meq/L (ref 3.5–5.1)
Sodium: 134 mEq/L — ABNORMAL LOW (ref 136–145)
Total Bilirubin: 0.44 mg/dL (ref 0.20–1.20)
Total Protein: 6.5 g/dL (ref 6.4–8.3)

## 2016-04-16 LAB — CBC WITH DIFFERENTIAL/PLATELET
BASO%: 1.1 % (ref 0.0–2.0)
BASOS ABS: 0 10*3/uL (ref 0.0–0.1)
EOS ABS: 0 10*3/uL (ref 0.0–0.5)
EOS%: 0.4 % (ref 0.0–7.0)
HEMATOCRIT: 32.9 % — AB (ref 38.4–49.9)
HEMOGLOBIN: 10.9 g/dL — AB (ref 13.0–17.1)
LYMPH#: 0.6 10*3/uL — AB (ref 0.9–3.3)
LYMPH%: 20.6 % (ref 14.0–49.0)
MCH: 28.2 pg (ref 27.2–33.4)
MCHC: 33.3 g/dL (ref 32.0–36.0)
MCV: 84.6 fL (ref 79.3–98.0)
MONO#: 0.5 10*3/uL (ref 0.1–0.9)
MONO%: 14.9 % — ABNORMAL HIGH (ref 0.0–14.0)
NEUT#: 2 10*3/uL (ref 1.5–6.5)
NEUT%: 63 % (ref 39.0–75.0)
PLATELETS: 185 10*3/uL (ref 140–400)
RBC: 3.89 10*6/uL — ABNORMAL LOW (ref 4.20–5.82)
RDW: 21.4 % — AB (ref 11.0–14.6)
WBC: 3.1 10*3/uL — ABNORMAL LOW (ref 4.0–10.3)

## 2016-04-16 LAB — TECHNOLOGIST REVIEW

## 2016-04-16 MED ORDER — PACLITAXEL PROTEIN-BOUND CHEMO INJECTION 100 MG
100.0000 mg/m2 | Freq: Once | INTRAVENOUS | Status: AC
Start: 2016-04-16 — End: 2016-04-16
  Administered 2016-04-16: 200 mg via INTRAVENOUS
  Filled 2016-04-16: qty 40

## 2016-04-16 MED ORDER — PALONOSETRON HCL INJECTION 0.25 MG/5ML
INTRAVENOUS | Status: AC
  Filled 2016-04-16: qty 5

## 2016-04-16 MED ORDER — PALONOSETRON HCL INJECTION 0.25 MG/5ML
0.2500 mg | Freq: Once | INTRAVENOUS | Status: AC
  Administered 2016-04-16: 0.25 mg via INTRAVENOUS

## 2016-04-16 MED ORDER — SODIUM CHLORIDE 0.9 % IV SOLN
10.0000 mg | Freq: Once | INTRAVENOUS | Status: AC
  Administered 2016-04-16: 10 mg via INTRAVENOUS
  Filled 2016-04-16: qty 1

## 2016-04-16 MED ORDER — SODIUM CHLORIDE 0.9 % IV SOLN
490.5000 mg | Freq: Once | INTRAVENOUS | Status: AC
  Administered 2016-04-16: 490 mg via INTRAVENOUS
  Filled 2016-04-16: qty 49

## 2016-04-16 NOTE — Patient Instructions (Signed)
[facility] Discharge Instructions for Patients Receiving Chemotherapy  Today you received the following chemotherapy agents: Abraxane and Carboplatin.  To help prevent nausea and vomiting after your treatment, we encourage you to take your nausea medication as directed by your doctor.. If you develop nausea and vomiting that is not controlled by your nausea medication, call the clinic.   BELOW ARE SYMPTOMS THAT SHOULD BE REPORTED IMMEDIATELY:  *FEVER GREATER THAN 100.5 F  *CHILLS WITH OR WITHOUT FEVER  NAUSEA AND VOMITING THAT IS NOT CONTROLLED WITH YOUR NAUSEA MEDICATION  *UNUSUAL SHORTNESS OF BREATH  *UNUSUAL BRUISING OR BLEEDING  TENDERNESS IN MOUTH AND THROAT WITH OR WITHOUT PRESENCE OF ULCERS  *URINARY PROBLEMS  *BOWEL PROBLEMS  UNUSUAL RASH Items with * indicate a potential emergency and should be followed up as soon as possible.  Feel free to call the clinic you have any questions or concerns. The clinic phone number is [phone].  Please show the [facility] at check-in to the Emergency Department and triage nurse.

## 2016-04-16 NOTE — Telephone Encounter (Signed)
Gave pt cal & avs °

## 2016-04-16 NOTE — Progress Notes (Signed)
[facility] [phone]   [phone]) [phone]  OFFICE PROGRESS NOTE  [name], MD [place] [address]  DIAGNOSIS:  1) Stage IV (T3, T3, M1b) non-small cell lung cancer, squamous cell carcinoma (PDL1 expression 40%), presented with large left lower lobe lung mass in addition to pulmonary nodule in the right upper lobe as well as multiple solid and cavitary nodules bilaterally in addition to mediastinal, right supraclavicular and right axillary lymphadenopathy as well as bone and brain metastasis, diagnosed in June 2017. PDL 1 expression is a still pending.  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 (Pathologic T3, N0, M0). 1.5 cm primary. No vascular or lymphatic invasion.  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 4.2 cm cavitary right middle lobe lesion with associated right hilar adenopathy which was PET avid.   PRIOR THERAPY: 1) status post right hemicolectomy 06/09/2002. (Pathologic T3, N0, M0). 1.5 cm primary. No vascular or lymphatic invasion followed by  adjuvant chemotherapy with 6 cycles of 5-fluorouracil plus leucovorin. 2) status post right middle lobe resection by Dr. Arlyce Dice on 08/20/2007 following neoadjuvant chemoradiation. He received weekly carboplatin and Taxol during radiation. Radiation given between August 4 and 06/08/2007. 36 gray to the primary tumor. Boost to 45 gray. 2 additional soft tissue deposits in the right middle lobe satellite lesions versus second primary also treated with a final total tumor dose of 65 gray.  CURRENT THERAPY: Systemic chemotherapy with carboplatin for AUC of 5 on day 1 and Abraxane 100 MG/M2 on days 1 and 8 every 3 weeks. First dose 03/25/2016.  Status post one cycle.  INTERVAL HISTORY: [name] [name] 70 y.o. male returns to the clinic today for followup visit accompanied by his wife. The patient continues to complain of central chest pain as well as shortness of breath with exertion and increasing fatigue and weakness. He denied having any significant nausea or vomiting. He has no weight loss or night sweats. The patient continues to have the chest pain with shortness of breath but no significant hemoptysis. He was started on systemic chemotherapy with carboplatin and Abraxane status post 1 cycle and tolerated the first cycle fairly well. He is here today to start cycle #2.   MEDICAL HISTORY: Past Medical History:  Diagnosis Date  . Adrenal insufficiency ([facility])   . Bone cancer ([facility])    L5 bone metastasis  . BPH (benign prostatic hyperplasia) 01/24/2013  . Brain cancer ([facility])    4 mm solitary brain metastasis  . Cancer ([facility])    lung/colon ca  . Cancer of middle lobe of lung ([facility]) 01/24/2013   4.5 cm RML w associated R hilar adenopathy S/P chemo/RT then Surgery 8/08  . Chronic back pain   . Collagen vascular disease ([facility])    RA  . Colon cancer ([facility]) 01/24/2013   T3N0M0  right colon  R hemicolectomy   . Encounter for antineoplastic chemotherapy 03/18/2016  . GERD (gastroesophageal reflux disease)   . Headache   . HOH (hard of hearing)   . Lung mass   . PONV (postoperative nausea and vomiting)    adrenal insufficiency; needs steriod prior to procedures  . Raynauds phenomenon   . Rheumatoid arthritis(714.0) 01/24/2013   Since age 69    ALLERGIES:  is  allergic to aspirin; diphenhydramine hcl; and remicade [infliximab].  MEDICATIONS:  Current Outpatient Prescriptions  Medication Sig Dispense Refill  . acetaminophen (TYLENOL) 500 MG tablet Take 1,500 mg by mouth every 6 (six) hours as needed (For pain.). Reported on 03/06/2016    . amLODipine (NORVASC) 2.5 MG tablet Take 2.5 mg by mouth daily with lunch.     . Calcium  Carbonate-Vitamin D (CALCIUM + D PO) Take 1 tablet by mouth daily.     Marland Kitchen leflunomide (ARAVA) 10 MG tablet Take 10 mg by mouth daily.     Marland Kitchen LORazepam (ATIVAN) 0.5 MG tablet Take one tab po 30 minutes prior to MRI scans or radiation treatment 30 tablet 0  . omeprazole (PRILOSEC) 20 MG capsule Take 20 mg by mouth daily.     Marland Kitchen oxyCODONE (OXY IR/ROXICODONE) 5 MG immediate release tablet Take 1 tablet (5 mg total) by mouth every 6 (six) hours as needed for severe pain. 30 tablet 0  . predniSONE (DELTASONE) 5 MG tablet Take 5 mg by mouth daily with breakfast.    . prochlorperazine (COMPAZINE) 10 MG tablet Take 1 tablet (10 mg total) by mouth every 6 (six) hours as needed for nausea or vomiting. 30 tablet 0  . vitamin C (ASCORBIC ACID) 500 MG tablet Take 500 mg by mouth daily.    . vitamin E 400 UNIT capsule Take 400 Units by mouth daily.     No current facility-administered medications for this visit.     SURGICAL HISTORY:  Past Surgical History:  Procedure Laterality Date  . APPENDECTOMY    . COLECTOMY  2004   right-hemi  . CYSTOSCOPY N/A 10/19/2015   Procedure: CYSTOSCOPY;  Surgeon: Carolan Clines, MD;  Location: [facility];  Service: Urology;  Laterality: N/A;  . GASTRECTOMY  1974   bleeding ulcers  . INGUINAL HERNIA REPAIR Right 06/03/2014   Procedure: HERNIA REPAIR INGUINAL ADULT;  Surgeon: Kaylyn Lim, MD;  Location: [facility];  Service: General;  Laterality: Right;  . [place] SURGERY  2003  . lung lobectomy  2007   right  . tendons ruptured  2010   right-hand  . TRANSURETHRAL RESECTION OF PROSTATE N/A 10/19/2015   Procedure: TRANSURETHRAL RESECTION OF THE PROSTATE (TURP);  Surgeon: Carolan Clines, MD;  Location: [facility];  Service: Urology;  Laterality: N/A;  . VIDEO BRONCHOSCOPY WITH ENDOBRONCHIAL NAVIGATION N/A 03/04/2016   Procedure: VIDEO BRONCHOSCOPY WITH BRUSHINGS AND BIOPSY;  Surgeon: Grace Isaac, MD;  Location: [facility];  Service: Thoracic;  Laterality:  N/A;    REVIEW OF SYSTEMS:  A comprehensive review of systems was negative except for: Constitutional: positive for fatigue   PHYSICAL EXAMINATION: General appearance: alert, cooperative and no distress Head: Normocephalic, without obvious abnormality, atraumatic Neck: no adenopathy, no JVD, supple, symmetrical, trachea midline and thyroid not enlarged, symmetric, no tenderness/mass/nodules Lymph nodes: Cervical, supraclavicular, and axillary nodes normal. Resp: clear to auscultation bilaterally Back: symmetric, no curvature. ROM normal. No CVA tenderness. Cardio: regular rate and rhythm, S1, S2 normal, no murmur, click, rub or gallop GI: soft, non-tender; bowel sounds normal; no masses,  no organomegaly Extremities: extremities normal, atraumatic, no cyanosis or edema Neurologic: Alert and oriented X 3, normal strength and tone. Normal symmetric reflexes. Normal coordination and gait  ECOG PERFORMANCE STATUS: 1 - Symptomatic but completely ambulatory  Blood pressure 128/84, pulse 99, temperature 98.3 F (36.8 C), temperature source Oral, resp. rate 18, weight 164 lb (74.4 kg), SpO2 100 %.  LABORATORY DATA: Lab Results  Component Value Date   WBC 3.1 (L) 04/16/2016   HGB 10.9 (L) 04/16/2016   HCT 32.9 (L) 04/16/2016   MCV 84.6 04/16/2016   PLT 185 04/16/2016      Chemistry      Component Value Date/Time   NA 133 (L) 04/02/2016 1403   K 3.4 (L) 04/02/2016 1403   CL 103 03/04/2016 1147   CL 98 01/15/2013 1454   CO2 27 04/02/2016 1403   BUN 12.7 04/02/2016 1403   CREATININE 0.8 04/02/2016 1403      Component Value Date/Time   CALCIUM 9.3 04/02/2016 1403   ALKPHOS 131 04/02/2016 1403   AST 24 04/02/2016 1403   ALT 33 04/02/2016 1403   BILITOT 0.91 04/02/2016 1403       RADIOGRAPHIC STUDIES: Mr Jeri Cos PZ Contrast  Result Date: 03/21/2016 CLINICAL DATA:  70 year old male with stage IV non-small cell lung cancer. Solitary left frontal lobe brain metastasis detected in  June. Study for stereotactic radiosurgery planning. Subsequent encounter. EXAM: MRI HEAD WITHOUT AND WITH CONTRAST TECHNIQUE: Multiplanar, multiecho pulse sequences of the brain and surrounding structures were obtained without and with intravenous contrast. CONTRAST:  68m MULTIHANCE GADOBENATE DIMEGLUMINE 529 MG/ML IV SOLN COMPARISON:  Brain MRI 03/01/2016, 04/13/2007. FINDINGS: Stable small 5 mm rim and nodular enhancing lesion in the left superior frontal lobe on series 12, image 122 today. Trace if any associated edema, and no associated mass effect. No other brain metastasis or abnormal brain enhancement identified. No dural thickening. Major intracranial vascular flow voids are stable, with intracranial artery dolichoectasia. Patchy and confluent bilateral cerebral white matter and deep gray matter T2 and FLAIR hyperintensity again noted. 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 Stable comparatively mild involvement of the pons. On diffusion there is a small 3-4 mm focus of increased trace diffusion signal in the left posterior frontal lobe subcortical white matter on series 5, image 69, no associated enhancement. This is not conspicuous on ADC. Elsewhere no restricted diffusion or evidence of acute infarction. No ventriculomegaly, extra-axial collection or acute intracranial hemorrhage. Cervicomedullary junction and pituitary are within normal limits. Stable degenerative changes in the visualized cervical spine. Visualized bone marrow signal is within normal limits. Visible internal auditory structures appear normal. Mastoids remain clear. Stable paranasal sinuses with right maxillary sinus mucous retention cysts. Negative orbit and scalp soft tissues. IMPRESSION: 1. Stable solitary 5 mm anterior left frontal lobe brain metastasis. Minimal if any edema and no mass effect. 2. No new metastatic disease to the brain identified. 3. Chronic small vessel disease, and a small nonenhancing focus of increased  trace diffusion in the white matter near the left motor strip might reflect a subacute lacune. Electronically Signed   By: HGenevie AnnM.D.   On: 03/21/2016 15:45   ASSESSMENT AND PLAN: This is a very pleasant 70years old white male with  1) history of stage II colon adenocarcinoma diagnosed in 2003   2) history stage III non-small cell lung cancer diagnosed in 2008 status post dissection as well as adjuvant therapy. He has been observation since that time with no significant evidence for disease recurrence.  3) recurrent and metastatic ( stage IV) non-small cell lung cancer, squamous cell carcinoma, PDL1 expression 40%, diagnosed in June 2017 when the patient presented for routine annual exam and his blood work showed elevation of CEA up to 11.3. Last year it was 4.1.  I ordered repeat CT scan of the chest, abdomen and pelvis which was performed a few  days ago and unfortunately it showed multiple new pulmonary nodules and masses concerning for either metastatic disease to the lung or multiple new synchronous primaries. There was also what appears to be an erosion through the inferior wall of the proximal left mainstem bronchus with a small amount of gas in the middle mediastinum and the subcarinal region. There was also evidence of subcarinal and lower right paratracheal lymphadenopathy. The scan also showed fat containing lesion in the distal descending colon questionable for colonic lipoma or lipomatous polyp. This was followed by a PET scan which showed hypermetabolic activity in the left lower lobe lung mass in addition to the right upper lobe pulmonary nodule as well as multiple solid and cavitary bilateral pulmonary nodules and suspicious L5 bone metastasis. 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 The patient underwent bronchoscopy under the care of Dr. Servando Snare and the final pathology is consistent with squamous cell carcinoma. He completed stereotactic radiotherapy  to the solitary brain lesion under the care of Dr. Tammi Klippel.  His currently on systemic chemotherapy with carboplatin and Abraxane status post 1 cycle and tolerated the first cycle of his treatment fairly well. I recommended for the patient to proceed with cycle #2 today as a scheduled. For the chest pain management, he will continue on oxycodone 5 mg by mouth every 6 hours as needed.  I will see him back for follow-up visit in 3 weeks for evaluation before starting cycle #3. The patient and his wife agreed to the current plan. He was advised to call immediately if he has any concerning symptoms in the interval. The patient voices understanding of current disease status and treatment options and is in agreement with the current care plan.  All questions were answered. The patient knows to call the clinic with any problems, questions or concerns. We can certainly see the patient much sooner if necessary.  Disclaimer: This note was dictated with voice recognition software. Similar sounding words can inadvertently be transcribed and may not be corrected upon review.

## 2016-04-17 ENCOUNTER — Telehealth: Payer: Self-pay | Admitting: Internal Medicine

## 2016-04-17 NOTE — Telephone Encounter (Signed)
spoke w/ pt confirmed 8/21 apt times

## 2016-04-22 ENCOUNTER — Other Ambulatory Visit (HOSPITAL_BASED_OUTPATIENT_CLINIC_OR_DEPARTMENT_OTHER): Payer: Medicare Other

## 2016-04-22 ENCOUNTER — Ambulatory Visit (HOSPITAL_BASED_OUTPATIENT_CLINIC_OR_DEPARTMENT_OTHER): Payer: Medicare Other

## 2016-04-22 VITALS — BP 136/87 | HR 100 | Resp 18

## 2016-04-22 DIAGNOSIS — C3490 Malignant neoplasm of unspecified part of unspecified bronchus or lung: Secondary | ICD-10-CM | POA: Diagnosis present

## 2016-04-22 DIAGNOSIS — Z5111 Encounter for antineoplastic chemotherapy: Secondary | ICD-10-CM

## 2016-04-22 DIAGNOSIS — C3492 Malignant neoplasm of unspecified part of left bronchus or lung: Secondary | ICD-10-CM

## 2016-04-22 DIAGNOSIS — C7931 Secondary malignant neoplasm of brain: Secondary | ICD-10-CM

## 2016-04-22 LAB — CBC WITH DIFFERENTIAL/PLATELET
BASO%: 0.8 % (ref 0.0–2.0)
BASOS ABS: 0 10*3/uL (ref 0.0–0.1)
EOS%: 0.8 % (ref 0.0–7.0)
Eosinophils Absolute: 0 10*3/uL (ref 0.0–0.5)
HEMATOCRIT: 29.2 % — AB (ref 38.4–49.9)
HGB: 10 g/dL — ABNORMAL LOW (ref 13.0–17.1)
LYMPH%: 16.7 % (ref 14.0–49.0)
MCH: 28.7 pg (ref 27.2–33.4)
MCHC: 34.2 g/dL (ref 32.0–36.0)
MCV: 83.7 fL (ref 79.3–98.0)
MONO#: 0.1 10*3/uL (ref 0.1–0.9)
MONO%: 3 % (ref 0.0–14.0)
NEUT#: 2.1 10*3/uL (ref 1.5–6.5)
NEUT%: 78.7 % — AB (ref 39.0–75.0)
Platelets: 131 10*3/uL — ABNORMAL LOW (ref 140–400)
RBC: 3.49 10*6/uL — AB (ref 4.20–5.82)
RDW: 20 % — ABNORMAL HIGH (ref 11.0–14.6)
WBC: 2.6 10*3/uL — ABNORMAL LOW (ref 4.0–10.3)
lymph#: 0.4 10*3/uL — ABNORMAL LOW (ref 0.9–3.3)

## 2016-04-22 LAB — COMPREHENSIVE METABOLIC PANEL
ALK PHOS: 72 U/L (ref 40–150)
ALT: 19 U/L (ref 0–55)
AST: 19 U/L (ref 5–34)
Albumin: 3.1 g/dL — ABNORMAL LOW (ref 3.5–5.0)
Anion Gap: 10 mEq/L (ref 3–11)
BILIRUBIN TOTAL: 0.58 mg/dL (ref 0.20–1.20)
BUN: 11.1 mg/dL (ref 7.0–26.0)
CALCIUM: 9.1 mg/dL (ref 8.4–10.4)
CO2: 24 mEq/L (ref 22–29)
Chloride: 100 mEq/L (ref 98–109)
Creatinine: 0.8 mg/dL (ref 0.7–1.3)
EGFR: 89 mL/min/{1.73_m2} — AB (ref >90)
GLUCOSE: 110 mg/dL (ref 70–140)
POTASSIUM: 4 meq/L (ref 3.5–5.1)
SODIUM: 134 meq/L — AB (ref 136–145)
Total Protein: 6.3 g/dL — ABNORMAL LOW (ref 6.4–8.3)

## 2016-04-22 MED ORDER — PROCHLORPERAZINE MALEATE 10 MG PO TABS
10.0000 mg | ORAL_TABLET | Freq: Once | ORAL | Status: AC
  Administered 2016-04-22: 10 mg via ORAL

## 2016-04-22 MED ORDER — SODIUM CHLORIDE 0.9 % IV SOLN
Freq: Once | INTRAVENOUS | Status: AC
  Administered 2016-04-22: 16:00:00 via INTRAVENOUS

## 2016-04-22 MED ORDER — PACLITAXEL PROTEIN-BOUND CHEMO INJECTION 100 MG
100.0000 mg/m2 | Freq: Once | INTRAVENOUS | Status: AC
  Administered 2016-04-22: 200 mg via INTRAVENOUS
  Filled 2016-04-22: qty 40

## 2016-04-22 MED ORDER — PROCHLORPERAZINE MALEATE 10 MG PO TABS
ORAL_TABLET | ORAL | Status: AC
  Filled 2016-04-22: qty 1

## 2016-04-22 NOTE — Patient Instructions (Signed)
[name] Discharge Instructions for Patients Receiving Chemotherapy  Today you received the following chemotherapy agent: Carboplatin.  To help prevent nausea and vomiting after your treatment, we encourage you to take your nausea medication as prescribed.   If you develop nausea and vomiting that is not controlled by your nausea medication, call the clinic.   BELOW ARE SYMPTOMS THAT SHOULD BE REPORTED IMMEDIATELY:  *FEVER GREATER THAN 100.5 F  *CHILLS WITH OR WITHOUT FEVER  NAUSEA AND VOMITING THAT IS NOT CONTROLLED WITH YOUR NAUSEA MEDICATION  *UNUSUAL SHORTNESS OF BREATH  *UNUSUAL BRUISING OR BLEEDING  TENDERNESS IN MOUTH AND THROAT WITH OR WITHOUT PRESENCE OF ULCERS  *URINARY PROBLEMS  *BOWEL PROBLEMS  UNUSUAL RASH Items with * indicate a potential emergency and should be followed up as soon as possible.  Feel free to call the clinic you have any questions or concerns. The clinic phone number is [phone].  Please show the [name] at check-in to the Emergency Department and triage nurse.

## 2016-04-29 ENCOUNTER — Ambulatory Visit: Payer: Self-pay | Admitting: Radiation Oncology

## 2016-05-06 ENCOUNTER — Ambulatory Visit (HOSPITAL_BASED_OUTPATIENT_CLINIC_OR_DEPARTMENT_OTHER): Payer: Medicare Other | Admitting: Internal Medicine

## 2016-05-06 ENCOUNTER — Ambulatory Visit (HOSPITAL_BASED_OUTPATIENT_CLINIC_OR_DEPARTMENT_OTHER): Payer: Medicare Other

## 2016-05-06 ENCOUNTER — Encounter: Payer: Self-pay | Admitting: Internal Medicine

## 2016-05-06 ENCOUNTER — Other Ambulatory Visit: Payer: Medicare Other

## 2016-05-06 ENCOUNTER — Other Ambulatory Visit (HOSPITAL_BASED_OUTPATIENT_CLINIC_OR_DEPARTMENT_OTHER): Payer: Medicare Other

## 2016-05-06 VITALS — BP 142/92 | HR 100 | Temp 98.6°F | Resp 18 | Ht 69.0 in | Wt 172.9 lb

## 2016-05-06 DIAGNOSIS — C3490 Malignant neoplasm of unspecified part of unspecified bronchus or lung: Secondary | ICD-10-CM

## 2016-05-06 DIAGNOSIS — Z5111 Encounter for antineoplastic chemotherapy: Secondary | ICD-10-CM | POA: Diagnosis present

## 2016-05-06 DIAGNOSIS — C7931 Secondary malignant neoplasm of brain: Secondary | ICD-10-CM | POA: Diagnosis not present

## 2016-05-06 DIAGNOSIS — Z85038 Personal history of other malignant neoplasm of large intestine: Secondary | ICD-10-CM | POA: Diagnosis not present

## 2016-05-06 DIAGNOSIS — R079 Chest pain, unspecified: Secondary | ICD-10-CM

## 2016-05-06 DIAGNOSIS — C3492 Malignant neoplasm of unspecified part of left bronchus or lung: Secondary | ICD-10-CM

## 2016-05-06 LAB — CBC WITH DIFFERENTIAL/PLATELET
BASO%: 0.7 % (ref 0.0–2.0)
BASOS ABS: 0 10*3/uL (ref 0.0–0.1)
EOS ABS: 0 10*3/uL (ref 0.0–0.5)
EOS%: 0.3 % (ref 0.0–7.0)
HEMATOCRIT: 27.9 % — AB (ref 38.4–49.9)
HEMOGLOBIN: 9.2 g/dL — AB (ref 13.0–17.1)
LYMPH#: 0.7 10*3/uL — AB (ref 0.9–3.3)
LYMPH%: 22.7 % (ref 14.0–49.0)
MCH: 29.6 pg (ref 27.2–33.4)
MCHC: 33 g/dL (ref 32.0–36.0)
MCV: 89.7 fL (ref 79.3–98.0)
MONO#: 0.5 10*3/uL (ref 0.1–0.9)
MONO%: 16.9 % — ABNORMAL HIGH (ref 0.0–14.0)
NEUT#: 1.8 10*3/uL (ref 1.5–6.5)
NEUT%: 59.4 % (ref 39.0–75.0)
Platelets: 151 10*3/uL (ref 140–400)
RBC: 3.11 10*6/uL — ABNORMAL LOW (ref 4.20–5.82)
RDW: 24.7 % — AB (ref 11.0–14.6)
WBC: 3.1 10*3/uL — ABNORMAL LOW (ref 4.0–10.3)

## 2016-05-06 LAB — COMPREHENSIVE METABOLIC PANEL
ALBUMIN: 3 g/dL — AB (ref 3.5–5.0)
ALK PHOS: 66 U/L (ref 40–150)
ALT: 35 U/L (ref 0–55)
ANION GAP: 8 meq/L (ref 3–11)
AST: 25 U/L (ref 5–34)
BILIRUBIN TOTAL: 0.47 mg/dL (ref 0.20–1.20)
BUN: 6.8 mg/dL — AB (ref 7.0–26.0)
CALCIUM: 8.8 mg/dL (ref 8.4–10.4)
CHLORIDE: 100 meq/L (ref 98–109)
CO2: 23 mEq/L (ref 22–29)
Creatinine: 0.7 mg/dL (ref 0.7–1.3)
Glucose: 112 mg/dl (ref 70–140)
POTASSIUM: 3.7 meq/L (ref 3.5–5.1)
Sodium: 132 mEq/L — ABNORMAL LOW (ref 136–145)
Total Protein: 5.9 g/dL — ABNORMAL LOW (ref 6.4–8.3)

## 2016-05-06 LAB — TECHNOLOGIST REVIEW

## 2016-05-06 MED ORDER — PACLITAXEL PROTEIN-BOUND CHEMO INJECTION 100 MG
100.0000 mg/m2 | Freq: Once | INTRAVENOUS | Status: AC
  Administered 2016-05-06: 200 mg via INTRAVENOUS
  Filled 2016-05-06: qty 40

## 2016-05-06 MED ORDER — SODIUM CHLORIDE 0.9 % IV SOLN
Freq: Once | INTRAVENOUS | Status: AC
  Administered 2016-05-06: 15:00:00 via INTRAVENOUS

## 2016-05-06 MED ORDER — PALONOSETRON HCL INJECTION 0.25 MG/5ML
0.2500 mg | Freq: Once | INTRAVENOUS | Status: AC
Start: 2016-05-06 — End: 2016-05-06
  Administered 2016-05-06: 0.25 mg via INTRAVENOUS

## 2016-05-06 MED ORDER — DEXAMETHASONE SODIUM PHOSPHATE 100 MG/10ML IJ SOLN
10.0000 mg | Freq: Once | INTRAMUSCULAR | Status: AC
  Administered 2016-05-06: 10 mg via INTRAVENOUS
  Filled 2016-05-06: qty 1

## 2016-05-06 MED ORDER — SODIUM CHLORIDE 0.9 % IV SOLN
490.5000 mg | Freq: Once | INTRAVENOUS | Status: AC
  Administered 2016-05-06: 490 mg via INTRAVENOUS
  Filled 2016-05-06: qty 49

## 2016-05-06 MED ORDER — PALONOSETRON HCL INJECTION 0.25 MG/5ML
INTRAVENOUS | Status: AC
  Filled 2016-05-06: qty 5

## 2016-05-06 NOTE — Progress Notes (Signed)
[name] [name] [phone]   [phone]  OFFICE PROGRESS NOTE  [name] Kluver, MD [place] [address]  DIAGNOSIS:  1) Stage IV (T3, T3, M1b) non-small cell lung cancer, squamous cell carcinoma (PDL1 expression 40%), presented with large left lower lobe lung mass in addition to pulmonary nodule in the right upper lobe as well as multiple solid and cavitary nodules bilaterally in addition to mediastinal, right supraclavicular and right axillary lymphadenopathy as well as bone and brain metastasis, diagnosed in June 2017. PDL 1 expression is a still pending.  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 (Pathologic T3, N0, M0). 1.5 cm primary. No vascular or lymphatic invasion.  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 4.2 cm cavitary right middle lobe lesion with associated right hilar adenopathy which was PET avid.   PRIOR THERAPY: 1) status post right hemicolectomy 06/09/2002. (Pathologic T3, N0, M0). 1.5 cm primary. No vascular or lymphatic invasion followed by  adjuvant chemotherapy with 6 cycles of 5-fluorouracil plus leucovorin. 2) status post right middle lobe resection by Dr. Arlyce Dice on 08/20/2007 following neoadjuvant chemoradiation. He received weekly carboplatin and Taxol during radiation. Radiation given between August 4 and 06/08/2007. 36 gray to the primary tumor. Boost to 45 gray. 2 additional soft tissue deposits in the right middle lobe satellite lesions versus second primary also treated with a final total tumor dose of 65 gray.  CURRENT THERAPY: Systemic chemotherapy with carboplatin for AUC of 5 on day 1 and Abraxane 100 MG/M2 on days 1 and 8 every 3 weeks. First dose 03/25/2016.  Status post 2 cycles.  INTERVAL HISTORY: [name] [name] 70 y.o. male returns to the clinic today for followup visit accompanied by his wife. The patient is feeling much better today. He is tolerating his systemic chemotherapy with carboplatin and Abraxane fairly well with no significant adverse effects except for mild peripheral neuropathy. He denied having any significant nausea or vomiting. He has no fever or chills. He denied having any significant chest pain, shortness of breath, cough or hemoptysis. He started having pain in the right hip area and he is wondering about receiving another steroid injection to this area. He is here today for evaluation before starting cycle #3.  MEDICAL HISTORY: Past Medical History:  Diagnosis Date  . Adrenal insufficiency ([facility])   . Bone cancer ([facility])    L5 bone metastasis  . BPH (benign prostatic hyperplasia) 01/24/2013  . Brain cancer ([facility])    4 mm solitary brain metastasis  . Cancer ([facility])    lung/colon ca  . Cancer of middle lobe of lung ([facility]) 01/24/2013   4.5 cm RML w associated R hilar adenopathy S/P chemo/RT then Surgery 8/08  . Chronic back pain   . Collagen vascular disease ([facility])    RA  . Colon cancer ([facility]) 01/24/2013   T3N0M0  right colon  R hemicolectomy   . Encounter for antineoplastic chemotherapy 03/18/2016  . GERD (gastroesophageal reflux disease)   . Headache   . HOH (hard of hearing)   . Lung mass   . PONV (postoperative nausea and vomiting)    adrenal insufficiency; needs steriod prior to procedures  . Raynauds phenomenon   . Rheumatoid arthritis(714.0) 01/24/2013   Since age 42  ALLERGIES:  is allergic to aspirin; diphenhydramine hcl; and remicade [infliximab].  MEDICATIONS:  Current Outpatient Prescriptions  Medication Sig Dispense Refill  . acetaminophen (TYLENOL) 500 MG tablet Take 1,500 mg by mouth every 6 (six) hours as needed (For pain.). Reported on 03/06/2016    . amLODipine (NORVASC) 2.5 MG tablet Take 2.5 mg by  mouth daily with lunch.     . Calcium Carbonate-Vitamin D (CALCIUM + D PO) Take 1 tablet by mouth daily.     Marland Kitchen leflunomide (ARAVA) 10 MG tablet Take 10 mg by mouth daily.     Marland Kitchen LORazepam (ATIVAN) 0.5 MG tablet Take one tab po 30 minutes prior to MRI scans or radiation treatment 30 tablet 0  . omeprazole (PRILOSEC) 20 MG capsule Take 20 mg by mouth daily.     Marland Kitchen oxyCODONE (OXY IR/ROXICODONE) 5 MG immediate release tablet Take 1 tablet (5 mg total) by mouth every 6 (six) hours as needed for severe pain. 30 tablet 0  . predniSONE (DELTASONE) 5 MG tablet Take 5 mg by mouth daily with breakfast.    . prochlorperazine (COMPAZINE) 10 MG tablet Take 1 tablet (10 mg total) by mouth every 6 (six) hours as needed for nausea or vomiting. 30 tablet 0  . vitamin C (ASCORBIC ACID) 500 MG tablet Take 500 mg by mouth daily.    . vitamin E 400 UNIT capsule Take 400 Units by mouth daily.     No current facility-administered medications for this visit.     SURGICAL HISTORY:  Past Surgical History:  Procedure Laterality Date  . APPENDECTOMY    . COLECTOMY  2004   right-hemi  . CYSTOSCOPY N/A 10/19/2015   Procedure: CYSTOSCOPY;  Surgeon: Carolan Clines, MD;  Location: [facility];  Service: Urology;  Laterality: N/A;  . GASTRECTOMY  1974   bleeding ulcers  . INGUINAL HERNIA REPAIR Right 06/03/2014   Procedure: HERNIA REPAIR INGUINAL ADULT;  Surgeon: Kaylyn Lim, MD;  Location: [facility];  Service: General;  Laterality: Right;  . [place] SURGERY  2003  . lung lobectomy  2007   right  . tendons ruptured  2010   right-hand  . TRANSURETHRAL RESECTION OF PROSTATE N/A 10/19/2015   Procedure: TRANSURETHRAL RESECTION OF THE PROSTATE (TURP);  Surgeon: Carolan Clines, MD;  Location: [facility];  Service: Urology;  Laterality: N/A;  . VIDEO BRONCHOSCOPY WITH ENDOBRONCHIAL NAVIGATION N/A 03/04/2016   Procedure: VIDEO BRONCHOSCOPY WITH BRUSHINGS AND BIOPSY;  Surgeon: Grace Isaac, MD;  Location: [facility];  Service: Thoracic;  Laterality: N/A;    REVIEW OF SYSTEMS:  A comprehensive review of systems was negative except for: Constitutional: positive for fatigue Musculoskeletal: positive for bone pain   PHYSICAL EXAMINATION: General appearance: alert, cooperative and no distress Head: Normocephalic, without obvious abnormality, atraumatic Neck: no adenopathy, no JVD, supple, symmetrical, trachea midline and thyroid not enlarged, symmetric, no tenderness/mass/nodules Lymph nodes: Cervical, supraclavicular, and axillary nodes normal. Resp: clear to auscultation bilaterally Back: symmetric, no curvature. ROM normal. No CVA tenderness. Cardio: regular rate and rhythm, S1, S2 normal, no murmur, click, rub or gallop GI: soft, non-tender; bowel sounds normal; no masses,  no organomegaly Extremities: extremities normal, atraumatic, no cyanosis or edema Neurologic: Alert and oriented X 3, normal strength and tone. Normal symmetric reflexes. Normal coordination and gait  ECOG PERFORMANCE STATUS: 1 - Symptomatic but completely ambulatory  There were no vitals taken for this visit.  LABORATORY DATA: Lab Results  Component Value Date   WBC 3.1 (  L) 05/06/2016   HGB 9.2 (L) 05/06/2016   HCT 27.9 (L) 05/06/2016   MCV 89.7 05/06/2016   PLT 151 05/06/2016      Chemistry      Component Value Date/Time   NA 134 (L) 04/22/2016 1421   K 4.0 04/22/2016 1421   CL 103 03/04/2016 1147   CL 98 01/15/2013 1454   CO2 24 04/22/2016 1421   BUN 11.1 04/22/2016 1421   CREATININE 0.8 04/22/2016 1421      Component Value Date/Time   CALCIUM 9.1 04/22/2016 1421   ALKPHOS 72 04/22/2016 1421   AST 19 04/22/2016 1421   ALT 19 04/22/2016 1421   BILITOT 0.58 04/22/2016 1421       RADIOGRAPHIC STUDIES: No results found. ASSESSMENT AND PLAN: This is a very pleasant 70 years old white male with  1) history of stage II colon adenocarcinoma diagnosed in 2003   2) history stage III non-small cell lung  cancer diagnosed in 2008 status post dissection as well as adjuvant therapy. He has been observation since that time with no significant evidence for disease recurrence.  3) recurrent and metastatic ( stage IV) non-small cell lung cancer, squamous cell carcinoma, PDL1 expression 40%, diagnosed in June 2017 when the patient presented for routine annual exam and his blood work showed elevation of CEA up to 11.3. Last year it was 4.1.  I ordered repeat CT scan of the chest, abdomen and pelvis which was performed a few days ago and unfortunately it showed multiple new pulmonary nodules and masses concerning for either metastatic disease to the lung or multiple new synchronous primaries. There was also what appears to be an erosion through the inferior wall of the proximal left mainstem bronchus with a small amount of gas in the middle mediastinum and the subcarinal region. There was also evidence of subcarinal and lower right paratracheal lymphadenopathy. The scan also showed fat containing lesion in the distal descending colon questionable for colonic lipoma or lipomatous polyp. This was followed by a PET scan which showed hypermetabolic activity in the left lower lobe lung mass in addition to the right upper lobe pulmonary nodule as well as multiple solid and cavitary bilateral pulmonary nodules and suspicious L5 bone metastasis. 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 The patient underwent bronchoscopy under the care of Dr. Servando Snare and the final pathology is consistent with squamous cell carcinoma. He completed stereotactic radiotherapy to the solitary brain lesion under the care of Dr. Tammi Klippel.  His currently on systemic chemotherapy with carboplatin and Abraxane status post 2 cycles and tolerated the first 2 cycles of his treatment fairly well. I recommended for the patient to proceed with cycle #3 today as a scheduled. For the chest pain management, he will continue on  oxycodone 5 mg by mouth every 6 hours as needed. He will also contact Dr. Saintclair Halsted for the steroid injection. I will see him back for follow-up visit in 3 weeks for evaluation before starting cycle #4 I started repeating CT scan of the chest, abdomen and pelvis for restaging of his disease. The patient and his wife agreed to the current plan. He was advised to call immediately if he has any concerning symptoms in the interval. The patient voices understanding of current disease status and treatment options and is in agreement with the current care plan.  All questions were answered. The patient knows to call the clinic with any problems, questions or concerns. We can certainly see  the patient much sooner if necessary.  Disclaimer: This note was dictated with voice recognition software. Similar sounding words can inadvertently be transcribed and may not be corrected upon review.

## 2016-05-07 ENCOUNTER — Other Ambulatory Visit: Payer: Self-pay | Admitting: Neurosurgery

## 2016-05-07 DIAGNOSIS — M5137 Other intervertebral disc degeneration, lumbosacral region: Secondary | ICD-10-CM

## 2016-05-08 ENCOUNTER — Telehealth: Payer: Self-pay | Admitting: *Deleted

## 2016-05-08 ENCOUNTER — Ambulatory Visit
Admission: RE | Admit: 2016-05-08 | Discharge: 2016-05-08 | Disposition: A | Discharge status: Home / Self Care | Payer: Medicare Other | Source: Ambulatory Visit | Attending: Neurosurgery | Admitting: Neurosurgery

## 2016-05-08 DIAGNOSIS — M5137 Other intervertebral disc degeneration, lumbosacral region: Secondary | ICD-10-CM

## 2016-05-08 MED ORDER — IOPAMIDOL (ISOVUE-M 200) INJECTION 41%
1.0000 mL | Freq: Once | INTRAMUSCULAR | Status: AC
  Administered 2016-05-08: 1 mL via EPIDURAL

## 2016-05-08 MED ORDER — METHYLPREDNISOLONE ACETATE 40 MG/ML INJ SUSP (RADIOLOG
120.0000 mg | Freq: Once | INTRAMUSCULAR | Status: AC
  Administered 2016-05-08: 120 mg via EPIDURAL

## 2016-05-08 NOTE — Telephone Encounter (Signed)
Oncology Nurse Navigator Documentation  Oncology Nurse Navigator Flowsheets 05/08/2016  Navigator Encounter Type Telephone/I received word from registration [name] [name] needed to speak with me.  I called him today.  He states he is having pain in his back and that he is getting an injection today.  He did not need any assistance at this time.  I wished him luck on his procedure today.   Telephone Outgoing Call  Treatment Phase Treatment  Barriers/Navigation Needs Coordination of Care  Interventions Coordination of Care  Coordination of Care Appts  Acuity Level 1  Acuity Level 1 Minimal follow up required  Time Spent with Patient 15

## 2016-05-13 ENCOUNTER — Ambulatory Visit: Payer: Medicare Other

## 2016-05-13 ENCOUNTER — Ambulatory Visit (HOSPITAL_BASED_OUTPATIENT_CLINIC_OR_DEPARTMENT_OTHER): Payer: Medicare Other

## 2016-05-13 ENCOUNTER — Other Ambulatory Visit (HOSPITAL_BASED_OUTPATIENT_CLINIC_OR_DEPARTMENT_OTHER): Payer: Medicare Other

## 2016-05-13 ENCOUNTER — Ambulatory Visit
Admission: RE | Admit: 2016-05-13 | Discharge: 2016-05-13 | Disposition: A | Discharge status: Home / Self Care | Payer: Medicare Other | Source: Ambulatory Visit | Attending: Radiation Oncology | Admitting: Radiation Oncology

## 2016-05-13 ENCOUNTER — Encounter: Payer: Self-pay | Admitting: Radiation Oncology

## 2016-05-13 ENCOUNTER — Other Ambulatory Visit: Payer: Medicare Other

## 2016-05-13 VITALS — BP 136/86 | HR 94 | Temp 97.7°F | Resp 16 | Wt 167.9 lb

## 2016-05-13 DIAGNOSIS — C7931 Secondary malignant neoplasm of brain: Secondary | ICD-10-CM | POA: Diagnosis present

## 2016-05-13 DIAGNOSIS — C3492 Malignant neoplasm of unspecified part of left bronchus or lung: Secondary | ICD-10-CM

## 2016-05-13 DIAGNOSIS — Y842 Radiological procedure and radiotherapy as the cause of abnormal reaction of the patient, or of later complication, without mention of misadventure at the time of the procedure: Secondary | ICD-10-CM | POA: Insufficient documentation

## 2016-05-13 DIAGNOSIS — C3490 Malignant neoplasm of unspecified part of unspecified bronchus or lung: Secondary | ICD-10-CM

## 2016-05-13 DIAGNOSIS — Z5111 Encounter for antineoplastic chemotherapy: Secondary | ICD-10-CM | POA: Diagnosis present

## 2016-05-13 DIAGNOSIS — C801 Malignant (primary) neoplasm, unspecified: Secondary | ICD-10-CM | POA: Diagnosis not present

## 2016-05-13 LAB — CBC WITH DIFFERENTIAL/PLATELET
BASO%: 0 % (ref 0.0–2.0)
Basophils Absolute: 0 10*3/uL (ref 0.0–0.1)
EOS%: 0.3 % (ref 0.0–7.0)
Eosinophils Absolute: 0 10*3/uL (ref 0.0–0.5)
HCT: 28.8 % — ABNORMAL LOW (ref 38.4–49.9)
HGB: 9.8 g/dL — ABNORMAL LOW (ref 13.0–17.1)
LYMPH%: 17.9 % (ref 14.0–49.0)
MCH: 30.2 pg (ref 27.2–33.4)
MCHC: 34 g/dL (ref 32.0–36.0)
MCV: 88.9 fL (ref 79.3–98.0)
MONO#: 0.2 10*3/uL (ref 0.1–0.9)
MONO%: 4.3 % (ref 0.0–14.0)
NEUT%: 77.5 % — AB (ref 39.0–75.0)
NEUTROS ABS: 2.7 10*3/uL (ref 1.5–6.5)
PLATELETS: 145 10*3/uL (ref 140–400)
RBC: 3.24 10*6/uL — AB (ref 4.20–5.82)
RDW: 21.5 % — ABNORMAL HIGH (ref 11.0–14.6)
WBC: 3.5 10*3/uL — AB (ref 4.0–10.3)
lymph#: 0.6 10*3/uL — ABNORMAL LOW (ref 0.9–3.3)

## 2016-05-13 LAB — COMPREHENSIVE METABOLIC PANEL
ALT: 34 U/L (ref 0–55)
ANION GAP: 10 meq/L (ref 3–11)
AST: 21 U/L (ref 5–34)
Albumin: 3.3 g/dL — ABNORMAL LOW (ref 3.5–5.0)
Alkaline Phosphatase: 62 U/L (ref 40–150)
BUN: 11.8 mg/dL (ref 7.0–26.0)
CHLORIDE: 99 meq/L (ref 98–109)
CO2: 25 meq/L (ref 22–29)
Calcium: 9.3 mg/dL (ref 8.4–10.4)
Creatinine: 0.7 mg/dL (ref 0.7–1.3)
EGFR: >90 mL/min/{1.73_m2} (ref >90)
GLUCOSE: 95 mg/dL (ref 70–140)
Potassium: 3.9 mEq/L (ref 3.5–5.1)
SODIUM: 134 meq/L — AB (ref 136–145)
Total Bilirubin: 0.87 mg/dL (ref 0.20–1.20)
Total Protein: 6.5 g/dL (ref 6.4–8.3)

## 2016-05-13 MED ORDER — PROCHLORPERAZINE MALEATE 10 MG PO TABS
10.0000 mg | ORAL_TABLET | Freq: Once | ORAL | Status: AC
Start: 2016-05-13 — End: 2016-05-13
  Administered 2016-05-13: 10 mg via ORAL

## 2016-05-13 MED ORDER — PACLITAXEL PROTEIN-BOUND CHEMO INJECTION 100 MG
100.0000 mg/m2 | Freq: Once | INTRAVENOUS | Status: AC
  Administered 2016-05-13: 200 mg via INTRAVENOUS
  Filled 2016-05-13: qty 40

## 2016-05-13 MED ORDER — PROCHLORPERAZINE MALEATE 10 MG PO TABS
ORAL_TABLET | ORAL | Status: AC
  Filled 2016-05-13: qty 1

## 2016-05-13 MED ORDER — SODIUM CHLORIDE 0.9 % IV SOLN
Freq: Once | INTRAVENOUS | Status: AC
  Administered 2016-05-13: 12:00:00 via INTRAVENOUS

## 2016-05-13 NOTE — Progress Notes (Signed)
Weight loss noted. Vitals stable. Reports approximately three weeks ago received an oral prednisone taper because tendon broke in right shoulder but, now patient is on regular daily dose of prednisone. Patient denies taking decadron. Reports pain in right shoulder only if he "lifts his arm the wrong way." Reports he received prednisone injection in his back last week. Denies nausea or vomiting. Denies headaches. Steady gait noted. Reports his short term memory and temperament are normal. Reports chronic ringing in the ears is unchanged. Denies diplopia. Continues infusions once a week for two weeks then off a week. Attributes weight loss to "not eating on chemo days." No productive cough noted.   BP 136/86 (BP Location: Left Arm, Patient Position: Sitting, Cuff Size: Normal)   Pulse 94   Temp 97.7 F (36.5 C) (Oral)   Resp 16   Wt 167 lb 14.4 oz (76.2 kg)   SpO2 100%   BMI 24.79 kg/m  Wt Readings from Last 3 Encounters:  05/13/16 167 lb 14.4 oz (76.2 kg)  05/06/16 172 lb 14.4 oz (78.4 kg)  04/16/16 164 lb (74.4 kg)

## 2016-05-13 NOTE — Progress Notes (Signed)
Radiation Oncology         [phone] ________________________________  Name: [name] [name] MRN: [id number]  Date: 05/13/2016  DOB: Feb 11, 1946  Follow-Up Visit Note  CC: [name], MD  Curt Bears, MD  Diagnosis:   70 yo man with a solitary 5 mm left frontal brain metastasis.    ICD-9-CM ICD-10-CM   1. Metastasis to brain ([facility]CC) 198.3 C79.31     Interval Since Last Radiation: 6 weeks, 03/29/2016   Narrative:  The patient returns today for routine follow-up. Patient notes weight loss. Approximately three weeks ago, patient received an oral prednisone taper because tendon broke in right shoulder several weeks ago but, now patient is on regular daily dose of prednisone. Patient denies taking decadron. Reports pain in right shoulder only if he "lifts his arm the wrong way". Reports he received prednisone injection in his back last week. He reports problems lying down were alleviated in a couple of days following injection. Denies nausea or vomiting. Denies headaches. Steady gait noted. Reports his short term memory and temperament are normal. Reports chronic ringing in the ears is unchanged. Denies diplopia. Continues infusions once a week for two weeks then off a week. Attributes weight loss to "not eating on chemo days". No productive cough noted. He reports fatigue.                          ALLERGIES:  is allergic to aspirin; diphenhydramine hcl; and remicade [infliximab].  Meds: Current Outpatient Prescriptions  Medication Sig Dispense Refill  . acetaminophen (TYLENOL) 500 MG tablet Take 1,500 mg by mouth every 6 (six) hours as needed (For pain.). Reported on 03/06/2016    . amLODipine (NORVASC) 2.5 MG tablet Take 2.5 mg by mouth daily with lunch.     . Calcium Carbonate-Vitamin D (CALCIUM + D PO) Take 1 tablet by mouth daily.     Marland Kitchen leflunomide (ARAVA) 10 MG tablet Take 10 mg by mouth daily.     Marland Kitchen omeprazole (PRILOSEC) 20 MG capsule Take 20 mg by mouth daily.     Marland Kitchen  oxyCODONE (OXY IR/ROXICODONE) 5 MG immediate release tablet Take 1 tablet (5 mg total) by mouth every 6 (six) hours as needed for severe pain. 30 tablet 0  . predniSONE (DELTASONE) 5 MG tablet Take 5 mg by mouth daily with breakfast.    . vitamin C (ASCORBIC ACID) 500 MG tablet Take 500 mg by mouth daily.    . vitamin E 400 UNIT capsule Take 400 Units by mouth daily.    Marland Kitchen LORazepam (ATIVAN) 0.5 MG tablet Take one tab po 30 minutes prior to MRI scans or radiation treatment (Patient not taking: Reported on 05/13/2016) 30 tablet 0  . prochlorperazine (COMPAZINE) 10 MG tablet Take 1 tablet (10 mg total) by mouth every 6 (six) hours as needed for nausea or vomiting. (Patient not taking: Reported on 05/13/2016) 30 tablet 0   No current facility-administered medications for this encounter.     Physical Findings: The patient is in no acute distress. Patient is alert and oriented.  weight is 167 lb 14.4 oz (76.2 kg). His oral temperature is 97.7 F (36.5 C). His blood pressure is 136/86 and his pulse is 94. His respiration is 16 and oxygen saturation is 100%. .  No significant changes.  Lab Findings: Lab Results  Component Value Date   WBC 3.5 (L) 05/13/2016   WBC 8.4 03/04/2016   HGB 9.8 (L) 05/13/2016  HCT 28.8 (L) 05/13/2016   PLT 145 05/13/2016    Lab Results  Component Value Date   NA 134 (L) 05/13/2016   K 3.9 05/13/2016   CHLORIDE 99 05/13/2016   CO2 25 05/13/2016   GLUCOSE 95 05/13/2016   GLUCOSE 100 (H) 01/15/2013   BUN 11.8 05/13/2016   CREATININE 0.7 05/13/2016   BILITOT 0.87 05/13/2016   ALKPHOS 62 05/13/2016   AST 21 05/13/2016   ALT 34 05/13/2016   PROT 6.5 05/13/2016   ALBUMIN 3.3 (L) 05/13/2016   CALCIUM 9.3 05/13/2016   ANIONGAP 10 05/13/2016   ANIONGAP 10 03/04/2016    Radiographic Findings: Dg Inject Diag/thera/inc Needle/cath/plc Epi/lumb/sac W/img  Result Date: 05/08/2016 CLINICAL DATA:  Spondylosis without myelopathy. Low back and left leg pain. Patient  got good relief from previous interlaminar epidural and nerve root approach, but quicker relief from the interlaminar approach. FLUOROSCOPY TIME:  0 minutes 42 seconds. 44.28 micro gray meter squared PROCEDURE: The procedure, risks, benefits, and alternatives were explained to the patient. Questions regarding the procedure were encouraged and answered. The patient understands and consents to the procedure. LUMBAR EPIDURAL INJECTION: An interlaminar approach was performed on left at L3-4. The overlying skin was cleansed and anesthetized. A 20 gauge epidural needle was advanced using loss-of-resistance technique. DIAGNOSTIC EPIDURAL INJECTION: Injection of Isovue-M 200 shows a good epidural pattern with spread above and below the level of needle placement, primarily on the left. No vascular opacification is seen. THERAPEUTIC EPIDURAL INJECTION: One hundred twenty Mg of Depo-Medrol mixed with 2 cc 1% lidocaine were instilled. The procedure was well-tolerated, and the patient was discharged thirty minutes following the injection in good condition. COMPLICATIONS: None IMPRESSION: Technically successful epidural injection on the left at L3-4. Electronically Signed   By: Nelson Chimes M.D.   On: 05/08/2016 14:17    Impression:  The patient is recovering from the effects of radiation.   Plan:  Follow up MRI brain in three months.  We will also establish follow-up imagine of lumbar spine given post-injection activity seen on PET there.  _____________________________________  Sheral Apley. Tammi Klippel, M.D. This document serves as a record of services personally performed by Tyler Pita, MD. It was created on his behalf by Bethann Humble, a trained medical scribe. The creation of this record is based on the scribe's personal observations and the provider's statements to them. This document has been checked and approved by the attending provider.

## 2016-05-13 NOTE — Patient Instructions (Signed)
[facility] Discharge Instructions for Patients Receiving Chemotherapy  Today you received the following chemotherapy agents: Abraxane and Carboplatin.  To help prevent nausea and vomiting after your treatment, we encourage you to take your nausea medication as directed by your doctor.. If you develop nausea and vomiting that is not controlled by your nausea medication, call the clinic.   BELOW ARE SYMPTOMS THAT SHOULD BE REPORTED IMMEDIATELY:  *FEVER GREATER THAN 100.5 F  *CHILLS WITH OR WITHOUT FEVER  NAUSEA AND VOMITING THAT IS NOT CONTROLLED WITH YOUR NAUSEA MEDICATION  *UNUSUAL SHORTNESS OF BREATH  *UNUSUAL BRUISING OR BLEEDING  TENDERNESS IN MOUTH AND THROAT WITH OR WITHOUT PRESENCE OF ULCERS  *URINARY PROBLEMS  *BOWEL PROBLEMS  UNUSUAL RASH Items with * indicate a potential emergency and should be followed up as soon as possible.  Feel free to call the clinic you have any questions or concerns. The clinic phone number is [phone].  Please show the [facility] at check-in to the Emergency Department and triage nurse.

## 2016-05-14 NOTE — Addendum Note (Signed)
Encounter addended by: Heywood Footman, RN on: 05/14/2016 11:14 AM<BR>    Actions taken: Charge Capture section accepted

## 2016-05-15 ENCOUNTER — Other Ambulatory Visit: Payer: Medicare Other

## 2016-05-21 ENCOUNTER — Encounter: Payer: Self-pay | Admitting: *Deleted

## 2016-05-21 NOTE — Progress Notes (Signed)
Oncology Nurse Navigator Documentation  Oncology Nurse Navigator Flowsheets 05/21/2016  Navigator Encounter Type Telephone/I received a call from Ms. Boeding today.  She states her husband has many concerns.  She updated me that he has back pain, weakness in legs, fingers numb on both hands, increase in cough and difficulty swallowing.  She states she is not sure whom to call.  I updated Dr. Julien Nordmann and he asked that patient see symptom management.  I was notified that there are no providers for symptoms management.  I updated Dr. Julien Nordmann.  He stated patient needs to be seen in ED.  I called Ms. Kwiecinski back to update her.  I listened as she explained her husband would not like to do that.  I stated this was Dr. Worthy Flank recommendations.  She stated she will try to get him there.  I stated I am here to help if needed.  She was thankful for the help.   Telephone Incoming Call  Abnormal Finding Date 02/14/2016  Confirmed Diagnosis Date 03/04/2016  Treatment Initiated Date 03/13/2016  Treatment Phase Treatment  Barriers/Navigation Needs Coordination of Care  Interventions Other;Coordination of Care;Education Method  Coordination of Care Other  Education Method Verbal  Acuity Level 2  Acuity Level 2 Other;Assistance expediting appointments;Educational needs  Time Spent with Patient 30

## 2016-05-24 ENCOUNTER — Encounter: Payer: Self-pay | Admitting: Pharmacist

## 2016-05-24 ENCOUNTER — Ambulatory Visit (HOSPITAL_COMMUNITY)
Admission: RE | Admit: 2016-05-24 | Discharge: 2016-05-24 | Disposition: A | Discharge status: Home / Self Care | Payer: Medicare Other | Source: Ambulatory Visit | Attending: Internal Medicine | Admitting: Internal Medicine

## 2016-05-24 ENCOUNTER — Encounter (HOSPITAL_COMMUNITY): Payer: Self-pay

## 2016-05-24 DIAGNOSIS — R918 Other nonspecific abnormal finding of lung field: Secondary | ICD-10-CM | POA: Insufficient documentation

## 2016-05-24 DIAGNOSIS — R590 Localized enlarged lymph nodes: Secondary | ICD-10-CM | POA: Insufficient documentation

## 2016-05-24 DIAGNOSIS — C3492 Malignant neoplasm of unspecified part of left bronchus or lung: Secondary | ICD-10-CM | POA: Diagnosis not present

## 2016-05-24 DIAGNOSIS — Z5111 Encounter for antineoplastic chemotherapy: Secondary | ICD-10-CM

## 2016-05-24 DIAGNOSIS — E278 Other specified disorders of adrenal gland: Secondary | ICD-10-CM | POA: Insufficient documentation

## 2016-05-24 DIAGNOSIS — D175 Benign lipomatous neoplasm of intra-abdominal organs: Secondary | ICD-10-CM | POA: Insufficient documentation

## 2016-05-24 MED ORDER — IOPAMIDOL (ISOVUE-300) INJECTION 61%
100.0000 mL | Freq: Once | INTRAVENOUS | Status: AC | PRN
  Administered 2016-05-24: 100 mL via INTRAVENOUS

## 2016-05-27 ENCOUNTER — Other Ambulatory Visit: Payer: Self-pay | Admitting: *Deleted

## 2016-05-27 ENCOUNTER — Ambulatory Visit (HOSPITAL_BASED_OUTPATIENT_CLINIC_OR_DEPARTMENT_OTHER): Payer: Medicare Other

## 2016-05-27 ENCOUNTER — Ambulatory Visit (HOSPITAL_BASED_OUTPATIENT_CLINIC_OR_DEPARTMENT_OTHER): Payer: Medicare Other | Admitting: Internal Medicine

## 2016-05-27 ENCOUNTER — Other Ambulatory Visit (HOSPITAL_BASED_OUTPATIENT_CLINIC_OR_DEPARTMENT_OTHER): Payer: Medicare Other

## 2016-05-27 ENCOUNTER — Telehealth: Payer: Self-pay | Admitting: Internal Medicine

## 2016-05-27 ENCOUNTER — Encounter: Payer: Self-pay | Admitting: Internal Medicine

## 2016-05-27 ENCOUNTER — Other Ambulatory Visit: Payer: Self-pay | Admitting: Internal Medicine

## 2016-05-27 VITALS — BP 103/79 | HR 109 | Temp 97.8°F | Resp 18 | Ht 69.0 in | Wt 167.5 lb

## 2016-05-27 DIAGNOSIS — C3492 Malignant neoplasm of unspecified part of left bronchus or lung: Secondary | ICD-10-CM

## 2016-05-27 DIAGNOSIS — M549 Dorsalgia, unspecified: Secondary | ICD-10-CM

## 2016-05-27 DIAGNOSIS — D649 Anemia, unspecified: Secondary | ICD-10-CM

## 2016-05-27 DIAGNOSIS — Z85038 Personal history of other malignant neoplasm of large intestine: Secondary | ICD-10-CM | POA: Diagnosis not present

## 2016-05-27 DIAGNOSIS — D6481 Anemia due to antineoplastic chemotherapy: Secondary | ICD-10-CM | POA: Diagnosis not present

## 2016-05-27 DIAGNOSIS — D61810 Antineoplastic chemotherapy induced pancytopenia: Secondary | ICD-10-CM

## 2016-05-27 DIAGNOSIS — Z5111 Encounter for antineoplastic chemotherapy: Secondary | ICD-10-CM

## 2016-05-27 DIAGNOSIS — C342 Malignant neoplasm of middle lobe, bronchus or lung: Secondary | ICD-10-CM

## 2016-05-27 DIAGNOSIS — C7951 Secondary malignant neoplasm of bone: Secondary | ICD-10-CM | POA: Diagnosis not present

## 2016-05-27 DIAGNOSIS — R1310 Dysphagia, unspecified: Secondary | ICD-10-CM | POA: Diagnosis not present

## 2016-05-27 DIAGNOSIS — C7931 Secondary malignant neoplasm of brain: Secondary | ICD-10-CM

## 2016-05-27 HISTORY — DX: Dysphagia, unspecified: R13.10

## 2016-05-27 LAB — CBC WITH DIFFERENTIAL/PLATELET
BASO%: 0.2 % (ref 0.0–2.0)
Basophils Absolute: 0 10*3/uL (ref 0.0–0.1)
EOS%: 0 % (ref 0.0–7.0)
Eosinophils Absolute: 0 10*3/uL (ref 0.0–0.5)
HCT: 23.3 % — ABNORMAL LOW (ref 38.4–49.9)
HEMOGLOBIN: 8 g/dL — AB (ref 13.0–17.1)
LYMPH%: 10.3 % — AB (ref 14.0–49.0)
MCH: 31.9 pg (ref 27.2–33.4)
MCHC: 34.3 g/dL (ref 32.0–36.0)
MCV: 92.8 fL (ref 79.3–98.0)
MONO#: 0.3 10*3/uL (ref 0.1–0.9)
MONO%: 6.5 % (ref 0.0–14.0)
NEUT%: 83 % — ABNORMAL HIGH (ref 39.0–75.0)
NEUTROS ABS: 4 10*3/uL (ref 1.5–6.5)
Platelets: 113 10*3/uL — ABNORMAL LOW (ref 140–400)
RBC: 2.51 10*6/uL — AB (ref 4.20–5.82)
RDW: 21.7 % — AB (ref 11.0–14.6)
WBC: 4.8 10*3/uL (ref 4.0–10.3)
lymph#: 0.5 10*3/uL — ABNORMAL LOW (ref 0.9–3.3)

## 2016-05-27 LAB — COMPREHENSIVE METABOLIC PANEL
ALT: 21 U/L (ref 0–55)
AST: 14 U/L (ref 5–34)
Albumin: 2.7 g/dL — ABNORMAL LOW (ref 3.5–5.0)
Alkaline Phosphatase: 142 U/L (ref 40–150)
Anion Gap: 10 mEq/L (ref 3–11)
BILIRUBIN TOTAL: 0.76 mg/dL (ref 0.20–1.20)
BUN: 5.7 mg/dL — AB (ref 7.0–26.0)
CO2: 24 meq/L (ref 22–29)
Calcium: 8.9 mg/dL (ref 8.4–10.4)
Chloride: 98 mEq/L (ref 98–109)
Creatinine: 0.7 mg/dL (ref 0.7–1.3)
GLUCOSE: 107 mg/dL (ref 70–140)
Potassium: 3.8 mEq/L (ref 3.5–5.1)
SODIUM: 132 meq/L — AB (ref 136–145)
TOTAL PROTEIN: 6.5 g/dL (ref 6.4–8.3)

## 2016-05-27 LAB — PREPARE RBC (CROSSMATCH)

## 2016-05-27 MED ORDER — ACETAMINOPHEN 325 MG PO TABS
650.0000 mg | ORAL_TABLET | Freq: Once | ORAL | Status: AC
  Administered 2016-05-27: 650 mg via ORAL

## 2016-05-27 MED ORDER — ACETAMINOPHEN 325 MG PO TABS
ORAL_TABLET | ORAL | Status: AC
  Filled 2016-05-27: qty 2

## 2016-05-27 MED ORDER — SODIUM CHLORIDE 0.9 % IV SOLN
250.0000 mL | Freq: Once | INTRAVENOUS | Status: AC
  Administered 2016-05-27: 250 mL via INTRAVENOUS

## 2016-05-27 MED ORDER — SUCRALFATE 1 G PO TABS: 1.0000 g | ORAL_TABLET | Freq: Three times a day (TID) | ORAL | 2 refills | Status: DC

## 2016-05-27 MED ORDER — OXYCODONE HCL 5 MG PO TABS: 5.0000 mg | ORAL_TABLET | Freq: Four times a day (QID) | ORAL | 0 refills | Status: DC | PRN

## 2016-05-27 MED ORDER — SUCRALFATE 1 G PO TABS: 1.0000 g | ORAL_TABLET | Freq: Three times a day (TID) | ORAL | Status: DC

## 2016-05-27 NOTE — Telephone Encounter (Signed)
Avs report and appt schd given per 05/27/16 los.

## 2016-05-27 NOTE — Patient Instructions (Signed)
Blood Transfusion   A blood transfusion is a procedure that gives you donated blood through an IV tube. You may need blood because of illness, surgery, or injury. The blood may come from a donor. The blood may also be your own blood that you donated earlier.  The blood you get is made up of different types of cells. You may get:    Red blood cells. These carry oxygen and replace lost blood.    Platelets. These control bleeding.    Plasma. This helps blood to clot.  If you have a clotting disorder, you may also get other types of blood products.   BEFORE THE PROCEDURE   You may have a blood test. This finds out what type of blood you have. It also finds out what kind of blood your body will accept.    If you are going to have a planned surgery, you may donate your own blood. This is done in case you need to have a transfusion.    If you have had an allergic transfusion reaction before, you may be given medicine to help prevent a reaction. Take this medicine only as told by your doctor.   You will have your temperature, blood pressure, and pulse checked.  PROCEDURE    An IV will be started in your hand or arm.    The bag of donated blood will be attached to your IV and run into your vein.    A doctor will regularly check your temperature, blood pressure, and pulse during the procedure. This is done to find any early signs of a transfusion reaction.   If you have any signs or symptoms of a reaction, the procedure may be stopped and you may be given medicine.    When the transfusion is over, your IV will be removed.    Pressure may be applied to the IV site for a few minutes.    A bandage (dressing) will be applied.   The procedure may vary among doctors and hospitals.   AFTER THE PROCEDURE   Your blood pressure, temperature, and pulse will be checked regularly.     This information is not intended to replace advice given to you by your health care provider. Make sure you discuss any questions  you have with your health care provider.     Document Released: 11/29/2008 Document Revised: 09/23/2014 Document Reviewed: 07/13/2014  Elsevier Interactive Patient Education 2016 [employer]

## 2016-05-27 NOTE — Progress Notes (Signed)
[facility] [phone]   [phone]  OFFICE PROGRESS NOTE  [name] Kluver, MD [place] [address]  DIAGNOSIS:  1) Stage IV (T3, T3, M1b) non-small cell lung cancer, squamous cell carcinoma (PDL1 expression 40%), presented with large left lower lobe lung mass in addition to pulmonary nodule in the right upper lobe as well as multiple solid and cavitary nodules bilaterally in addition to mediastinal, right supraclavicular and right axillary lymphadenopathy as well as bone and brain metastasis, diagnosed in June 2017. PDL 1 expression is a still pending.  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 (Pathologic T3, N0, M0). 1.5 cm primary. No vascular or lymphatic invasion.  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 4.2 cm cavitary right middle lobe lesion with associated right hilar adenopathy which was PET avid.   PRIOR THERAPY: 1) status post right hemicolectomy 06/09/2002. (Pathologic T3, N0, M0). 1.5 cm primary. No vascular or lymphatic invasion followed by  adjuvant chemotherapy with 6 cycles of 5-fluorouracil plus leucovorin. 2) status post right middle lobe resection by Dr. Arlyce Dice on 08/20/2007 following neoadjuvant chemoradiation. He received weekly carboplatin and Taxol during radiation. Radiation given between August 4 and 06/08/2007. 36 gray to the primary tumor. Boost to 45 gray. 2 additional soft tissue deposits in the right middle lobe satellite lesions versus second primary also treated with a final total tumor dose of 65 gray.  CURRENT THERAPY: Systemic chemotherapy with carboplatin for AUC of 5 on day 1 and Abraxane 100 MG/M2 on days 1 and 8 every 3 weeks. First dose 03/25/2016.  Status post 3 cycles.  INTERVAL HISTORY: [name] [name] 70 y.o. male returns to the clinic today for followup visit accompanied by his wife. The patient continues to complain of fatigue and low back pain. He was seen by his Dr. Saintclair Halsted and received steroid injection to the back with minimal improvement. He also continues to have dysphagia and currently on treatment with Prilosec. He is tolerating his systemic chemotherapy with carboplatin and Abraxane fairly well with no significant adverse effects except for mild peripheral neuropathy and fatigue. He denied having any significant nausea or vomiting. He has no fever or chills. He denied having any significant chest pain, shortness of breath, cough or hemoptysis. He had repeat CT scan of the chest, abdomen and pelvis performed recently and he is here for evaluation and discussion of his scan results.  MEDICAL HISTORY: Past Medical History:  Diagnosis Date  . Adrenal insufficiency ([facility])   . Bone cancer ([facility])    L5 bone metastasis  . BPH (benign prostatic hyperplasia) 01/24/2013  . Brain cancer ([facility])    4 mm solitary brain metastasis  . Cancer ([facility])    lung/colon ca  . Cancer of middle lobe of lung ([facility]) 01/24/2013   4.5 cm RML w associated R hilar adenopathy S/P chemo/RT then Surgery 8/08  . Chronic back pain   . Collagen vascular disease ([facility])    RA  . Colon cancer ([facility]) 01/24/2013   T3N0M0  right colon  R hemicolectomy   . Encounter for antineoplastic chemotherapy 03/18/2016  . GERD (gastroesophageal reflux disease)   . Headache   . HOH (hard of hearing)   . Lung mass   . PONV (postoperative nausea and  vomiting)    adrenal insufficiency; needs steriod prior to procedures  . Raynauds phenomenon   . Rheumatoid arthritis(714.0) 01/24/2013   Since age 17    ALLERGIES:  is allergic to aspirin; diphenhydramine hcl; and remicade [infliximab].  MEDICATIONS:  Current Outpatient Prescriptions  Medication Sig Dispense Refill  . acetaminophen  (TYLENOL) 500 MG tablet Take 1,500 mg by mouth every 6 (six) hours as needed (For pain.). Reported on 03/06/2016    . amLODipine (NORVASC) 2.5 MG tablet Take 2.5 mg by mouth daily with lunch.     . Calcium Carbonate-Vitamin D (CALCIUM + D PO) Take 1 tablet by mouth daily.     Marland Kitchen leflunomide (ARAVA) 10 MG tablet Take 10 mg by mouth daily.     Marland Kitchen LORazepam (ATIVAN) 0.5 MG tablet Take one tab po 30 minutes prior to MRI scans or radiation treatment (Patient not taking: Reported on 05/13/2016) 30 tablet 0  . omeprazole (PRILOSEC) 20 MG capsule Take 20 mg by mouth daily.     Marland Kitchen oxyCODONE (OXY IR/ROXICODONE) 5 MG immediate release tablet Take 1 tablet (5 mg total) by mouth every 6 (six) hours as needed for severe pain. 30 tablet 0  . predniSONE (DELTASONE) 5 MG tablet Take 5 mg by mouth daily with breakfast.    . prochlorperazine (COMPAZINE) 10 MG tablet Take 1 tablet (10 mg total) by mouth every 6 (six) hours as needed for nausea or vomiting. (Patient not taking: Reported on 05/13/2016) 30 tablet 0  . vitamin C (ASCORBIC ACID) 500 MG tablet Take 500 mg by mouth daily.    . vitamin E 400 UNIT capsule Take 400 Units by mouth daily.     No current facility-administered medications for this visit.     SURGICAL HISTORY:  Past Surgical History:  Procedure Laterality Date  . APPENDECTOMY    . COLECTOMY  2004   right-hemi  . CYSTOSCOPY N/A 10/19/2015   Procedure: CYSTOSCOPY;  Surgeon: Carolan Clines, MD;  Location: [facility];  Service: Urology;  Laterality: N/A;  . GASTRECTOMY  1974   bleeding ulcers  . INGUINAL HERNIA REPAIR Right 06/03/2014   Procedure: HERNIA REPAIR INGUINAL ADULT;  Surgeon: Kaylyn Lim, MD;  Location: [facility];  Service: General;  Laterality: Right;  . [place] SURGERY  2003  . lung lobectomy  2007   right  . tendons ruptured  2010   right-hand  . TRANSURETHRAL RESECTION OF PROSTATE N/A 10/19/2015   Procedure: TRANSURETHRAL RESECTION OF THE PROSTATE (TURP);  Surgeon:  Carolan Clines, MD;  Location: [facility];  Service: Urology;  Laterality: N/A;  . VIDEO BRONCHOSCOPY WITH ENDOBRONCHIAL NAVIGATION N/A 03/04/2016   Procedure: VIDEO BRONCHOSCOPY WITH BRUSHINGS AND BIOPSY;  Surgeon: Grace Isaac, MD;  Location: [facility];  Service: Thoracic;  Laterality: N/A;    REVIEW OF SYSTEMS:  Constitutional: positive for fatigue Eyes: negative Ears, nose, mouth, throat, and face: negative Respiratory: positive for dyspnea on exertion Cardiovascular: negative Gastrointestinal: positive for dysphagia Genitourinary:negative Integument/breast: negative Hematologic/lymphatic: negative Musculoskeletal:positive for back pain Neurological: negative Behavioral/Psych: negative Endocrine: negative Allergic/Immunologic: negative   PHYSICAL EXAMINATION: General appearance: alert, cooperative and no distress Head: Normocephalic, without obvious abnormality, atraumatic Neck: no adenopathy, no JVD, supple, symmetrical, trachea midline and thyroid not enlarged, symmetric, no tenderness/mass/nodules Lymph nodes: Cervical, supraclavicular, and axillary nodes normal. Resp: clear to auscultation bilaterally Back: symmetric, no curvature. ROM normal. No CVA tenderness. Cardio: regular rate and rhythm, S1, S2 normal, no murmur, click, rub or gallop GI: soft, non-tender;  bowel sounds normal; no masses,  no organomegaly Extremities: extremities normal, atraumatic, no cyanosis or edema Neurologic: Alert and oriented X 3, normal strength and tone. Normal symmetric reflexes. Normal coordination and gait  ECOG PERFORMANCE STATUS: 1 - Symptomatic but completely ambulatory  There were no vitals taken for this visit.  LABORATORY DATA: Lab Results  Component Value Date   WBC 4.8 05/27/2016   HGB 8.0 (L) 05/27/2016   HCT 23.3 (L) 05/27/2016   MCV 92.8 05/27/2016   PLT 113 (L) 05/27/2016      Chemistry      Component Value Date/Time   NA 134 (L) 05/13/2016 1056   K 3.9 05/13/2016  1056   CL 103 03/04/2016 1147   CL 98 01/15/2013 1454   CO2 25 05/13/2016 1056   BUN 11.8 05/13/2016 1056   CREATININE 0.7 05/13/2016 1056      Component Value Date/Time   CALCIUM 9.3 05/13/2016 1056   ALKPHOS 62 05/13/2016 1056   AST 21 05/13/2016 1056   ALT 34 05/13/2016 1056   BILITOT 0.87 05/13/2016 1056       RADIOGRAPHIC STUDIES: Ct Chest W Contrast  Result Date: 05/24/2016 CLINICAL DATA:  Metastatic lung cancer to bones and brain. Increase cough and shortness of breath with weight loss and generalized weakness. EXAM: CT CHEST, ABDOMEN, AND PELVIS WITH CONTRAST TECHNIQUE: Multidetector CT imaging of the chest, abdomen and pelvis was performed following the standard protocol during bolus administration of intravenous contrast. CONTRAST:  136m ISOVUE-300 IOPAMIDOL (ISOVUE-300) INJECTION 61% COMPARISON:  PET-CT 03/01/2016. Chest abdomen and pelvis CT from 02/14/2016. FINDINGS: CT CHEST FINDINGS Cardiovascular: The heart size is normal. No pericardial effusion. Coronary artery calcification is noted. Atherosclerotic calcification is noted in the wall of the thoracic aorta. Mediastinum/Nodes: 10 mm short axis precarinal lymph node seen is hypermetabolic on most recent comparison study has decreased in the interval, now measuring 5 mm short axis. The paraesophageal lymph node measured previously is not detectable on the current exam. No left hilar lymphadenopathy. Postsurgical/treatment changes noted in the right hilum. The esophagus has normal imaging features. The small hypermetabolic right axillary lymph nodes seen on the previous PET-CT is not evident on today's exam. No evidence for axillary lymphadenopathy on either side by CT today. 7 mm right supraclavicular hypermetabolic lymph nodes seen on the PET-CT has decreased in the interval, measuring only 3 mm in short axis today. Lungs/Pleura: 2.3 x 1.2 cm lesion in the right apex has decreased substantially in the interval with slightly  different orientation, measuring 1.6 x 0.3 cm. Changes of prior surgery and radiation noted in the parahilar right lung. 9 mm right lung nodule seen on image 98 series 8 is stable since the PET-CT. A large left infrahilar lesion has decreased markedly in the interval, measuring 3.0 x 2.5 see BM today compared to approximately 5.0 by I 4.5 cm when I remeasure it on the prior PET-CT. 8 mm cavitary nodule peripheral left lower lobe has decreased, measuring 4 mm today on image 123 series 8. 9 mm cavitary nodule lateral right lung base (image 140 series 8) was 10 mm on the PET-CT. Musculoskeletal: Bone windows reveal no worrisome lytic or sclerotic osseous lesions. CT ABDOMEN PELVIS FINDINGS Hepatobiliary: No focal abnormality within the liver parenchyma. There is no evidence for gallstones, gallbladder wall thickening, or pericholecystic fluid. No intrahepatic or extrahepatic biliary dilation. Pancreas: No focal mass lesion. No dilatation of the main duct. No intraparenchymal cyst. No peripancreatic edema. Spleen: No splenomegaly. No focal mass  lesion. Adrenals/Urinary Tract: Bilateral tiny adrenal nodules are stable and showed no hypermetabolism on the PET-CT. These are likely benign adrenal adenomas. No enhancing abnormality is seen in either kidney. No evidence for hydroureter. The urinary bladder appears normal for the degree of distention. Stomach/Bowel: Surgical clips noted in the region of the esophagogastric junction. Stomach otherwise unremarkable. Duodenum is normally positioned as is the ligament of Treitz. No small bowel wall thickening. No small bowel dilatation. Patient is status post right hemicolectomy. No gross colonic mass. No colonic wall thickening. No substantial diverticular change. 2.2 cm fatty lesion in the descending colon, present previously, in compatible with colonic lipoma. Vascular/Lymphatic: There is abdominal aortic atherosclerosis without aneurysm. There is no gastrohepatic or  hepatoduodenal ligament lymphadenopathy. No intraperitoneal or retroperitoneal lymphadenopathy. No pelvic sidewall lymphadenopathy. Reproductive: Central defect in the prostate gland suggest previous TURP. Other: No intraperitoneal free fluid. Musculoskeletal: Bone windows reveal no worrisome lytic or sclerotic osseous lesions. IMPRESSION: 1. Interval response to therapy in the chest. Right supraclavicular, axillary, and mediastinal lymphadenopathy has decreased in the interval and the right axillary lymph node is no longer detectable. 2. Right upper lobe pulmonary nodule and left infrahilar necrotic lesion also show marked interval decrease in size since previous PET-CT. 3. Bibasilar tiny cavitary nodules in the lungs are smaller in the interval. 4. No new or progressive findings in the chest, abdomen, or pelvis. 5. Stable appearance of bilateral adrenal nodules, most suggestive of benign adenomas. 6. Stable appearance left colonic lipoma. Electronically Signed   By: Misty Stanley M.D.   On: 05/24/2016 15:33   Ct Abdomen Pelvis W Contrast  Result Date: 05/24/2016 CLINICAL DATA:  Metastatic lung cancer to bones and brain. Increase cough and shortness of breath with weight loss and generalized weakness. EXAM: CT CHEST, ABDOMEN, AND PELVIS WITH CONTRAST TECHNIQUE: Multidetector CT imaging of the chest, abdomen and pelvis was performed following the standard protocol during bolus administration of intravenous contrast. CONTRAST:  132m ISOVUE-300 IOPAMIDOL (ISOVUE-300) INJECTION 61% COMPARISON:  PET-CT 03/01/2016. Chest abdomen and pelvis CT from 02/14/2016. FINDINGS: CT CHEST FINDINGS Cardiovascular: The heart size is normal. No pericardial effusion. Coronary artery calcification is noted. Atherosclerotic calcification is noted in the wall of the thoracic aorta. Mediastinum/Nodes: 10 mm short axis precarinal lymph node seen is hypermetabolic on most recent comparison study has decreased in the interval, now  measuring 5 mm short axis. The paraesophageal lymph node measured previously is not detectable on the current exam. No left hilar lymphadenopathy. Postsurgical/treatment changes noted in the right hilum. The esophagus has normal imaging features. The small hypermetabolic right axillary lymph nodes seen on the previous PET-CT is not evident on today's exam. No evidence for axillary lymphadenopathy on either side by CT today. 7 mm right supraclavicular hypermetabolic lymph nodes seen on the PET-CT has decreased in the interval, measuring only 3 mm in short axis today. Lungs/Pleura: 2.3 x 1.2 cm lesion in the right apex has decreased substantially in the interval with slightly different orientation, measuring 1.6 x 0.3 cm. Changes of prior surgery and radiation noted in the parahilar right lung. 9 mm right lung nodule seen on image 98 series 8 is stable since the PET-CT. A large left infrahilar lesion has decreased markedly in the interval, measuring 3.0 x 2.5 see BM today compared to approximately 5.0 by I 4.5 cm when I remeasure it on the prior PET-CT. 8 mm cavitary nodule peripheral left lower lobe has decreased, measuring 4 mm today on image 123 series 8. 9 mm  cavitary nodule lateral right lung base (image 140 series 8) was 10 mm on the PET-CT. Musculoskeletal: Bone windows reveal no worrisome lytic or sclerotic osseous lesions. CT ABDOMEN PELVIS FINDINGS Hepatobiliary: No focal abnormality within the liver parenchyma. There is no evidence for gallstones, gallbladder wall thickening, or pericholecystic fluid. No intrahepatic or extrahepatic biliary dilation. Pancreas: No focal mass lesion. No dilatation of the main duct. No intraparenchymal cyst. No peripancreatic edema. Spleen: No splenomegaly. No focal mass lesion. Adrenals/Urinary Tract: Bilateral tiny adrenal nodules are stable and showed no hypermetabolism on the PET-CT. These are likely benign adrenal adenomas. No enhancing abnormality is seen in either  kidney. No evidence for hydroureter. The urinary bladder appears normal for the degree of distention. Stomach/Bowel: Surgical clips noted in the region of the esophagogastric junction. Stomach otherwise unremarkable. Duodenum is normally positioned as is the ligament of Treitz. No small bowel wall thickening. No small bowel dilatation. Patient is status post right hemicolectomy. No gross colonic mass. No colonic wall thickening. No substantial diverticular change. 2.2 cm fatty lesion in the descending colon, present previously, in compatible with colonic lipoma. Vascular/Lymphatic: There is abdominal aortic atherosclerosis without aneurysm. There is no gastrohepatic or hepatoduodenal ligament lymphadenopathy. No intraperitoneal or retroperitoneal lymphadenopathy. No pelvic sidewall lymphadenopathy. Reproductive: Central defect in the prostate gland suggest previous TURP. Other: No intraperitoneal free fluid. Musculoskeletal: Bone windows reveal no worrisome lytic or sclerotic osseous lesions. IMPRESSION: 1. Interval response to therapy in the chest. Right supraclavicular, axillary, and mediastinal lymphadenopathy has decreased in the interval and the right axillary lymph node is no longer detectable. 2. Right upper lobe pulmonary nodule and left infrahilar necrotic lesion also show marked interval decrease in size since previous PET-CT. 3. Bibasilar tiny cavitary nodules in the lungs are smaller in the interval. 4. No new or progressive findings in the chest, abdomen, or pelvis. 5. Stable appearance of bilateral adrenal nodules, most suggestive of benign adenomas. 6. Stable appearance left colonic lipoma. Electronically Signed   By: Misty Stanley M.D.   On: 05/24/2016 15:33   Dg Inject Diag/thera/inc Needle/cath/plc Epi/lumb/sac W/img  Result Date: 05/08/2016 CLINICAL DATA:  Spondylosis without myelopathy. Low back and left leg pain. Patient got good relief from previous interlaminar epidural and nerve root  approach, but quicker relief from the interlaminar approach. FLUOROSCOPY TIME:  0 minutes 42 seconds. 44.28 micro gray meter squared PROCEDURE: The procedure, risks, benefits, and alternatives were explained to the patient. Questions regarding the procedure were encouraged and answered. The patient understands and consents to the procedure. LUMBAR EPIDURAL INJECTION: An interlaminar approach was performed on left at L3-4. The overlying skin was cleansed and anesthetized. A 20 gauge epidural needle was advanced using loss-of-resistance technique. DIAGNOSTIC EPIDURAL INJECTION: Injection of Isovue-M 200 shows a good epidural pattern with spread above and below the level of needle placement, primarily on the left. No vascular opacification is seen. THERAPEUTIC EPIDURAL INJECTION: One hundred twenty Mg of Depo-Medrol mixed with 2 cc 1% lidocaine were instilled. The procedure was well-tolerated, and the patient was discharged thirty minutes following the injection in good condition. COMPLICATIONS: None IMPRESSION: Technically successful epidural injection on the left at L3-4. Electronically Signed   By: Nelson Chimes M.D.   On: 05/08/2016 14:17   ASSESSMENT AND PLAN: This is a very pleasant 70 years old white male with  1) history of stage II colon adenocarcinoma diagnosed in 2003   2) history stage III non-small cell lung cancer diagnosed in 2008 status post dissection as well as  adjuvant therapy. He has been observation since that time with no significant evidence for disease recurrence.  3) recurrent and metastatic ( stage IV) non-small cell lung cancer, squamous cell carcinoma, PDL1 expression 40%, diagnosed in June 2017 when the patient presented for routine annual exam and his blood work showed elevation of CEA up to 11.3. Last year it was 4.1.  I ordered repeat CT scan of the chest, abdomen and pelvis which was performed a few days ago and unfortunately it showed multiple new pulmonary nodules and masses  concerning for either metastatic disease to the lung or multiple new synchronous primaries. There was also what appears to be an erosion through the inferior wall of the proximal left mainstem bronchus with a small amount of gas in the middle mediastinum and the subcarinal region. There was also evidence of subcarinal and lower right paratracheal lymphadenopathy. The scan also showed fat containing lesion in the distal descending colon questionable for colonic lipoma or lipomatous polyp. This was followed by a PET scan which showed hypermetabolic activity in the left lower lobe lung mass in addition to the right upper lobe pulmonary nodule as well as multiple solid and cavitary bilateral pulmonary nodules and suspicious L5 bone metastasis. 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 The patient underwent bronchoscopy under the care of Dr. Servando Snare and the final pathology is consistent with squamous cell carcinoma. He completed stereotactic radiotherapy to the solitary brain lesion under the care of Dr. Tammi Klippel.  His currently on systemic chemotherapy with carboplatin and Abraxane status post 3 cycles and tolerated the last cycle of his treatment fairly well except for fatigue. The recent CT scan of the chest, abdomen and pelvis showed improvement of his disease. I discussed the scan results with the patient his wife. I recommended for him to continue his current treatment with carboplatin and Abraxane but we will start cycle #4 next week secondary to pancytopenia. For the chemotherapy-induced anemia, I will arrange for the patient to receive 2 units of PRBCs transfusion this week. For the back  pain, the recent CT scan of the chest, abdomen and pelvis showed no evidence for bone disease. His back pain could be secondary to osteoarthritis or sciatica. he will continue on oxycodone 5 mg by mouth every 6 hours as needed. He will also contact Dr. Saintclair Halsted for reevaluation. He may need  MRI of the lumbar sacral spines for evaluation and if no improvement in his condition. For the dysphagia, I will start the patient on Carafate 1 g by mouth 4 times a day as needed and if no improvement, I may refer the patient to gastroenterology for evaluation.  I will see him back for follow-up visit in 4 weeks for evaluation before starting cycle #5. The patient and his wife agreed to the current plan. He was advised to call immediately if he has any concerning symptoms in the interval. The patient voices understanding of current disease status and treatment options and is in agreement with the current care plan.  All questions were answered. The patient knows to call the clinic with any problems, questions or concerns. We can certainly see the patient much sooner if necessary.  Disclaimer: This note was dictated with voice recognition software. Similar sounding words can inadvertently be transcribed and may not be corrected upon review.

## 2016-05-27 NOTE — Progress Notes (Signed)
Patient made aware that blood is ready but would not be finished until approx. 7pm. Pt and wife state they are tired and would like to go home. Pt to receive two units PRBCs tomorrow; also, educated to keep blue bracelet on for tomorrow; both voice understanding.

## 2016-05-28 ENCOUNTER — Other Ambulatory Visit: Payer: Self-pay | Admitting: *Deleted

## 2016-05-28 ENCOUNTER — Ambulatory Visit (HOSPITAL_COMMUNITY)
Admission: RE | Admit: 2016-05-28 | Discharge: 2016-05-28 | Disposition: A | Discharge status: Home / Self Care | Payer: Medicare Other | Source: Ambulatory Visit | Attending: Internal Medicine | Admitting: Internal Medicine

## 2016-05-28 ENCOUNTER — Ambulatory Visit (HOSPITAL_BASED_OUTPATIENT_CLINIC_OR_DEPARTMENT_OTHER): Payer: Medicare Other

## 2016-05-28 VITALS — BP 128/86 | HR 96 | Temp 98.1°F | Resp 18

## 2016-05-28 DIAGNOSIS — C7931 Secondary malignant neoplasm of brain: Secondary | ICD-10-CM | POA: Insufficient documentation

## 2016-05-28 DIAGNOSIS — C7949 Secondary malignant neoplasm of other parts of nervous system: Secondary | ICD-10-CM | POA: Diagnosis present

## 2016-05-28 DIAGNOSIS — D649 Anemia, unspecified: Secondary | ICD-10-CM

## 2016-05-28 DIAGNOSIS — D6481 Anemia due to antineoplastic chemotherapy: Secondary | ICD-10-CM

## 2016-05-28 LAB — PREPARE RBC (CROSSMATCH)

## 2016-05-28 MED ORDER — ACETAMINOPHEN 325 MG PO TABS
650.0000 mg | ORAL_TABLET | Freq: Once | ORAL | Status: AC
  Administered 2016-05-28: 650 mg via ORAL

## 2016-05-28 MED ORDER — SODIUM CHLORIDE 0.9 % IV SOLN
250.0000 mL | Freq: Once | INTRAVENOUS | Status: AC
  Administered 2016-05-28: 250 mL via INTRAVENOUS

## 2016-05-28 MED ORDER — ACETAMINOPHEN 325 MG PO TABS
ORAL_TABLET | ORAL | Status: AC
  Filled 2016-05-28: qty 2

## 2016-05-28 NOTE — Patient Instructions (Signed)

## 2016-05-29 ENCOUNTER — Other Ambulatory Visit: Payer: Self-pay | Admitting: Neurosurgery

## 2016-05-29 DIAGNOSIS — M5416 Radiculopathy, lumbar region: Secondary | ICD-10-CM

## 2016-05-29 LAB — TYPE AND SCREEN
ABO/RH(D): A POS
Antibody Screen: NEGATIVE
Unit division: 0
Unit division: 0

## 2016-06-03 ENCOUNTER — Other Ambulatory Visit: Payer: Self-pay | Admitting: *Deleted

## 2016-06-03 ENCOUNTER — Other Ambulatory Visit (HOSPITAL_BASED_OUTPATIENT_CLINIC_OR_DEPARTMENT_OTHER): Payer: Medicare Other

## 2016-06-03 ENCOUNTER — Ambulatory Visit (HOSPITAL_BASED_OUTPATIENT_CLINIC_OR_DEPARTMENT_OTHER): Payer: Medicare Other

## 2016-06-03 VITALS — BP 138/87 | HR 96 | Temp 98.4°F | Resp 18

## 2016-06-03 DIAGNOSIS — C342 Malignant neoplasm of middle lobe, bronchus or lung: Secondary | ICD-10-CM

## 2016-06-03 DIAGNOSIS — C7949 Secondary malignant neoplasm of other parts of nervous system: Principal | ICD-10-CM

## 2016-06-03 DIAGNOSIS — Z5111 Encounter for antineoplastic chemotherapy: Secondary | ICD-10-CM | POA: Diagnosis present

## 2016-06-03 DIAGNOSIS — C3492 Malignant neoplasm of unspecified part of left bronchus or lung: Secondary | ICD-10-CM

## 2016-06-03 DIAGNOSIS — C7931 Secondary malignant neoplasm of brain: Secondary | ICD-10-CM

## 2016-06-03 DIAGNOSIS — C7951 Secondary malignant neoplasm of bone: Secondary | ICD-10-CM

## 2016-06-03 LAB — COMPREHENSIVE METABOLIC PANEL
ALT: 19 U/L (ref 0–55)
ANION GAP: 11 meq/L (ref 3–11)
AST: 16 U/L (ref 5–34)
Albumin: 2.5 g/dL — ABNORMAL LOW (ref 3.5–5.0)
Alkaline Phosphatase: 182 U/L — ABNORMAL HIGH (ref 40–150)
BILIRUBIN TOTAL: 0.55 mg/dL (ref 0.20–1.20)
BUN: 7.8 mg/dL (ref 7.0–26.0)
CALCIUM: 9.2 mg/dL (ref 8.4–10.4)
CO2: 24 meq/L (ref 22–29)
CREATININE: 0.8 mg/dL (ref 0.7–1.3)
Chloride: 99 mEq/L (ref 98–109)
EGFR: >90 mL/min/{1.73_m2} (ref >90)
Glucose: 122 mg/dl (ref 70–140)
Potassium: 3.8 mEq/L (ref 3.5–5.1)
Sodium: 133 mEq/L — ABNORMAL LOW (ref 136–145)
TOTAL PROTEIN: 7 g/dL (ref 6.4–8.3)

## 2016-06-03 LAB — CBC WITH DIFFERENTIAL/PLATELET
BASO%: 0.2 % (ref 0.0–2.0)
Basophils Absolute: 0 10*3/uL (ref 0.0–0.1)
EOS%: 0.5 % (ref 0.0–7.0)
Eosinophils Absolute: 0 10*3/uL (ref 0.0–0.5)
HEMATOCRIT: 27.7 % — AB (ref 38.4–49.9)
HGB: 9.5 g/dL — ABNORMAL LOW (ref 13.0–17.1)
LYMPH#: 0.5 10*3/uL — AB (ref 0.9–3.3)
LYMPH%: 11.4 % — ABNORMAL LOW (ref 14.0–49.0)
MCH: 31.9 pg (ref 27.2–33.4)
MCHC: 34.3 g/dL (ref 32.0–36.0)
MCV: 93 fL (ref 79.3–98.0)
MONO#: 0.5 10*3/uL (ref 0.1–0.9)
MONO%: 11.4 % (ref 0.0–14.0)
NEUT%: 76.5 % — ABNORMAL HIGH (ref 39.0–75.0)
NEUTROS ABS: 3.3 10*3/uL (ref 1.5–6.5)
PLATELETS: 182 10*3/uL (ref 140–400)
RBC: 2.98 10*6/uL — ABNORMAL LOW (ref 4.20–5.82)
RDW: 19.3 % — AB (ref 11.0–14.6)
WBC: 4.3 10*3/uL (ref 4.0–10.3)

## 2016-06-03 MED ORDER — SODIUM CHLORIDE 0.9 % IV SOLN
490.5000 mg | Freq: Once | INTRAVENOUS | Status: AC
  Administered 2016-06-03: 490 mg via INTRAVENOUS
  Filled 2016-06-03: qty 49

## 2016-06-03 MED ORDER — PALONOSETRON HCL INJECTION 0.25 MG/5ML
INTRAVENOUS | Status: AC
  Filled 2016-06-03: qty 5

## 2016-06-03 MED ORDER — SODIUM CHLORIDE 0.9 % IV SOLN
Freq: Once | INTRAVENOUS | Status: AC
  Administered 2016-06-03: 15:00:00 via INTRAVENOUS

## 2016-06-03 MED ORDER — SODIUM CHLORIDE 0.9 % IV SOLN
10.0000 mg | Freq: Once | INTRAVENOUS | Status: AC
  Administered 2016-06-03: 10 mg via INTRAVENOUS
  Filled 2016-06-03: qty 1

## 2016-06-03 MED ORDER — PALONOSETRON HCL INJECTION 0.25 MG/5ML
0.2500 mg | Freq: Once | INTRAVENOUS | Status: AC
  Administered 2016-06-03: 0.25 mg via INTRAVENOUS

## 2016-06-03 MED ORDER — PACLITAXEL PROTEIN-BOUND CHEMO INJECTION 100 MG
100.0000 mg/m2 | Freq: Once | INTRAVENOUS | Status: AC
  Administered 2016-06-03: 200 mg via INTRAVENOUS
  Filled 2016-06-03: qty 40

## 2016-06-03 NOTE — Patient Instructions (Addendum)
[facility] Discharge Instructions for Patients Receiving Chemotherapy  Today you received the following chemotherapy agents Abraxane and Carboplatin  To help prevent nausea and vomiting after your treatment, we encourage you to take your nausea medication as directed. No Zofran for 3 days, take Compazine instead.   If you develop nausea and vomiting that is not controlled by your nausea medication, call the clinic.   BELOW ARE SYMPTOMS THAT SHOULD BE REPORTED IMMEDIATELY:  *FEVER GREATER THAN 100.5 F  *CHILLS WITH OR WITHOUT FEVER  NAUSEA AND VOMITING THAT IS NOT CONTROLLED WITH YOUR NAUSEA MEDICATION  *UNUSUAL SHORTNESS OF BREATH  *UNUSUAL BRUISING OR BLEEDING  TENDERNESS IN MOUTH AND THROAT WITH OR WITHOUT PRESENCE OF ULCERS  *URINARY PROBLEMS  *BOWEL PROBLEMS  UNUSUAL RASH Items with * indicate a potential emergency and should be followed up as soon as possible.  Feel free to call the clinic you have any questions or concerns. The clinic phone number is [phone].  Please show the [facility] at check-in to the Emergency Department and triage nurse.

## 2016-06-04 ENCOUNTER — Telehealth: Payer: Self-pay | Admitting: *Deleted

## 2016-06-04 ENCOUNTER — Ambulatory Visit
Admission: RE | Admit: 2016-06-04 | Discharge: 2016-06-04 | Disposition: A | Discharge status: Home / Self Care | Payer: Medicare Other | Source: Ambulatory Visit | Attending: Neurosurgery | Admitting: Neurosurgery

## 2016-06-04 DIAGNOSIS — M5416 Radiculopathy, lumbar region: Secondary | ICD-10-CM

## 2016-06-04 MED ORDER — METHYLPREDNISOLONE ACETATE 40 MG/ML INJ SUSP (RADIOLOG
120.0000 mg | Freq: Once | INTRAMUSCULAR | Status: AC
  Administered 2016-06-04: 120 mg via EPIDURAL

## 2016-06-04 MED ORDER — IOPAMIDOL (ISOVUE-M 200) INJECTION 41%
1.0000 mL | Freq: Once | INTRAMUSCULAR | Status: AC
  Administered 2016-06-04: 1 mL via EPIDURAL

## 2016-06-04 NOTE — Telephone Encounter (Signed)
1.  Do you need a wheel chair?  NO  2. On oxygen? NO  3. Have you ever had any surgery in the body part being scanned?  NO  4. Have you ever had any surgery on your brain or heart?   NO    5. Have you ever had surgery on your eyes or ears?   NO        6. Do you have a pacemaker or defibrillator?  NO  7. Do you have a Neurostimulator? NO  8. Claustrophobic?  yes  9. Any risk for metal in eyes? NO  10. Injury by bullet, buckshot, or shrapnel?  NO  11. Stent?  NO  12. Hx of Cancer? NO  13. Kidney or Liver disease?  NO  14. Hx of Lupus, Rheumatoid Arthritis or Scleroderma? RA  15. IV Antibiotics or long term use of NSAIDS?  NO   16. HX of Hypertension?  NO  17. Diabetes?  NO  18. Allergy to contrast? NO

## 2016-06-04 NOTE — Discharge Instructions (Signed)

## 2016-06-06 ENCOUNTER — Emergency Department: Payer: Medicare Other

## 2016-06-06 ENCOUNTER — Encounter: Payer: Self-pay | Admitting: Emergency Medicine

## 2016-06-06 ENCOUNTER — Emergency Department
Admission: EM | Admit: 2016-06-06 | Discharge: 2016-06-06 | Disposition: A | Discharge status: Home / Self Care | Payer: Medicare Other | Attending: Emergency Medicine | Admitting: Emergency Medicine

## 2016-06-06 DIAGNOSIS — M25552 Pain in left hip: Secondary | ICD-10-CM | POA: Diagnosis not present

## 2016-06-06 DIAGNOSIS — W19XXXA Unspecified fall, initial encounter: Secondary | ICD-10-CM

## 2016-06-06 DIAGNOSIS — Y9389 Activity, other specified: Secondary | ICD-10-CM | POA: Insufficient documentation

## 2016-06-06 DIAGNOSIS — Y999 Unspecified external cause status: Secondary | ICD-10-CM | POA: Insufficient documentation

## 2016-06-06 DIAGNOSIS — Z87891 Personal history of nicotine dependence: Secondary | ICD-10-CM | POA: Diagnosis not present

## 2016-06-06 DIAGNOSIS — W010XXA Fall on same level from slipping, tripping and stumbling without subsequent striking against object, initial encounter: Secondary | ICD-10-CM | POA: Insufficient documentation

## 2016-06-06 DIAGNOSIS — C3492 Malignant neoplasm of unspecified part of left bronchus or lung: Secondary | ICD-10-CM | POA: Insufficient documentation

## 2016-06-06 DIAGNOSIS — I10 Essential (primary) hypertension: Secondary | ICD-10-CM | POA: Insufficient documentation

## 2016-06-06 DIAGNOSIS — Y9289 Other specified places as the place of occurrence of the external cause: Secondary | ICD-10-CM | POA: Diagnosis not present

## 2016-06-06 DIAGNOSIS — Z85830 Personal history of malignant neoplasm of bone: Secondary | ICD-10-CM | POA: Insufficient documentation

## 2016-06-06 DIAGNOSIS — Z85038 Personal history of other malignant neoplasm of large intestine: Secondary | ICD-10-CM | POA: Diagnosis not present

## 2016-06-06 DIAGNOSIS — Z79899 Other long term (current) drug therapy: Secondary | ICD-10-CM | POA: Insufficient documentation

## 2016-06-06 NOTE — ED Provider Notes (Signed)
[facility] Emergency Department Provider Note   ____________________________________________   First MD Initiated Contact with Patient 06/06/16 1245     (approximate)  I have reviewed the triage vital signs and the nursing notes.   HISTORY  Chief Complaint Chemo card (fall)   HPI [name] [name] is a 70 y.o. 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 He says that he got up in the middle the night last night and his "feet slipped out from under me." He says that he has had difficulty with his gait ever since having prostate surgery earlier this year. He has also had ongoing pain in his left hip and left buttock and is receiving steroid injections for this. He is concerned that he may have fractured his hip after his fall last night. However, he has been able to ambulate since the fall. He denies hitting his head or losing consciousness. He denies any headache at this time.   Past Medical History:  Diagnosis Date  . Adrenal insufficiency ([place])   . Bone cancer ([place])    L5 bone metastasis  . BPH (benign prostatic hyperplasia) 01/24/2013  . Brain cancer ([place])    4 mm solitary brain metastasis  . Cancer ([place])    lung/colon ca  . Cancer of middle lobe of lung ([place]) 01/24/2013   4.5 cm RML w associated R hilar adenopathy S/P chemo/RT then Surgery 8/08  . Chronic back pain   . Collagen vascular disease (HCC)    RA  . Colon cancer ([place]) 01/24/2013   T3N0M0  right colon  R hemicolectomy   . Dysphagia 05/27/2016  . Encounter for antineoplastic chemotherapy 03/18/2016  . GERD (gastroesophageal reflux disease)   . Headache   . HOH (hard of hearing)   . Lung mass   . PONV (postoperative nausea and vomiting)    adrenal insufficiency; needs steriod prior to procedures  . Raynauds phenomenon   . Rheumatoid arthritis(714.0) 01/24/2013   Since age 74    Patient Active Problem List   Diagnosis Date Noted  . Dysphagia 05/27/2016  . Metastasis to brain ([place]) 03/25/2016  . Encounter for antineoplastic chemotherapy 03/18/2016  . Malignant neoplasm of ascending colon ([place]) 01/31/2016  . Benign prostatic hypertrophy 10/20/2015  . Benign prostatic hyperplasia 10/19/2015  . Shock ([place])   . Right inguinal hernia 05/04/2014  . Other long term (current) drug therapy 01/06/2014  . Age related osteoporosis 12/22/2013  . 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 Paroxysmal digital cyanosis 12/22/2013  . Rheumatoid arthritis ([place]) 12/22/2013  . Colon cancer ([place]) 01/24/2013  . Cancer of middle lobe of lung ([place]) 01/24/2013  . Rheumatoid arthritis(714.0) 01/24/2013  . BPH (benign prostatic hyperplasia) 01/24/2013  . HTN (hypertension), benign 01/24/2013  . Anemia 08/26/2011    Past Surgical History:  Procedure Laterality Date  . APPENDECTOMY    . COLECTOMY  2004   right-hemi  . CYSTOSCOPY N/A 10/19/2015   Procedure: CYSTOSCOPY;  Surgeon: Carolan Clines, MD;  Location: [facility];  Service: Urology;  Laterality: N/A;  . GASTRECTOMY  1974   bleeding ulcers  . INGUINAL HERNIA REPAIR Right 06/03/2014   Procedure: HERNIA REPAIR INGUINAL ADULT;  Surgeon: Kaylyn Lim, MD;  Location: [place];  Service: General;  Laterality: Right;  . [place] SURGERY  2003  . lung lobectomy  2007   right  . tendons ruptured  2010   right-hand  . TRANSURETHRAL RESECTION OF PROSTATE N/A 10/19/2015   Procedure: TRANSURETHRAL RESECTION OF THE PROSTATE (TURP);  Surgeon: Carolan Clines, MD;  Location: [facility];  Service: Urology;  Laterality: N/A;  . VIDEO BRONCHOSCOPY WITH ENDOBRONCHIAL NAVIGATION N/A 03/04/2016   Procedure: VIDEO BRONCHOSCOPY WITH BRUSHINGS AND BIOPSY;  Surgeon: Grace Isaac, MD;  Location: [facility] [facility];  Service: Thoracic;  Laterality: N/A;    Prior to Admission medications   Medication Sig Start Date End Date Taking? Authorizing Provider    acetaminophen (TYLENOL) 500 MG tablet Take 1,500 mg by mouth every 6 (six) hours as needed (For pain.). Reported on 03/06/2016    Historical Provider, MD  amLODipine (NORVASC) 2.5 MG tablet Take 2.5 mg by mouth daily with lunch.     Historical Provider, MD  Calcium Carbonate-Vitamin D (CALCIUM + D PO) Take 1 tablet by mouth daily.     Historical Provider, MD  leflunomide (ARAVA) 10 MG tablet Take 10 mg by mouth daily.     Historical Provider, MD  LORazepam (ATIVAN) 0.5 MG tablet Take one tab po 30 minutes prior to MRI scans or radiation treatment 03/15/16   Hayden Pedro, PA-C  omeprazole (PRILOSEC) 20 MG capsule Take 20 mg by mouth daily.     Historical Provider, MD  oxyCODONE (OXY IR/ROXICODONE) 5 MG immediate release tablet Take 1 tablet (5 mg total) by mouth every 6 (six) hours as needed for severe pain. 05/27/16   Curt Bears, MD  predniSONE (DELTASONE) 5 MG tablet Take 5 mg by mouth daily with breakfast.    Historical Provider, MD  prochlorperazine (COMPAZINE) 10 MG tablet Take 1 tablet (10 mg total) by mouth every 6 (six) hours as needed for nausea or vomiting. 03/18/16   Curt Bears, MD  sucralfate (CARAFATE) 1 g tablet Take 1 tablet (1 g total) by mouth 4 (four) times daily -  with meals and at bedtime. 05/27/16   Curt Bears, MD  vitamin C (ASCORBIC ACID) 500 MG tablet Take 500 mg by mouth daily.    Historical Provider, MD  vitamin E 400 UNIT capsule Take 400 Units by mouth daily.    Historical Provider, MD    Allergies Aspirin; Diphenhydramine hcl; and Remicade [infliximab]  Family History  Problem Relation Age of Onset  . Cancer Sister   . Cancer Brother   . Colon cancer Neg Hx   . Pancreatic cancer Neg Hx   . Stomach cancer Neg Hx     Social History Social History  Substance Use Topics  . Smoking status: Former Smoker    Quit date: 05/30/2006  . Smokeless tobacco: Never Used  . Alcohol use 11.4 oz/week    12 Cans of beer, 7 Shots of liquor per week      Comment: drinks beer 5 days a week    Review of Systems Constitutional: No fever/chills Eyes: No visual changes. ENT: No sore throat. Cardiovascular: Denies chest pain. Respiratory: Denies shortness of breath. Gastrointestinal: No abdominal pain.  No nausea, no vomiting.  No diarrhea.  No constipation. Genitourinary: Negative for dysuria. Musculoskeletal: Negative for back pain. Skin: Negative for rash. Neurological: Negative for headaches, focal weakness or numbness.  10-point ROS otherwise negative.  ____________________________________________   PHYSICAL EXAM:  VITAL SIGNS: ED Triage Vitals  Enc Vitals Group     BP 06/06/16 1232 (!) 94/57     Pulse Rate 06/06/16 1232 (!) 53     Resp 06/06/16 1232 20     Temp  06/06/16 1232 98 F (36.7 C)     Temp Source 06/06/16 1232 Oral     SpO2 06/06/16 1232 96 %     Weight 06/06/16 1232 160 lb (72.6 kg)     Height 06/06/16 1232 '5\' 9"'$  (1.753 m)     Head Circumference --      Peak Flow --      Pain Score 06/06/16 1234 7     Pain Loc --      Pain Edu? --      Excl. in [place]? --     Constitutional: Alert and oriented. Well appearing and in no acute distress. Eyes: Conjunctivae are normal. PERRL. EOMI. Head: Atraumatic. Nose: No congestion/rhinnorhea. Mouth/Throat: Mucous membranes are moist.   Neck: No stridor.   Cardiovascular: Normal rate, regular rhythm. Grossly normal heart sounds.   Respiratory: Normal respiratory effort.  No retractions. Lungs CTAB. Gastrointestinal: Soft and nontender. No distention. No abdominal bruits. No CVA tenderness. Musculoskeletal: Mild tenderness to palpation of the left lateral lumbar region. No midline tenderness nor step-off. Patient is able to stand on his own, unassisted. No tenderness to the bilateral hips nor is there any pelvic instability. No deformity noted nor bruising.  Neurologic:  Normal speech and language. No gross focal neurologic deficits are appreciated. No gait instability. Skin:   Skin is warm, dry and intact. No rash noted. Psychiatric: Mood and affect are normal. Speech and behavior are normal.  ____________________________________________   LABS (all labs ordered are listed, but only abnormal results are displayed)  Labs Reviewed - No data to display ____________________________________________  EKG   ____________________________________________  RADIOLOGY DG Hip Unilat W or Wo Pelvis 2-3 Views Left (Accession [id number]) (Order [id number])  Imaging  Date: 06/06/2016 Department: [facility] EMERGENCY DEPARTMENT Released By/Authorizing: Orbie Pyo, MD (auto-released)  PACS Images   Show images for DG Hip Unilat W or Wo Pelvis 2-3 Views Left  Study Result   CLINICAL DATA:  Fall.  Left hip pain  EXAM: DG HIP (WITH OR WITHOUT PELVIS) 2-3V LEFT  COMPARISON:  None.  FINDINGS: There is no evidence of hip fracture or dislocation. There is no evidence of arthropathy or other focal bone abnormality.  IMPRESSION: Negative.   Electronically Signed   By: Franchot Gallo M.D.   On: 06/06/2016 14:20      ____________________________________________   PROCEDURES  Procedure(s) performed:   Procedures  Critical Care performed:   ____________________________________________   INITIAL IMPRESSION / ASSESSMENT AND PLAN / ED COURSE  Pertinent labs & imaging results that were available during my care of the patient were reviewed by me and considered in my medical decision making (see chart for details).  ----------------------------------------- 2:40 PM on 06/06/2016 -----------------------------------------  Patient without any acute findings on his imaging studies. I updated the patient as well as his wife to these findings. This is the primary's concern was a fracture of the left hip which does not appear to be present. Also, the patient appears to have PACs on the monitor. He intermittently has a heart  rate between 104 and 120. He says that he has had this irregular heartbeat intermittently over the past year. He says it is chronic for him and he does not feel any palpitations or lightheadedness. He is not requesting any further workup for this issue at this time.  Clinical Course     ____________________________________________   FINAL CLINICAL IMPRESSION(S) / ED DIAGNOSES  Fall. Left hip pain.    NEW MEDICATIONS STARTED  DURING THIS VISIT:  New Prescriptions   No medications on file     Note:  This document was prepared using Dragon voice recognition software and may include unintentional dictation errors.    Orbie Pyo, MD 06/06/16 [phone]

## 2016-06-06 NOTE — ED Triage Notes (Signed)
Pt comes into the ED via POV c/o fall earlier today.  Denies any chest pain, shortness of breath, dizziness.  Patient states "my feet just went out from under me".  Patient landed on the left hip.  History of ruptured disc where he recently was given an injection.  Patient is currently undergoing chemo for lung cancer.  Patient in NAD at this time with even and unlabored respirations.  Denies LOC after fall.

## 2016-06-10 ENCOUNTER — Telehealth: Payer: Self-pay | Admitting: *Deleted

## 2016-06-10 ENCOUNTER — Telehealth: Payer: Self-pay | Admitting: Internal Medicine

## 2016-06-10 ENCOUNTER — Ambulatory Visit: Payer: Medicare Other

## 2016-06-10 ENCOUNTER — Other Ambulatory Visit: Payer: Medicare Other

## 2016-06-10 NOTE — Telephone Encounter (Signed)
09/25 Appointment canceled per patient request. Patient's wife called to notify center that the patient is hospitalized at [facility].

## 2016-06-10 NOTE — Telephone Encounter (Signed)
Oncology Nurse Navigator Documentation  Oncology Nurse Navigator Flowsheets 06/10/2016  Navigator Location -  Navigator Encounter Type Telephone/Ms. Gist called and left a vm message to call. I called but was unable to reach and left vm message to call.  She called again and I was able to speak with her.  She updated me that her husband was at [facility] due to 2 falls.  I listened as she explained.  I asked that if the doctor's had questions to call Dr. Julien Nordmann.  I updated Dr. Julien Nordmann  Telephone Outgoing Call  Treatment Phase Treatment  Barriers/Navigation Needs Coordination of Care;Education  Education Other  Interventions Coordination of Care;Education Method  Coordination of Care Other  Education Method Verbal  Acuity Level 2  Acuity Level 2 Educational needs;Other  Time Spent with Patient 45

## 2016-06-13 ENCOUNTER — Telehealth: Payer: Self-pay | Admitting: *Deleted

## 2016-06-13 NOTE — Telephone Encounter (Signed)
"  This is Rosaire Cueto calling about my husband who's been admitted at [facility] had MRI.  Do I need to get a disc/CD of the images to bring to Dr.Mohamed.  I also need to talk with Navigator  About long term options.  The cancer has spread to his back and brain.  He's had a fall and sustained injuries."   Advised to bring copy and Dr.Mohamed "would like to see the images not just the written report".  Call transferred ext [phone].

## 2016-06-13 NOTE — Telephone Encounter (Signed)
I followed up with Dr. Julien Nordmann regarding patient being seen.  He stated for him to come and be seen with him on 06/18/16 at 11:45.  Wife verbalized understanding of appt. Will wait on Rad Onc follow up after he sees Dr. Julien Nordmann

## 2016-06-13 NOTE — Telephone Encounter (Signed)
Oncology Nurse Navigator Documentation  Oncology Nurse Navigator Flowsheets 06/13/2016  Navigator Encounter Type Telephone/I called [facility] to get recent scans loaded into PACS.  I was unable to reach anyone. I called Ms. Debow and asked her to get a CD of her husbands recent scans just in case I can't reach anyone at [facility].  I also called [facility].  They offer transportation to [place] [facility] for treatment.  I updated Ms. Sundstrom on this.  I will get back with her on follow up appt after I hear from Dr. Julien Nordmann and Dr. Tammi Klippel.   Telephone Outgoing Call  Treatment Phase Treatment  Barriers/Navigation Needs Coordination of Care  Interventions Coordination of Care  Coordination of Care Appts;Other  Acuity Level 2  Acuity Level 2 Other  Time Spent with Patient 30

## 2016-06-14 ENCOUNTER — Telehealth: Payer: Self-pay | Admitting: *Deleted

## 2016-06-14 NOTE — Telephone Encounter (Signed)
Oncology Nurse Navigator Documentation  Oncology Nurse Navigator Flowsheets 06/14/2016  Navigator Encounter Type Telephone/Ms. Rossini called today.  She updated me that she has spoken to the SNF about patient's transportation on 06/18/16 to see Dr. Julien Nordmann.  She just wanted to call and clarify appt.  I did.  She was thankful for the help.   Telephone Incoming Call  Treatment Phase Treatment  Barriers/Navigation Needs Coordination of Care  Interventions Coordination of Care  Coordination of Care Appts  Acuity Level 1  Acuity Level 1 Minimal follow up required  Time Spent with Patient 15

## 2016-06-18 ENCOUNTER — Encounter: Payer: Self-pay | Admitting: Internal Medicine

## 2016-06-18 ENCOUNTER — Telehealth: Payer: Self-pay | Admitting: Internal Medicine

## 2016-06-18 ENCOUNTER — Ambulatory Visit (HOSPITAL_BASED_OUTPATIENT_CLINIC_OR_DEPARTMENT_OTHER): Payer: Medicare Other | Admitting: Internal Medicine

## 2016-06-18 VITALS — BP 128/94 | HR 108 | Temp 98.0°F | Resp 18 | Ht 69.0 in

## 2016-06-18 DIAGNOSIS — C7931 Secondary malignant neoplasm of brain: Secondary | ICD-10-CM | POA: Diagnosis not present

## 2016-06-18 DIAGNOSIS — M549 Dorsalgia, unspecified: Secondary | ICD-10-CM | POA: Diagnosis not present

## 2016-06-18 DIAGNOSIS — C182 Malignant neoplasm of ascending colon: Secondary | ICD-10-CM

## 2016-06-18 DIAGNOSIS — C7951 Secondary malignant neoplasm of bone: Secondary | ICD-10-CM | POA: Diagnosis not present

## 2016-06-18 DIAGNOSIS — Z85038 Personal history of other malignant neoplasm of large intestine: Secondary | ICD-10-CM | POA: Diagnosis not present

## 2016-06-18 DIAGNOSIS — C342 Malignant neoplasm of middle lobe, bronchus or lung: Secondary | ICD-10-CM

## 2016-06-18 DIAGNOSIS — C3492 Malignant neoplasm of unspecified part of left bronchus or lung: Secondary | ICD-10-CM

## 2016-06-18 DIAGNOSIS — Z5111 Encounter for antineoplastic chemotherapy: Secondary | ICD-10-CM

## 2016-06-18 NOTE — Progress Notes (Signed)
[place] Telephone:([phone]) [phone]   [phone]  OFFICE PROGRESS NOTE  [name], MD [place] [address]  DIAGNOSIS:  1) Stage IV (T3, T3, M1b) non-small cell lung cancer, squamous cell carcinoma (PDL1 expression 40%), presented with large left lower lobe lung mass in addition to pulmonary nodule in the right upper lobe as well as multiple solid and cavitary nodules bilaterally in addition to mediastinal, right supraclavicular and right axillary lymphadenopathy as well as bone and brain metastasis, diagnosed in June 2017. PDL 1 expression is a still pending.  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 (Pathologic T3, N0, M0). 1.5 cm primary. No vascular or lymphatic invasion.  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 4.2 cm cavitary right middle lobe lesion with associated right hilar adenopathy which was PET avid.   PRIOR THERAPY: 1) status post right hemicolectomy 06/09/2002. (Pathologic T3, N0, M0). 1.5 cm primary. No vascular or lymphatic invasion followed by  adjuvant chemotherapy with 6 cycles of 5-fluorouracil plus leucovorin. 2) status post right middle lobe resection by Dr. Arlyce Dice on 08/20/2007 following neoadjuvant chemoradiation. He received weekly carboplatin and Taxol during radiation. Radiation given between August 4 and 06/08/2007. 36 gray to the primary tumor. Boost to 45 gray. 2 additional soft tissue deposits in the right middle lobe satellite lesions versus second primary also treated with a final total tumor dose of 65 gray.  CURRENT THERAPY: Systemic chemotherapy with carboplatin for AUC of 5 on day 1 and Abraxane 100 MG/M2 on days 1 and 8 every 3 weeks. First dose 03/25/2016.  Status post 4 cycles. Treatment is currently on hold.  INTERVAL HISTORY: [name] [name] 70 y.o. male returns to the clinic today for followup visit accompanied by his wife. The patient is currently on systemic chemotherapy with carboplatin and Abraxane status post 3 cycles and day 1 of cycle #4. He was tolerating his treatment well but he had 2 falls at home and he was admitted to [facility] with dehydration. MRI of the spines during his admission showed multiple metastatic bone lesions. He was also found to have fracture of the fibula. He is currently at a rehabilitation center close to home. He is expected to have physical therapy for the next 4 weeks. He is feeling much better today with less pain on the back. His back pain is controlled with oxycodone and Tylenol. He denied having any fever or chills. He has no nausea, vomiting, diarrhea or constipation. He has no chest pain but continues to have shortness breath with exertion with no cough or hemoptysis. He is here today for evaluation and discussion of his treatment options.  MEDICAL HISTORY: Past Medical History:  Diagnosis Date  . Adrenal insufficiency ([place])   . Bone cancer ([place])    L5 bone metastasis  . BPH (benign prostatic hyperplasia) 01/24/2013  . Brain cancer ([place])    4 mm solitary brain metastasis  . Cancer ([place])    lung/colon ca  . Cancer of middle lobe of lung ([place]) 01/24/2013   4.5 cm RML w associated R hilar adenopathy S/P chemo/RT then Surgery 8/08  . Chronic back pain   . Collagen vascular disease ([facility])    RA  . Colon cancer ([place]) 01/24/2013   T3N0M0  right colon  R hemicolectomy   . Dysphagia 05/27/2016  . Encounter for antineoplastic chemotherapy 03/18/2016  . GERD (gastroesophageal reflux disease)   . Headache   . HOH (hard of hearing)   . Lung mass   . PONV (postoperative nausea and vomiting)    adrenal insufficiency; needs steriod prior to procedures  . Raynauds phenomenon   . Rheumatoid  arthritis(714.0) 01/24/2013   Since age 14    ALLERGIES:  is allergic to aspirin; diphenhydramine hcl; and remicade [infliximab].  MEDICATIONS:  Current Outpatient Prescriptions  Medication Sig Dispense Refill  . acetaminophen (TYLENOL) 500 MG tablet Take 1,500 mg by mouth every 6 (six) hours as needed (For pain.). Reported on 03/06/2016    . amLODipine (NORVASC) 2.5 MG tablet Take 2.5 mg by mouth daily with lunch.     . Calcium Carbonate-Vitamin D (CALCIUM + D PO) Take 1 tablet by mouth daily.     Marland Kitchen leflunomide (ARAVA) 10 MG tablet Take 10 mg by mouth daily.     Marland Kitchen LORazepam (ATIVAN) 0.5 MG tablet Take one tab po 30 minutes prior to MRI scans or radiation treatment 30 tablet 0  . omeprazole (PRILOSEC) 20 MG capsule Take 20 mg by mouth daily.     Marland Kitchen oxyCODONE (OXY IR/ROXICODONE) 5 MG immediate release tablet Take 1 tablet (5 mg total) by mouth every 6 (six) hours as needed for severe pain. 30 tablet 0  . predniSONE (DELTASONE) 5 MG tablet Take 5 mg by mouth daily with breakfast.    . prochlorperazine (COMPAZINE) 10 MG tablet Take 1 tablet (10 mg total) by mouth every 6 (six) hours as needed for nausea or vomiting. 30 tablet 0  . sucralfate (CARAFATE) 1 g tablet Take 1 tablet (1 g total) by mouth 4 (four) times daily -  with meals and at bedtime. 120 tablet 2  . vitamin C (ASCORBIC ACID) 500 MG tablet Take 500 mg by mouth daily.    . vitamin E 400 UNIT capsule Take 400 Units by mouth daily.     No current facility-administered medications for this visit.     SURGICAL HISTORY:  Past Surgical History:  Procedure Laterality Date  . APPENDECTOMY    . COLECTOMY  2004   right-hemi  . CYSTOSCOPY N/A 10/19/2015   Procedure: CYSTOSCOPY;  Surgeon: Carolan Clines, MD;  Location: [facility];  Service: Urology;  Laterality: N/A;  . GASTRECTOMY  1974   bleeding ulcers  . INGUINAL HERNIA REPAIR Right 06/03/2014   Procedure: HERNIA REPAIR INGUINAL ADULT;  Surgeon: Kaylyn Lim, MD;  Location: [facility];  Service: General;  Laterality: Right;  . [place] SURGERY  2003  . lung lobectomy  2007   right  . tendons ruptured  2010   right-hand  . TRANSURETHRAL RESECTION OF PROSTATE N/A 10/19/2015   Procedure: TRANSURETHRAL RESECTION OF THE PROSTATE (TURP);  Surgeon: Carolan Clines, MD;  Location: [facility];  Service: Urology;  Laterality: N/A;  . VIDEO BRONCHOSCOPY WITH ENDOBRONCHIAL NAVIGATION N/A 03/04/2016   Procedure: VIDEO BRONCHOSCOPY WITH BRUSHINGS AND BIOPSY;  Surgeon: Grace Isaac, MD;  Location: [facility];  Service: Thoracic;  Laterality: N/A;    REVIEW OF SYSTEMS:  Constitutional: positive for fatigue Eyes: negative Ears, nose, mouth, throat, and face: negative Respiratory: positive for dyspnea on exertion Cardiovascular: negative Gastrointestinal: negative Genitourinary:negative Integument/breast: negative Hematologic/lymphatic: negative Musculoskeletal:positive for back pain Neurological: negative Behavioral/Psych: negative Endocrine: negative Allergic/Immunologic: negative   PHYSICAL EXAMINATION: General appearance: alert, cooperative and no distress  Head: Normocephalic, without obvious abnormality, atraumatic Neck: no adenopathy, no JVD, supple, symmetrical, trachea midline and thyroid not enlarged, symmetric, no tenderness/mass/nodules Lymph nodes: Cervical, supraclavicular, and axillary nodes normal. Resp: clear to auscultation bilaterally Back: symmetric, no curvature. ROM normal. No CVA tenderness. Cardio: regular rate and rhythm, S1, S2 normal, no murmur, click, rub or gallop GI: soft, non-tender; bowel sounds normal; no masses,  no organomegaly Extremities: extremities normal, atraumatic, no cyanosis or edema Neurologic: Alert and oriented X 3, normal strength and tone. Normal symmetric reflexes. Normal coordination and gait  ECOG PERFORMANCE STATUS: 1 - Symptomatic but completely ambulatory  Blood pressure (!) 128/94, pulse (!) 108,  temperature 98 F (36.7 C), temperature source Oral, resp. rate 18, height 5\' 9"  (1.753 m), SpO2 98 %.  LABORATORY DATA: Lab Results  Component Value Date   WBC 4.3 06/03/2016   HGB 9.5 (L) 06/03/2016   HCT 27.7 (L) 06/03/2016   MCV 93.0 06/03/2016   PLT 182 06/03/2016      Chemistry      Component Value Date/Time   NA 133 (L) 06/03/2016 1410   K 3.8 06/03/2016 1410   CL 103 03/04/2016 1147   CL 98 01/15/2013 1454   CO2 24 06/03/2016 1410   BUN 7.8 06/03/2016 1410   CREATININE 0.8 06/03/2016 1410      Component Value Date/Time   CALCIUM 9.2 06/03/2016 1410   ALKPHOS 182 (H) 06/03/2016 1410   AST 16 06/03/2016 1410   ALT 19 06/03/2016 1410   BILITOT 0.55 06/03/2016 1410       RADIOGRAPHIC STUDIES: Ct Chest W Contrast  Result Date: 05/24/2016 CLINICAL DATA:  Metastatic lung cancer to bones and brain. Increase cough and shortness of breath with weight loss and generalized weakness. EXAM: CT CHEST, ABDOMEN, AND PELVIS WITH CONTRAST TECHNIQUE: Multidetector CT imaging of the chest, abdomen and pelvis was performed following the standard protocol during bolus administration of intravenous contrast. CONTRAST:  07/24/2016 ISOVUE-300 IOPAMIDOL (ISOVUE-300) INJECTION 61% COMPARISON:  PET-CT 03/01/2016. Chest abdomen and pelvis CT from 02/14/2016. FINDINGS: CT CHEST FINDINGS Cardiovascular: The heart size is normal. No pericardial effusion. Coronary artery calcification is noted. Atherosclerotic calcification is noted in the wall of the thoracic aorta. Mediastinum/Nodes: 10 mm short axis precarinal lymph node seen is hypermetabolic on most recent comparison study has decreased in the interval, now measuring 5 mm short axis. The paraesophageal lymph node measured previously is not detectable on the current exam. No left hilar lymphadenopathy. Postsurgical/treatment changes noted in the right hilum. The esophagus has normal imaging features. The small hypermetabolic right axillary lymph nodes seen  on the previous PET-CT is not evident on today's exam. No evidence for axillary lymphadenopathy on either side by CT today. 7 mm right supraclavicular hypermetabolic lymph nodes seen on the PET-CT has decreased in the interval, measuring only 3 mm in short axis today. Lungs/Pleura: 2.3 x 1.2 cm lesion in the right apex has decreased substantially in the interval with slightly different orientation, measuring 1.6 x 0.3 cm. Changes of prior surgery and radiation noted in the parahilar right lung. 9 mm right lung nodule seen on image 98 series 8 is stable since the PET-CT. A large left infrahilar lesion has decreased markedly in the interval, measuring 3.0 x 2.5 see BM today compared to approximately 5.0 by I 4.5 cm when I remeasure it on the prior PET-CT. 8 mm cavitary nodule peripheral left lower lobe has decreased, measuring 4 mm today on image 123 series 8. 9 mm cavitary  nodule lateral right lung base (image 140 series 8) was 10 mm on the PET-CT. Musculoskeletal: Bone windows reveal no worrisome lytic or sclerotic osseous lesions. CT ABDOMEN PELVIS FINDINGS Hepatobiliary: No focal abnormality within the liver parenchyma. There is no evidence for gallstones, gallbladder wall thickening, or pericholecystic fluid. No intrahepatic or extrahepatic biliary dilation. Pancreas: No focal mass lesion. No dilatation of the main duct. No intraparenchymal cyst. No peripancreatic edema. Spleen: No splenomegaly. No focal mass lesion. Adrenals/Urinary Tract: Bilateral tiny adrenal nodules are stable and showed no hypermetabolism on the PET-CT. These are likely benign adrenal adenomas. No enhancing abnormality is seen in either kidney. No evidence for hydroureter. The urinary bladder appears normal for the degree of distention. Stomach/Bowel: Surgical clips noted in the region of the esophagogastric junction. Stomach otherwise unremarkable. Duodenum is normally positioned as is the ligament of Treitz. No small bowel wall  thickening. No small bowel dilatation. Patient is status post right hemicolectomy. No gross colonic mass. No colonic wall thickening. No substantial diverticular change. 2.2 cm fatty lesion in the descending colon, present previously, in compatible with colonic lipoma. Vascular/Lymphatic: There is abdominal aortic atherosclerosis without aneurysm. There is no gastrohepatic or hepatoduodenal ligament lymphadenopathy. No intraperitoneal or retroperitoneal lymphadenopathy. No pelvic sidewall lymphadenopathy. Reproductive: Central defect in the prostate gland suggest previous TURP. Other: No intraperitoneal free fluid. Musculoskeletal: Bone windows reveal no worrisome lytic or sclerotic osseous lesions. IMPRESSION: 1. Interval response to therapy in the chest. Right supraclavicular, axillary, and mediastinal lymphadenopathy has decreased in the interval and the right axillary lymph node is no longer detectable. 2. Right upper lobe pulmonary nodule and left infrahilar necrotic lesion also show marked interval decrease in size since previous PET-CT. 3. Bibasilar tiny cavitary nodules in the lungs are smaller in the interval. 4. No new or progressive findings in the chest, abdomen, or pelvis. 5. Stable appearance of bilateral adrenal nodules, most suggestive of benign adenomas. 6. Stable appearance left colonic lipoma. Electronically Signed   By: Misty Stanley M.D.   On: 05/24/2016 15:33   Ct Abdomen Pelvis W Contrast  Result Date: 05/24/2016 CLINICAL DATA:  Metastatic lung cancer to bones and brain. Increase cough and shortness of breath with weight loss and generalized weakness. EXAM: CT CHEST, ABDOMEN, AND PELVIS WITH CONTRAST TECHNIQUE: Multidetector CT imaging of the chest, abdomen and pelvis was performed following the standard protocol during bolus administration of intravenous contrast. CONTRAST:  169m ISOVUE-300 IOPAMIDOL (ISOVUE-300) INJECTION 61% COMPARISON:  PET-CT 03/01/2016. Chest abdomen and pelvis CT  from 02/14/2016. FINDINGS: CT CHEST FINDINGS Cardiovascular: The heart size is normal. No pericardial effusion. Coronary artery calcification is noted. Atherosclerotic calcification is noted in the wall of the thoracic aorta. Mediastinum/Nodes: 10 mm short axis precarinal lymph node seen is hypermetabolic on most recent comparison study has decreased in the interval, now measuring 5 mm short axis. The paraesophageal lymph node measured previously is not detectable on the current exam. No left hilar lymphadenopathy. Postsurgical/treatment changes noted in the right hilum. The esophagus has normal imaging features. The small hypermetabolic right axillary lymph nodes seen on the previous PET-CT is not evident on today's exam. No evidence for axillary lymphadenopathy on either side by CT today. 7 mm right supraclavicular hypermetabolic lymph nodes seen on the PET-CT has decreased in the interval, measuring only 3 mm in short axis today. Lungs/Pleura: 2.3 x 1.2 cm lesion in the right apex has decreased substantially in the interval with slightly different orientation, measuring 1.6 x 0.3 cm. Changes of prior surgery  and radiation noted in the parahilar right lung. 9 mm right lung nodule seen on image 98 series 8 is stable since the PET-CT. A large left infrahilar lesion has decreased markedly in the interval, measuring 3.0 x 2.5 see BM today compared to approximately 5.0 by I 4.5 cm when I remeasure it on the prior PET-CT. 8 mm cavitary nodule peripheral left lower lobe has decreased, measuring 4 mm today on image 123 series 8. 9 mm cavitary nodule lateral right lung base (image 140 series 8) was 10 mm on the PET-CT. Musculoskeletal: Bone windows reveal no worrisome lytic or sclerotic osseous lesions. CT ABDOMEN PELVIS FINDINGS Hepatobiliary: No focal abnormality within the liver parenchyma. There is no evidence for gallstones, gallbladder wall thickening, or pericholecystic fluid. No intrahepatic or extrahepatic biliary  dilation. Pancreas: No focal mass lesion. No dilatation of the main duct. No intraparenchymal cyst. No peripancreatic edema. Spleen: No splenomegaly. No focal mass lesion. Adrenals/Urinary Tract: Bilateral tiny adrenal nodules are stable and showed no hypermetabolism on the PET-CT. These are likely benign adrenal adenomas. No enhancing abnormality is seen in either kidney. No evidence for hydroureter. The urinary bladder appears normal for the degree of distention. Stomach/Bowel: Surgical clips noted in the region of the esophagogastric junction. Stomach otherwise unremarkable. Duodenum is normally positioned as is the ligament of Treitz. No small bowel wall thickening. No small bowel dilatation. Patient is status post right hemicolectomy. No gross colonic mass. No colonic wall thickening. No substantial diverticular change. 2.2 cm fatty lesion in the descending colon, present previously, in compatible with colonic lipoma. Vascular/Lymphatic: There is abdominal aortic atherosclerosis without aneurysm. There is no gastrohepatic or hepatoduodenal ligament lymphadenopathy. No intraperitoneal or retroperitoneal lymphadenopathy. No pelvic sidewall lymphadenopathy. Reproductive: Central defect in the prostate gland suggest previous TURP. Other: No intraperitoneal free fluid. Musculoskeletal: Bone windows reveal no worrisome lytic or sclerotic osseous lesions. IMPRESSION: 1. Interval response to therapy in the chest. Right supraclavicular, axillary, and mediastinal lymphadenopathy has decreased in the interval and the right axillary lymph node is no longer detectable. 2. Right upper lobe pulmonary nodule and left infrahilar necrotic lesion also show marked interval decrease in size since previous PET-CT. 3. Bibasilar tiny cavitary nodules in the lungs are smaller in the interval. 4. No new or progressive findings in the chest, abdomen, or pelvis. 5. Stable appearance of bilateral adrenal nodules, most suggestive of benign  adenomas. 6. Stable appearance left colonic lipoma. Electronically Signed   By: Misty Stanley M.D.   On: 05/24/2016 15:33   Dg Inject Diag/thera/inc Needle/cath/plc Epi/lumb/sac W/img  Result Date: 06/04/2016 CLINICAL DATA:  Lumbosacral spondylosis without myelopathy. Lumbar radiculopathy. Low back pain and left lower extremity pain. The patient reports no lasting relief following last month's interlaminar epidural injection. An L5 nerve root block/transforaminal epidural earlier this year provided good relief and will be repeated today. EXAM: DG INJECT/THERA INC NEEDLE/CATH/PLC EPI/LUMB/SAC W/IMG FLUOROSCOPY TIME:  Radiation Exposure Index (as provided by the fluoroscopic device): 54.05 microGray*m^2 Fluoroscopy Time (in minutes and seconds):  24 seconds PROCEDURE: The procedure, risks, benefits, and alternatives were explained to the patient. Questions regarding the procedure were encouraged and answered. The patient understands and consents to the procedure. LEFT L5 NERVE ROOT BLOCK AND TRANSFORAMINAL EPIDURAL: A posterior oblique approach was taken to the intervertebral foramen on the left at L5-S1 using a curved 3.5 inch 22 gauge spinal needle. Injection of Isovue-M 200 outlined the left L5 nerve root and showed good epidural spread. No vascular opacification is seen. 120 mg  of Depo-Medrol mixed with 1.5 mL of 1% lidocaine were instilled. The procedure was well-tolerated, and the patient was discharged thirty minutes following the injection in good condition. COMPLICATIONS: None IMPRESSION: Technically successful injection consisting of a left L5 nerve root block and transforaminal epidural. Electronically Signed   By: Logan Bores M.D.   On: 06/04/2016 13:22   Dg Hip Unilat W Or Wo Pelvis 2-3 Views Left  Result Date: 06/06/2016 CLINICAL DATA:  Fall.  Left hip pain EXAM: DG HIP (WITH OR WITHOUT PELVIS) 2-3V LEFT COMPARISON:  None. FINDINGS: There is no evidence of hip fracture or dislocation. There is  no evidence of arthropathy or other focal bone abnormality. IMPRESSION: Negative. Electronically Signed   By: Franchot Gallo M.D.   On: 06/06/2016 14:20   ASSESSMENT AND PLAN: This is a very pleasant 70 years old white male with  1) history of stage II colon adenocarcinoma diagnosed in 2003   2) history stage III non-small cell lung cancer diagnosed in 2008 status post dissection as well as adjuvant therapy. He has been observation since that time with no significant evidence for disease recurrence.  3) recurrent and metastatic ( stage IV) non-small cell lung cancer, squamous cell carcinoma, PDL1 expression 40%, diagnosed in June 2017 when the patient presented for routine annual exam and his blood work showed elevation of CEA up to 11.3. Last year it was 4.1.  I ordered repeat CT scan of the chest, abdomen and pelvis which was performed a few days ago and unfortunately it showed multiple new pulmonary nodules and masses concerning for either metastatic disease to the lung or multiple new synchronous primaries. There was also what appears to be an erosion through the inferior wall of the proximal left mainstem bronchus with a small amount of gas in the middle mediastinum and the subcarinal region. There was also evidence of subcarinal and lower right paratracheal lymphadenopathy. The scan also showed fat containing lesion in the distal descending colon questionable for colonic lipoma or lipomatous polyp. This was followed by a PET scan which showed hypermetabolic activity in the left lower lobe lung mass in addition to the right upper lobe pulmonary nodule as well as multiple solid and cavitary bilateral pulmonary nodules and suspicious L5 bone metastasis. 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 The patient underwent bronchoscopy under the care of Dr. Servando Snare and the final pathology is consistent with squamous cell carcinoma. He completed stereotactic radiotherapy to the  solitary brain lesion under the care of Dr. Tammi Klippel.  His currently on systemic chemotherapy with carboplatin and Abraxane status post 3 cycles and tolerated the last cycle of his treatment fairly well except for fatigue. The recent CT scan of the chest, abdomen and pelvis showed improvement of his disease. He'll receive day 1 of cycle #4 but with the recent fall and fracture of the fibula, I recommended for the patient to hold his treatment for the next 4 weeks until improvement of his condition.  I will see him back for follow-up visit in 4 weeks for evaluation before resuming his systemic therapy. For the back pain, he will continue on Percocet and Tylenol for now with good control of his back pain. I would consider referring him to Dr. Tammi Klippel for reevaluation and palliative radiotherapy to the painful lesion if it is getting worse. The patient and his wife agreed to the current plan. He was advised to call immediately if he has any concerning symptoms in the interval.  The patient voices understanding of current disease status and treatment options and is in agreement with the current care plan.  All questions were answered. The patient knows to call the clinic with any problems, questions or concerns. We can certainly see the patient much sooner if necessary.  Disclaimer: This note was dictated with voice recognition software. Similar sounding words can inadvertently be transcribed and may not be corrected upon review.

## 2016-06-18 NOTE — Telephone Encounter (Signed)
Per 10/3 los cxd all lab/chemo appointments until next f/u. Spoke with wife re next appointment for lab/fu 11/7.

## 2016-06-20 ENCOUNTER — Inpatient Hospital Stay
Admission: RE | Admit: 2016-06-20 | Discharge: 2016-06-20 | Disposition: A | Discharge status: Home / Self Care | Payer: Self-pay | Source: Ambulatory Visit | Attending: Radiation Oncology | Admitting: Radiation Oncology

## 2016-06-20 ENCOUNTER — Other Ambulatory Visit: Payer: Self-pay | Admitting: Radiation Oncology

## 2016-06-20 DIAGNOSIS — C801 Malignant (primary) neoplasm, unspecified: Secondary | ICD-10-CM

## 2016-06-20 DIAGNOSIS — C7949 Secondary malignant neoplasm of other parts of nervous system: Principal | ICD-10-CM

## 2016-06-20 DIAGNOSIS — C7931 Secondary malignant neoplasm of brain: Secondary | ICD-10-CM

## 2016-06-21 ENCOUNTER — Inpatient Hospital Stay
Admission: RE | Admit: 2016-06-21 | Discharge: 2016-06-21 | Disposition: A | Discharge status: Home / Self Care | Payer: Self-pay | Source: Ambulatory Visit | Attending: Radiation Oncology | Admitting: Radiation Oncology

## 2016-06-21 ENCOUNTER — Other Ambulatory Visit: Payer: Self-pay | Admitting: Radiation Oncology

## 2016-06-21 DIAGNOSIS — C801 Malignant (primary) neoplasm, unspecified: Secondary | ICD-10-CM

## 2016-06-24 ENCOUNTER — Ambulatory Visit: Payer: Medicare Other

## 2016-06-24 ENCOUNTER — Other Ambulatory Visit: Payer: Medicare Other

## 2016-06-24 ENCOUNTER — Ambulatory Visit: Payer: Medicare Other | Admitting: Internal Medicine

## 2016-06-27 ENCOUNTER — Other Ambulatory Visit: Payer: Medicare Other

## 2016-07-01 ENCOUNTER — Ambulatory Visit: Payer: Medicare Other

## 2016-07-01 ENCOUNTER — Other Ambulatory Visit: Payer: Medicare Other

## 2016-07-01 ENCOUNTER — Ambulatory Visit
Admission: RE | Admit: 2016-07-01 | Discharge: 2016-07-01 | Disposition: A | Discharge status: Home / Self Care | Payer: Medicare Other | Source: Ambulatory Visit | Attending: Radiation Oncology | Admitting: Radiation Oncology

## 2016-07-05 NOTE — Progress Notes (Signed)
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 (Pathologic T3, N0, M0). 1.5 cm primary. No vascular or lymphatic invasion followed by  adjuvant chemotherapy with 6 cycles of 5-fluorouracil plus leucovorin. 2) status post right middle lobe resection by Dr. Arlyce Dice on 08/20/2007 following neoadjuvant chemoradiation. He received weekly carboplatin and Taxol during radiation. Radiation given between August 4 and 06/08/2007. 36 gray to the primary tumor. Boost to 45 gray. 2 additional soft tissue deposits in the right middle lobe satellite lesions versus second primary also treated with a final total tumor dose of 65 gray.  CURRENT THERAPY: Systemic chemotherapy with carboplatin for AUC of 5 on day 1 and Abraxane 100 MG/M2 on days 1 and 8 every 3 weeks. First dose 03/25/2016. Status post 4 cycles. Treatment is currently on hold. Follow up with Mohamed on 07/23/2016  Pain on a scale of 0-10 is: Reports constant left hip pain which resolves after he takes oxycodone two tablets every 4-6 hours   If Spine Met(s), symptoms, if any, include:  Bowel/Bladder retention or incontinence (please describe): No  Numbness or weakness in extremities (please describe): No  Current Decadron regimen, if applicable: No  Ambulatory status? Walker? Wheelchair?: None weight bearing. Wheelchair.  SAFETY ISSUES:  Prior radiation? yes  Pacemaker/ICD? no  Possible current pregnancy? no  Is the patient on methotrexate? no  Current Complaints / other details:  70 year old male. Patient fell for the first time on September 20 then, again on September 22. Patient was taken via ambulance to [facility] ED on Sept 23. Patient remained hospitalized  from Sept 23-28. Ultimately, it was determined he broke his left ankle and separated some tendons thus, surgery was performed to place a plate with screws. Work up images revealed extensive spine disease. Patient was discharged from [place] to [place]. He participates in PT and OT daily. Plans to return home later this week.  20 lb weight loss noted since last chemotherapy. Patient reports a poor appetite. Denies nausea, vomiting or diarrhea.

## 2016-07-08 ENCOUNTER — Encounter: Payer: Self-pay | Admitting: Radiation Oncology

## 2016-07-08 ENCOUNTER — Ambulatory Visit
Admission: RE | Admit: 2016-07-08 | Discharge: 2016-07-08 | Disposition: A | Discharge status: Home / Self Care | Payer: Medicare (Managed Care) | Source: Ambulatory Visit | Attending: Radiation Oncology | Admitting: Radiation Oncology

## 2016-07-08 ENCOUNTER — Other Ambulatory Visit: Payer: Self-pay | Admitting: Radiation Oncology

## 2016-07-08 VITALS — BP 115/79 | HR 86 | Resp 16 | Wt 142.6 lb

## 2016-07-08 DIAGNOSIS — C7951 Secondary malignant neoplasm of bone: Secondary | ICD-10-CM

## 2016-07-08 DIAGNOSIS — C342 Malignant neoplasm of middle lobe, bronchus or lung: Secondary | ICD-10-CM

## 2016-07-08 DIAGNOSIS — M25552 Pain in left hip: Secondary | ICD-10-CM | POA: Insufficient documentation

## 2016-07-08 DIAGNOSIS — C7931 Secondary malignant neoplasm of brain: Secondary | ICD-10-CM

## 2016-07-08 DIAGNOSIS — C182 Malignant neoplasm of ascending colon: Secondary | ICD-10-CM | POA: Diagnosis present

## 2016-07-08 DIAGNOSIS — C3492 Malignant neoplasm of unspecified part of left bronchus or lung: Secondary | ICD-10-CM | POA: Diagnosis present

## 2016-07-08 MED ORDER — MORPHINE SULFATE ER 30 MG PO TBCR: 30.0000 mg | EXTENDED_RELEASE_TABLET | Freq: Two times a day (BID) | ORAL | 0 refills | Status: DC

## 2016-07-08 NOTE — Progress Notes (Signed)
See progress note under physician encounter. 

## 2016-07-08 NOTE — Progress Notes (Signed)
Radiation Oncology         [phone] ________________________________  Name: [name] [name] MRN: [id number]  Date: 07/08/2016  DOB: 12-Sep-1946  Follow-Up Visit Note  CC: [name], MD  [name].,*  Diagnosis:   70 yo man with a solitary 5 mm left frontal brain metastasis.    ICD-9-CM ICD-10-CM   1. 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 Malignant neoplasm of ascending colon (HCC) 153.6 C18.2   3. Cancer of middle lobe of lung (HCC) 162.4 C34.2   4. Metastasis to brain (HCC) 198.3 C79.31     Interval Since Last Radiation: 3 months, 03/29/2016   Narrative:  The patient returns today for routine follow-up. Patient fell for the first time on 06/05/16, and then again on 06/07/16. Patient was taken via ambulance to [facility] ED on 06/08/16 where he remained hospitalized from 9/23 - 06/13/16. Ultimately, it was determined he broke his left ankle and separated some tendons, thus surgery was performed to place a plate with screws. Work up images revealed extensive spine disease. Patient was discharged from [facility] to [facility]. He participates in PT and OT daily, and plans to return home later this week. His chemotherapy treatment is currently on hold. The patient returns today to discuss latest imaging.               ALLERGIES:  is allergic to lorazepam; aspirin; diphenhydramine hcl; and remicade [infliximab].  Meds: Current Outpatient Prescriptions  Medication Sig Dispense Refill  . acetaminophen (TYLENOL) 500 MG tablet Take 1,500 mg by mouth every 6 (six) hours as needed (For pain.). Reported on 03/06/2016    . enoxaparin (LOVENOX) 40 MG/0.4ML injection Inject 0.4 mLs into the skin daily.    Marland Kitchen leflunomide (ARAVA) 10 MG tablet Take 10 mg by mouth daily.    Marland Kitchen loperamide (IMODIUM A-D) 2 MG tablet Take 2 mg by mouth 4 (four) times daily as needed for diarrhea or loose stools.    . magnesium oxide (MAG-OX) 400 MG tablet Take 400 mg by  mouth daily.    . Melatonin 3 MG TABS Take 3 mg by mouth at bedtime as needed.    Marland Kitchen omeprazole (PRILOSEC) 20 MG capsule Take 20 mg by mouth daily.     Marland Kitchen oxyCODONE (OXY IR/ROXICODONE) 5 MG immediate release tablet Take by mouth.    . potassium chloride (K-DUR,KLOR-CON) 10 MEQ tablet Take 10 mEq by mouth 2 (two) times daily.    . predniSONE (DELTASONE) 5 MG tablet TAKE 1 TABLET BY MOUTH DAILY AS DIRECTED    . prochlorperazine (COMPAZINE) 10 MG tablet Take by mouth.    . morphine (MS CONTIN) 30 MG 12 hr tablet Take 1 tablet (30 mg total) by mouth every 12 (twelve) hours. 60 tablet 0   No current facility-administered medications for this encounter.     Physical Findings: The patient is in no acute distress. Patient is alert and oriented. Patient reports a poor appetite. He denies nausea, vomiting, or diarrhea. Patient reports constant left hip pain which resolves after he takes oxycodone, two tablets every 4-6 hours.  weight is 142 lb 9.6 oz (64.7 kg). His blood pressure is 115/79 and his pulse is 86. His respiration is 16 and oxygen saturation is 100%. .  Patient has lost 20 pounds since last chemotherapy on 06/03/16.  Lab Findings: Lab Results  Component Value Date   WBC 4.3 06/03/2016   WBC 8.4  03/04/2016   HGB 9.5 (L) 06/03/2016   HCT 27.7 (L) 06/03/2016   PLT 182 06/03/2016    Lab Results  Component Value Date   NA 133 (L) 06/03/2016   K 3.8 06/03/2016   CHLORIDE 99 06/03/2016   CO2 24 06/03/2016   GLUCOSE 122 06/03/2016   GLUCOSE 100 (H) 01/15/2013   BUN 7.8 06/03/2016   CREATININE 0.8 06/03/2016   BILITOT 0.55 06/03/2016   ALKPHOS 182 (H) 06/03/2016   AST 16 06/03/2016   ALT 19 06/03/2016   PROT 7.0 06/03/2016   ALBUMIN 2.5 (L) 06/03/2016   CALCIUM 9.2 06/03/2016   ANIONGAP 11 06/03/2016   ANIONGAP 10 03/04/2016    Radiographic Findings: No results found.  Impression:  The patient has left hip pain without clear etiology.  His back pain is not bothersome  currently.  Radiotherapy could be used for pain control.  Plan: We will follow up as needed in the next 1-2 weeks. We will prescribe morphine bid to be taken in conjunction with the oxycodone if needed.  _____________________________________  Sheral Apley Tammi Klippel, M.D.  This document serves as a record of services personally performed by Tyler Pita, MD. It was created on his behalf by Maryla Morrow, a trained medical scribe. The creation of this record is based on the scribe's personal observations and the provider's statements to them. This document has been checked and approved by the attending provider.

## 2016-07-09 NOTE — Addendum Note (Signed)
Encounter addended by: Heywood Footman, RN on: 07/09/2016  2:51 PM<BR>    Actions taken: Charge Capture section accepted

## 2016-07-15 ENCOUNTER — Other Ambulatory Visit: Payer: Medicare Other

## 2016-07-15 ENCOUNTER — Ambulatory Visit: Payer: Medicare Other | Admitting: Internal Medicine

## 2016-07-15 ENCOUNTER — Ambulatory Visit: Payer: Medicare Other

## 2016-07-22 ENCOUNTER — Other Ambulatory Visit: Payer: Medicare Other

## 2016-07-22 ENCOUNTER — Ambulatory Visit: Payer: Medicare Other

## 2016-07-23 ENCOUNTER — Telehealth: Payer: Self-pay | Admitting: Internal Medicine

## 2016-07-23 ENCOUNTER — Other Ambulatory Visit (HOSPITAL_BASED_OUTPATIENT_CLINIC_OR_DEPARTMENT_OTHER): Payer: Medicare Other

## 2016-07-23 ENCOUNTER — Ambulatory Visit (HOSPITAL_BASED_OUTPATIENT_CLINIC_OR_DEPARTMENT_OTHER): Payer: Medicare Other | Admitting: Internal Medicine

## 2016-07-23 ENCOUNTER — Telehealth: Payer: Self-pay | Admitting: *Deleted

## 2016-07-23 ENCOUNTER — Encounter: Payer: Self-pay | Admitting: Internal Medicine

## 2016-07-23 ENCOUNTER — Encounter: Payer: Self-pay | Admitting: *Deleted

## 2016-07-23 VITALS — BP 121/80 | HR 96 | Temp 99.3°F | Resp 16 | Ht 69.0 in | Wt 139.4 lb

## 2016-07-23 DIAGNOSIS — C342 Malignant neoplasm of middle lobe, bronchus or lung: Secondary | ICD-10-CM | POA: Diagnosis present

## 2016-07-23 DIAGNOSIS — C3492 Malignant neoplasm of unspecified part of left bronchus or lung: Secondary | ICD-10-CM

## 2016-07-23 DIAGNOSIS — R5383 Other fatigue: Secondary | ICD-10-CM | POA: Diagnosis not present

## 2016-07-23 DIAGNOSIS — C7951 Secondary malignant neoplasm of bone: Secondary | ICD-10-CM | POA: Diagnosis not present

## 2016-07-23 DIAGNOSIS — Z5112 Encounter for antineoplastic immunotherapy: Secondary | ICD-10-CM | POA: Insufficient documentation

## 2016-07-23 DIAGNOSIS — C7931 Secondary malignant neoplasm of brain: Secondary | ICD-10-CM | POA: Diagnosis not present

## 2016-07-23 DIAGNOSIS — M549 Dorsalgia, unspecified: Secondary | ICD-10-CM | POA: Diagnosis not present

## 2016-07-23 DIAGNOSIS — R5382 Chronic fatigue, unspecified: Secondary | ICD-10-CM

## 2016-07-23 LAB — CBC WITH DIFFERENTIAL/PLATELET
BASO%: 0.2 % (ref 0.0–2.0)
Basophils Absolute: 0 10*3/uL (ref 0.0–0.1)
EOS ABS: 0 10*3/uL (ref 0.0–0.5)
EOS%: 0.5 % (ref 0.0–7.0)
HCT: 30.5 % — ABNORMAL LOW (ref 38.4–49.9)
HGB: 10.2 g/dL — ABNORMAL LOW (ref 13.0–17.1)
LYMPH%: 14.8 % (ref 14.0–49.0)
MCH: 31.9 pg (ref 27.2–33.4)
MCHC: 33.4 g/dL (ref 32.0–36.0)
MCV: 95.3 fL (ref 79.3–98.0)
MONO#: 0.6 10*3/uL (ref 0.1–0.9)
MONO%: 9.3 % (ref 0.0–14.0)
NEUT%: 75.2 % — ABNORMAL HIGH (ref 39.0–75.0)
NEUTROS ABS: 4.6 10*3/uL (ref 1.5–6.5)
Platelets: 190 10*3/uL (ref 140–400)
RBC: 3.2 10*6/uL — AB (ref 4.20–5.82)
RDW: 16.1 % — AB (ref 11.0–14.6)
WBC: 6.2 10*3/uL (ref 4.0–10.3)
lymph#: 0.9 10*3/uL (ref 0.9–3.3)

## 2016-07-23 LAB — COMPREHENSIVE METABOLIC PANEL
ALT: 12 U/L (ref 0–55)
AST: 12 U/L (ref 5–34)
Albumin: 2.6 g/dL — ABNORMAL LOW (ref 3.5–5.0)
Alkaline Phosphatase: 193 U/L — ABNORMAL HIGH (ref 40–150)
Anion Gap: 10 mEq/L (ref 3–11)
BILIRUBIN TOTAL: 0.59 mg/dL (ref 0.20–1.20)
BUN: 7.9 mg/dL (ref 7.0–26.0)
CO2: 24 meq/L (ref 22–29)
Calcium: 9.1 mg/dL (ref 8.4–10.4)
Chloride: 98 mEq/L (ref 98–109)
Creatinine: 0.7 mg/dL (ref 0.7–1.3)
GLUCOSE: 103 mg/dL (ref 70–140)
POTASSIUM: 4.1 meq/L (ref 3.5–5.1)
SODIUM: 133 meq/L — AB (ref 136–145)
TOTAL PROTEIN: 6.7 g/dL (ref 6.4–8.3)

## 2016-07-23 MED ORDER — OXYCODONE HCL 5 MG PO TABS: 5.0000 mg | ORAL_TABLET | Freq: Four times a day (QID) | ORAL | 0 refills | Status: DC | PRN

## 2016-07-23 NOTE — Telephone Encounter (Signed)
Per LOS I have scheduled appats and notified the scheuler

## 2016-07-23 NOTE — Progress Notes (Signed)
START ON PATHWAY REGIMEN - Non-Small Cell Lung  [id number]: Pembrolizumab 200 mg q21 Days Until Disease Progression, Unacceptable Toxicity, or up to 24 Months   A cycle is 21 days:     Pembrolizumab [facility])) 200 mg flat dose in 50 mL NS IV over 30 minutes every 21 days.  Inline filter required (low-protein binding) Dose Mod: None Additional Orders: Severe immune-mediated reactions can occur. See prescribing information for more details and required immediate management with steroids. Monitor thyroid, renal, liver function tests, glucose, and sodium at baseline and before each  dose of pembrolizumab. Ref: Keytruda(R) (pembrolizumab) prescribing information, 2016.  **Always confirm dose/schedule in your pharmacy ordering system**    Patient Characteristics: Stage IV Metastatic, Squamous, PS = 2, Second Line, No Prior PD-1/PD-L1  Inhibitor and Immunotherapy Candidate AJCC M Stage: 1 AJCC N Stage: 3 AJCC T Stage: 3 Current Disease Status: Distant Metastases AJCC Stage Grouping: IV Histology: Squamous Cell Line of therapy: Second Line PD-L1 Expression Status: PD-L1 Positive 1-49% (TPS) Performance Status: PS = 2 Would you be surprised if this patient died  in the next year? I would NOT be surprised if this patient died in the next year Immunotherapy Candidate Status: Candidate for Immunotherapy Prior Immunotherapy Status: No Prior PD-1/PD-L1 Inhibitor  Intent of Therapy: Non-Curative / Palliative Intent, Discussed with Patient

## 2016-07-23 NOTE — Progress Notes (Signed)
Oncology Nurse Navigator Documentation  Oncology Nurse Navigator Flowsheets 07/23/2016  Navigator Location [facility]  Navigator Encounter Type Clinic/MDC/I spoke with patient and his family today.  Gave and explained information on treatment regimen change.    Patient Visit Type MedOnc  Treatment Phase Other  Barriers/Navigation Needs Education  Education Other  Interventions Education  Education Method Verbal;Written  Acuity Level 2  Acuity Level 2 Educational needs  Time Spent with Patient 30

## 2016-07-23 NOTE — Progress Notes (Signed)
[name] [name]   [phone]) [phone]  OFFICE PROGRESS NOTE  [name] Kluver, MD [place] [address]  DIAGNOSIS:  1) Stage IV (T3, T3, M1b) non-small cell lung cancer, squamous cell carcinoma (PDL1 expression 40%), presented with large left lower lobe lung mass in addition to pulmonary nodule in the right upper lobe as well as multiple solid and cavitary nodules bilaterally in addition to mediastinal, right supraclavicular and right axillary lymphadenopathy as well as bone and brain metastasis, diagnosed in June 2017. PDL 1 expression is a still pending.  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 (Pathologic T3, N0, M0). 1.5 cm primary. No vascular or lymphatic invasion.  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 4.2 cm cavitary right middle lobe lesion with associated right hilar adenopathy which was PET avid.   PRIOR THERAPY: 1) status post right hemicolectomy 06/09/2002. (Pathologic T3, N0, M0). 1.5 cm primary. No vascular or lymphatic invasion followed by  adjuvant chemotherapy with 6 cycles of 5-fluorouracil plus leucovorin. 2) status post right middle lobe resection by Dr. Arlyce Dice on 08/20/2007 following neoadjuvant chemoradiation. He received weekly carboplatin and Taxol during radiation. Radiation given between August 4 and 06/08/2007. 36 gray to the primary tumor. Boost to 45 gray. 2 additional soft tissue deposits in the right middle lobe satellite lesions versus second primary also treated with a final total tumor dose of 65 gray. 3) Systemic chemotherapy with carboplatin for AUC of 5 on day 1 and Abraxane 100 MG/M2 on days 1 and 8 every 3 weeks. First dose 03/25/2016. Status post 4  cycles. Last dose was given 06/03/2016 discontinued secondary to intolerance   CURRENT THERAPY: Second line treatment with immunotherapy with Ketruda 200 mg IV every 3 weeks. First dose 07/30/2016.   INTERVAL HISTORY: [name] [name] 70 y.o. male returns to the clinic today for followup visit accompanied by his wife and son. The patient came to the clinic on a wheelchair. He still complaining of increasing fatigue and weakness. He also has pain in the pelvic area. He is currently on oxycodone. He was given prescription for MS Contin but has not started yet. He denied having any significant chest pain, shortness of breath but has mild cough with no hemoptysis. He lost around 4 pounds since his last visit. He denied having any nausea or vomiting. He has no fever or chills. He is here today for evaluation and discussion of his treatment options.  MEDICAL HISTORY: Past Medical History:  Diagnosis Date  . Adrenal insufficiency ([place])   . Bone cancer ([place])    L5 bone metastasis  . BPH (benign prostatic hyperplasia) 01/24/2013  . Brain cancer ([place])    4 mm solitary brain metastasis  . Cancer ([place])    lung/colon ca  . Cancer of middle lobe of lung ([place]) 01/24/2013   4.5 cm RML w associated R hilar adenopathy S/P chemo/RT then Surgery 8/08  . Chronic back pain   . Collagen vascular disease (HCC)    RA  . Colon cancer ([place]) 01/24/2013   T3N0M0  right colon  R hemicolectomy   . Dysphagia 05/27/2016  . Encounter for antineoplastic chemotherapy 03/18/2016  . GERD (gastroesophageal reflux disease)   . Headache   . HOH (hard of hearing)   .  Lung mass   . PONV (postoperative nausea and vomiting)    adrenal insufficiency; needs steriod prior to procedures  . Raynauds phenomenon   . Rheumatoid arthritis(714.0) 01/24/2013   Since age 59    ALLERGIES:  is allergic to lorazepam; aspirin; diphenhydramine hcl; and remicade [infliximab].  MEDICATIONS:  Current Outpatient Prescriptions  Medication Sig  Dispense Refill  . acetaminophen (TYLENOL) 500 MG tablet Take 1,500 mg by mouth every 6 (six) hours as needed (For pain.). Reported on 03/06/2016    . enoxaparin (LOVENOX) 40 MG/0.4ML injection Inject 0.4 mLs into the skin daily.    Marland Kitchen leflunomide (ARAVA) 10 MG tablet Take 10 mg by mouth daily.    Marland Kitchen loperamide (IMODIUM A-D) 2 MG tablet Take 2 mg by mouth 4 (four) times daily as needed for diarrhea or loose stools.    . magnesium oxide (MAG-OX) 400 MG tablet Take 400 mg by mouth daily.    . Melatonin 3 MG TABS Take 3 mg by mouth at bedtime as needed.    Marland Kitchen morphine (MS CONTIN) 30 MG 12 hr tablet Take 1 tablet (30 mg total) by mouth every 12 (twelve) hours. 60 tablet 0  . omeprazole (PRILOSEC) 20 MG capsule Take 20 mg by mouth daily.     Marland Kitchen oxyCODONE (OXY IR/ROXICODONE) 5 MG immediate release tablet Take by mouth.    . potassium chloride (K-DUR,KLOR-CON) 10 MEQ tablet Take 10 mEq by mouth 2 (two) times daily.    . predniSONE (DELTASONE) 5 MG tablet TAKE 1 TABLET BY MOUTH DAILY AS DIRECTED    . prochlorperazine (COMPAZINE) 10 MG tablet Take by mouth.     No current facility-administered medications for this visit.     SURGICAL HISTORY:  Past Surgical History:  Procedure Laterality Date  . APPENDECTOMY    . COLECTOMY  2004   right-hemi  . CYSTOSCOPY N/A 10/19/2015   Procedure: CYSTOSCOPY;  Surgeon: Carolan Clines, MD;  Location: [facility];  Service: Urology;  Laterality: N/A;  . GASTRECTOMY  1974   bleeding ulcers  . INGUINAL HERNIA REPAIR Right 06/03/2014   Procedure: HERNIA REPAIR INGUINAL ADULT;  Surgeon: Kaylyn Lim, MD;  Location: [place];  Service: General;  Laterality: Right;  . [place] SURGERY  2003  . lung lobectomy  2007   right  . tendons ruptured  2010   right-hand  . TRANSURETHRAL RESECTION OF PROSTATE N/A 10/19/2015   Procedure: TRANSURETHRAL RESECTION OF THE PROSTATE (TURP);  Surgeon: Carolan Clines, MD;  Location: [facility];  Service: Urology;   Laterality: N/A;  . VIDEO BRONCHOSCOPY WITH ENDOBRONCHIAL NAVIGATION N/A 03/04/2016   Procedure: VIDEO BRONCHOSCOPY WITH BRUSHINGS AND BIOPSY;  Surgeon: Grace Isaac, MD;  Location: [facility];  Service: Thoracic;  Laterality: N/A;    REVIEW OF SYSTEMS:  Constitutional: positive for fatigue Eyes: negative Ears, nose, mouth, throat, and face: negative Respiratory: positive for dyspnea on exertion Cardiovascular: negative Gastrointestinal: negative Genitourinary:negative Integument/breast: negative Hematologic/lymphatic: negative Musculoskeletal:positive for back pain Neurological: negative Behavioral/Psych: negative Endocrine: negative Allergic/Immunologic: negative   PHYSICAL EXAMINATION: General appearance: alert, cooperative and no distress Head: Normocephalic, without obvious abnormality, atraumatic Neck: no adenopathy, no JVD, supple, symmetrical, trachea midline and thyroid not enlarged, symmetric, no tenderness/mass/nodules Lymph nodes: Cervical, supraclavicular, and axillary nodes normal. Resp: clear to auscultation bilaterally Back: symmetric, no curvature. ROM normal. No CVA tenderness. Cardio: regular rate and rhythm, S1, S2 normal, no murmur, click, rub or gallop GI: soft, non-tender; bowel sounds normal; no masses,  no organomegaly  Extremities: extremities normal, atraumatic, no cyanosis or edema Neurologic: Alert and oriented X 3, normal strength and tone. Normal symmetric reflexes. Normal coordination and gait  ECOG PERFORMANCE STATUS: 1 - Symptomatic but completely ambulatory  Blood pressure 121/80, pulse 96, temperature 99.3 F (37.4 C), temperature source Oral, resp. rate 16, height '5\' 9"'$  (1.753 m), weight 139 lb 6.4 oz (63.2 kg), SpO2 97 %.  LABORATORY DATA: Lab Results  Component Value Date   WBC 6.2 07/23/2016   HGB 10.2 (L) 07/23/2016   HCT 30.5 (L) 07/23/2016   MCV 95.3 07/23/2016   PLT 190 07/23/2016      Chemistry      Component Value Date/Time     NA 133 (L) 06/03/2016 1410   K 3.8 06/03/2016 1410   CL 103 03/04/2016 1147   CL 98 01/15/2013 1454   CO2 24 06/03/2016 1410   BUN 7.8 06/03/2016 1410   CREATININE 0.8 06/03/2016 1410      Component Value Date/Time   CALCIUM 9.2 06/03/2016 1410   ALKPHOS 182 (H) 06/03/2016 1410   AST 16 06/03/2016 1410   ALT 19 06/03/2016 1410   BILITOT 0.55 06/03/2016 1410       RADIOGRAPHIC STUDIES: No results found. ASSESSMENT AND PLAN: This is a very pleasant 70 years old white male with  1) history of stage II colon adenocarcinoma diagnosed in 2003   2) history stage III non-small cell lung cancer diagnosed in 2008 status post dissection as well as adjuvant therapy. He has been observation since that time with no significant evidence for disease recurrence.  3) recurrent and metastatic ( stage IV) non-small cell lung cancer, squamous cell carcinoma, PDL1 expression 40%, diagnosed in June 2017 when the patient presented for routine annual exam and his blood work showed elevation of CEA up to 11.3. Last year it was 4.1.  I ordered repeat CT scan of the chest, abdomen and pelvis which was performed a few days ago and unfortunately it showed multiple new pulmonary nodules and masses concerning for either metastatic disease to the lung or multiple new synchronous primaries. There was also what appears to be an erosion through the inferior wall of the proximal left mainstem bronchus with a small amount of gas in the middle mediastinum and the subcarinal region. There was also evidence of subcarinal and lower right paratracheal lymphadenopathy. The scan also showed fat containing lesion in the distal descending colon questionable for colonic lipoma or lipomatous polyp. This was followed by a PET scan which showed hypermetabolic activity in the left lower lobe lung mass in addition to the right upper lobe pulmonary nodule as well as multiple solid and cavitary bilateral pulmonary nodules and suspicious L5  bone metastasis. 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 The patient underwent bronchoscopy under the care of Dr. Servando Snare and the final pathology is consistent with squamous cell carcinoma. He completed stereotactic radiotherapy to the solitary brain lesion under the care of Dr. Tammi Klippel.  His currently on systemic chemotherapy with carboplatin and Abraxane status post 3 cycles and tolerated the last cycle of his treatment fairly well except for fatigue. The recent CT scan of the chest, abdomen and pelvis showed improvement of his disease. The patient is so weak and he will be unable to continue with the current systemic chemotherapy with carboplatin and Abraxane. I discussed with him other options for treatment of his condition including second line treatment with immunotherapy with Ketruda (pembrolizumab) 200 mg IV every  3 weeks. The patient has PDL 1 expression of 40%. He is interested in proceeding with the treatment with [place] (pembrolizumab) and he is expected to start the first cycle of this treatment next week. I discussed with him the adverse effect of this treatment including but not limited to immune mediated skin rash, diarrhea, inflammation of the lung, kidney, liver, thyroid or other endocrine dysfunction. For the back pain, he will continue on Percocet and he was advised to take MS Contin if his pain is not well controlled. The patient would come back for follow-up visit in 4 weeks for evaluation before starting cycle #2. The patient and his wife agreed to the current plan. He was advised to call immediately if he has any concerning symptoms in the interval. The patient voices understanding of current disease status and treatment options and is in agreement with the current care plan.  All questions were answered. The patient knows to call the clinic with any problems, questions or concerns. We can certainly see the patient much sooner if  necessary.  Disclaimer: This note was dictated with voice recognition software. Similar sounding words can inadvertently be transcribed and may not be corrected upon review.

## 2016-07-23 NOTE — Telephone Encounter (Signed)
Message sent to infusion scheduler to add infusion per 07/23/16 los. Appointments scheduled per 07/23/16 los. AVS report and appointment schedule given to patient, per 07/23/16 los.

## 2016-07-30 ENCOUNTER — Ambulatory Visit (HOSPITAL_BASED_OUTPATIENT_CLINIC_OR_DEPARTMENT_OTHER): Payer: Medicare Other

## 2016-07-30 ENCOUNTER — Other Ambulatory Visit: Payer: Self-pay | Admitting: Internal Medicine

## 2016-07-30 ENCOUNTER — Other Ambulatory Visit (HOSPITAL_BASED_OUTPATIENT_CLINIC_OR_DEPARTMENT_OTHER): Payer: Medicare Other

## 2016-07-30 ENCOUNTER — Encounter: Payer: Self-pay | Admitting: *Deleted

## 2016-07-30 VITALS — BP 105/77 | HR 105 | Temp 98.0°F | Resp 16

## 2016-07-30 DIAGNOSIS — C342 Malignant neoplasm of middle lobe, bronchus or lung: Secondary | ICD-10-CM

## 2016-07-30 DIAGNOSIS — Z5112 Encounter for antineoplastic immunotherapy: Secondary | ICD-10-CM | POA: Diagnosis present

## 2016-07-30 DIAGNOSIS — Z79899 Other long term (current) drug therapy: Secondary | ICD-10-CM

## 2016-07-30 DIAGNOSIS — C3492 Malignant neoplasm of unspecified part of left bronchus or lung: Secondary | ICD-10-CM

## 2016-07-30 DIAGNOSIS — C7951 Secondary malignant neoplasm of bone: Secondary | ICD-10-CM

## 2016-07-30 DIAGNOSIS — C7931 Secondary malignant neoplasm of brain: Secondary | ICD-10-CM | POA: Diagnosis not present

## 2016-07-30 DIAGNOSIS — R5382 Chronic fatigue, unspecified: Secondary | ICD-10-CM

## 2016-07-30 LAB — COMPREHENSIVE METABOLIC PANEL
ALT: 10 U/L (ref 0–55)
ANION GAP: 12 meq/L — AB (ref 3–11)
AST: 10 U/L (ref 5–34)
Albumin: 2.5 g/dL — ABNORMAL LOW (ref 3.5–5.0)
Alkaline Phosphatase: 180 U/L — ABNORMAL HIGH (ref 40–150)
BUN: 7.9 mg/dL (ref 7.0–26.0)
CALCIUM: 9.5 mg/dL (ref 8.4–10.4)
CHLORIDE: 98 meq/L (ref 98–109)
CO2: 23 mEq/L (ref 22–29)
CREATININE: 0.7 mg/dL (ref 0.7–1.3)
EGFR: >90 mL/min/{1.73_m2} (ref >90)
Glucose: 107 mg/dl (ref 70–140)
POTASSIUM: 3.7 meq/L (ref 3.5–5.1)
Sodium: 133 mEq/L — ABNORMAL LOW (ref 136–145)
Total Bilirubin: 0.6 mg/dL (ref 0.20–1.20)
Total Protein: 6.8 g/dL (ref 6.4–8.3)

## 2016-07-30 LAB — CBC WITH DIFFERENTIAL/PLATELET
BASO%: 0.4 % (ref 0.0–2.0)
BASOS ABS: 0 10*3/uL (ref 0.0–0.1)
EOS%: 0.3 % (ref 0.0–7.0)
Eosinophils Absolute: 0 10*3/uL (ref 0.0–0.5)
HEMATOCRIT: 31.3 % — AB (ref 38.4–49.9)
HGB: 10.4 g/dL — ABNORMAL LOW (ref 13.0–17.1)
LYMPH#: 1.1 10*3/uL (ref 0.9–3.3)
LYMPH%: 16.9 % (ref 14.0–49.0)
MCH: 32.4 pg (ref 27.2–33.4)
MCHC: 33.4 g/dL (ref 32.0–36.0)
MCV: 97 fL (ref 79.3–98.0)
MONO#: 0.5 10*3/uL (ref 0.1–0.9)
MONO%: 8.2 % (ref 0.0–14.0)
NEUT#: 4.9 10*3/uL (ref 1.5–6.5)
NEUT%: 74.2 % (ref 39.0–75.0)
PLATELETS: 246 10*3/uL (ref 140–400)
RBC: 3.22 10*6/uL — ABNORMAL LOW (ref 4.20–5.82)
RDW: 16.1 % — ABNORMAL HIGH (ref 11.0–14.6)
WBC: 6.6 10*3/uL (ref 4.0–10.3)

## 2016-07-30 LAB — TSH: TSH: 0.71 m(IU)/L (ref 0.320–4.118)

## 2016-07-30 MED ORDER — SODIUM CHLORIDE 0.9 % IV SOLN
Freq: Once | INTRAVENOUS | Status: AC
  Administered 2016-07-30: 10:00:00 via INTRAVENOUS

## 2016-07-30 MED ORDER — OXYCODONE HCL 5 MG PO TABS: 5.0000 mg | ORAL_TABLET | Freq: Four times a day (QID) | ORAL | 0 refills | Status: AC | PRN

## 2016-07-30 MED ORDER — SODIUM CHLORIDE 0.9 % IV SOLN
200.0000 mg | Freq: Once | INTRAVENOUS | Status: AC
  Administered 2016-07-30: 200 mg via INTRAVENOUS
  Filled 2016-07-30: qty 8

## 2016-07-30 NOTE — Progress Notes (Signed)
Oncology Nurse Navigator Documentation  Oncology Nurse Navigator Flowsheets 07/30/2016  Navigator Location [facility]  Navigator Encounter Type Treatment/patient's wife called.  She states husband is in need of more pain medication.  He is taking 8 oxycodone a day.  I updated Dr. Julien Nordmann.  I gave patient and his wife a new prescription.  I discussed medication as well as long acting medication.  She was thankful for the help.   Patient Visit Type MedOnc  Treatment Phase Treatment  Barriers/Navigation Needs Coordination of Care;Education  Education Other  Interventions Coordination of Care;Education  Coordination of Care Other  Education Method Verbal  Acuity Level 2  Acuity Level 2 Educational needs;Other  Time Spent with Patient 30

## 2016-07-30 NOTE — Progress Notes (Signed)
Patient having increased right hip pain this morning.  States that he has only been taking Oxy-IR '5mg'$  more often.  Patient also has rx for MS Contin '30mg'$  every 12 hours that he has not been taking.  Educated patient on the importance of taking MS Contin regularly to better manage hip pain and using Oxy-IR for breakthrough pain.  Patient took Oxy-IR '5mg'$  this morning at 5:00 am and again while receiving treatment around 10:15 am.

## 2016-07-30 NOTE — Patient Instructions (Signed)
[facility] Discharge Instructions for Patients Receiving Chemotherapy  Today you received the following chemotherapy agents Kyprolis  To help prevent nausea and vomiting after your treatment, we encourage you to take your nausea medication    If you develop nausea and vomiting that is not controlled by your nausea medication, call the clinic.   BELOW ARE SYMPTOMS THAT SHOULD BE REPORTED IMMEDIATELY:  *FEVER GREATER THAN 100.5 F  *CHILLS WITH OR WITHOUT FEVER  NAUSEA AND VOMITING THAT IS NOT CONTROLLED WITH YOUR NAUSEA MEDICATION  *UNUSUAL SHORTNESS OF BREATH  *UNUSUAL BRUISING OR BLEEDING  TENDERNESS IN MOUTH AND THROAT WITH OR WITHOUT PRESENCE OF ULCERS  *URINARY PROBLEMS  *BOWEL PROBLEMS  UNUSUAL RASH Items with * indicate a potential emergency and should be followed up as soon as possible.  Feel free to call the clinic you have any questions or concerns. The clinic phone number is [phone].  Please show the CHEMO ALERT CARD at check-in to the Emergency Department and triage nurse.   

## 2016-07-31 ENCOUNTER — Telehealth: Payer: Self-pay

## 2016-07-31 NOTE — Telephone Encounter (Signed)
When got up this AM he was very dizzy. About 730. But not last night. He got up to wheelchair and got to bathroom, he then called for his wife b/c he felt like he was going to pass out. He has not passed out.  Mildly nauseated. He has drank about 1 bottle of water since yesterday. He has not been eating. He is feeling very weak. No increased diarrhea (he has short bowel) No fever. No other sx. The not eating/drinking/weakness has been last 6-8 weeks. The dizzyness makes the weakness more pronounced. S/w Dr Julien Nordmann and called wife back. Dr Julien Nordmann feels this is a hydration issue. He needs to drink more water. She will try to get him to drink more.  Wife will call if situation does not improve or worsens.

## 2016-08-01 ENCOUNTER — Telehealth: Payer: Self-pay | Admitting: *Deleted

## 2016-08-01 NOTE — Telephone Encounter (Signed)
Spouse Velva Harman called asking "what is the temperature range to call provider.  [name] [name]'s temperature is normally 97 to 98 degrees.  Temp at this time = 100.3. B/P= 92/72 and was a little dizzy yesterday." 1st Keytruda on 07-30-2016.  Denies N/V, diarrhea, cough present but unchanged and non-productive..  No trouble urinating.    Will notify provider.  Return number [phone] if any new orders.

## 2016-08-06 ENCOUNTER — Telehealth: Payer: Self-pay | Admitting: Medical Oncology

## 2016-08-06 NOTE — Telephone Encounter (Addendum)
Returned call to [facility] RN . Nurse stated [place] is  requiring more morphine and having MS changes-he is rude and mean. . RN is asking if pt is hospice appropriate. I asked her to return call as pt still on [place] and need to know if pt or family mentioned hospice. I spoke to wife and she wants me to talk to nurse. Pt is confused ,  weaker and having more pain. "is she cooking puppies today?".  Ms contin and oxycodone is not helping. He is not eating or drinking. He is having trouble finding the right words for what he wants. Per Julien Nordmann I instructed wife to give him tylenol or ibuprophen between morphine and oxycodone and if uncontrolled pain or worsening confusion to take pt to nearest ED.  Going to [facility] . I told wife if she cannot get him  to [facility] tomorrow to call back and we can put pt in [facility].

## 2016-08-07 ENCOUNTER — Telehealth: Payer: Self-pay | Admitting: Medical Oncology

## 2016-08-07 NOTE — Telephone Encounter (Signed)
Wife states they do not want hospice at this time -he wants to do another treatment to see how he does.PT on his way to [facility].

## 2016-08-07 NOTE — Telephone Encounter (Signed)
Left message for nurse that pt wants to continue tx , not hospice at this time.

## 2016-08-09 ENCOUNTER — Telehealth: Payer: Self-pay | Admitting: Medical Oncology

## 2016-08-09 ENCOUNTER — Emergency Department (HOSPITAL_COMMUNITY): Payer: Medicare Other

## 2016-08-09 ENCOUNTER — Emergency Department (HOSPITAL_COMMUNITY)
Admission: EM | Admit: 2016-08-09 | Discharge: 2016-08-09 | Disposition: A | Discharge status: Home / Self Care | Payer: Medicare Other | Attending: Emergency Medicine | Admitting: Emergency Medicine

## 2016-08-09 ENCOUNTER — Encounter (HOSPITAL_COMMUNITY): Payer: Self-pay | Admitting: Emergency Medicine

## 2016-08-09 DIAGNOSIS — M25551 Pain in right hip: Secondary | ICD-10-CM | POA: Diagnosis present

## 2016-08-09 DIAGNOSIS — C3490 Malignant neoplasm of unspecified part of unspecified bronchus or lung: Secondary | ICD-10-CM | POA: Diagnosis not present

## 2016-08-09 DIAGNOSIS — Z85830 Personal history of malignant neoplasm of bone: Secondary | ICD-10-CM | POA: Insufficient documentation

## 2016-08-09 DIAGNOSIS — Z85038 Personal history of other malignant neoplasm of large intestine: Secondary | ICD-10-CM | POA: Insufficient documentation

## 2016-08-09 DIAGNOSIS — Z87891 Personal history of nicotine dependence: Secondary | ICD-10-CM | POA: Diagnosis not present

## 2016-08-09 DIAGNOSIS — C799 Secondary malignant neoplasm of unspecified site: Secondary | ICD-10-CM

## 2016-08-09 DIAGNOSIS — Z85118 Personal history of other malignant neoplasm of bronchus and lung: Secondary | ICD-10-CM | POA: Insufficient documentation

## 2016-08-09 DIAGNOSIS — Z85841 Personal history of malignant neoplasm of brain: Secondary | ICD-10-CM | POA: Diagnosis not present

## 2016-08-09 LAB — CBC WITH DIFFERENTIAL/PLATELET
BASOS ABS: 0 10*3/uL (ref 0.0–0.1)
BASOS PCT: 0 %
EOS ABS: 0 10*3/uL (ref 0.0–0.7)
EOS PCT: 0 %
HCT: 29.1 % — ABNORMAL LOW (ref 39.0–52.0)
Hemoglobin: 9.8 g/dL — ABNORMAL LOW (ref 13.0–17.0)
LYMPHS ABS: 1 10*3/uL (ref 0.7–4.0)
Lymphocytes Relative: 13 %
MCH: 31.9 pg (ref 26.0–34.0)
MCHC: 33.7 g/dL (ref 30.0–36.0)
MCV: 94.8 fL (ref 78.0–100.0)
Monocytes Absolute: 0.7 10*3/uL (ref 0.1–1.0)
Monocytes Relative: 10 %
Neutro Abs: 5.6 10*3/uL (ref 1.7–7.7)
Neutrophils Relative %: 77 %
PLATELETS: 251 10*3/uL (ref 150–400)
RBC: 3.07 MIL/uL — AB (ref 4.22–5.81)
RDW: 14.5 % (ref 11.5–15.5)
WBC: 7.2 10*3/uL (ref 4.0–10.5)

## 2016-08-09 LAB — URINALYSIS, ROUTINE W REFLEX MICROSCOPIC
BILIRUBIN URINE: NEGATIVE
Glucose, UA: NEGATIVE mg/dL
HGB URINE DIPSTICK: NEGATIVE
Ketones, ur: 40 mg/dL — AB
Leukocytes, UA: NEGATIVE
NITRITE: NEGATIVE
PROTEIN: NEGATIVE mg/dL
Specific Gravity, Urine: 1.013 (ref 1.005–1.030)
pH: 7 (ref 5.0–8.0)

## 2016-08-09 LAB — COMPREHENSIVE METABOLIC PANEL
ALK PHOS: 151 U/L — AB (ref 38–126)
ALT: 10 U/L — AB (ref 17–63)
AST: 12 U/L — AB (ref 15–41)
Albumin: 2.7 g/dL — ABNORMAL LOW (ref 3.5–5.0)
Anion gap: 9 (ref 5–15)
BILIRUBIN TOTAL: 1 mg/dL (ref 0.3–1.2)
BUN: 8 mg/dL (ref 6–20)
CALCIUM: 8.9 mg/dL (ref 8.9–10.3)
CO2: 27 mmol/L (ref 22–32)
CREATININE: 0.55 mg/dL — AB (ref 0.61–1.24)
Chloride: 95 mmol/L — ABNORMAL LOW (ref 101–111)
Glucose, Bld: 105 mg/dL — ABNORMAL HIGH (ref 65–99)
Potassium: 4.3 mmol/L (ref 3.5–5.1)
Sodium: 131 mmol/L — ABNORMAL LOW (ref 135–145)
TOTAL PROTEIN: 6.8 g/dL (ref 6.5–8.1)

## 2016-08-09 MED ORDER — OXYCODONE HCL 5 MG PO TABS
5.0000 mg | ORAL_TABLET | Freq: Once | ORAL | Status: AC
  Administered 2016-08-09: 5 mg via ORAL
  Filled 2016-08-09: qty 1

## 2016-08-09 NOTE — ED Provider Notes (Signed)
[facility] DEPT Provider Note   CSN: [id number] Arrival date & time: 08/09/16  1432     History   Chief Complaint Chief Complaint  Patient presents with  . Hip Pain    left    HPI [name] [name] is a 70 y.o. male.  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 Patient has been having increasing problems with pain episodes. His family reports that he developed severe episodes of pain that seemed to be predominantly in the left hip. 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 They do have medications to provide however at times the patient is made overly somnolent and confused by his pain medications. They report he is not eating and drinking much and our concern for possible dehydration. Also experiences periods of confusion. Past Medical History:  Diagnosis Date  . Adrenal insufficiency ([place])   . Bone cancer ([place])    L5 bone metastasis  . BPH (benign prostatic hyperplasia) 01/24/2013  . Brain cancer ([place])    4 mm solitary brain metastasis  . Cancer ([place])    lung/colon ca  . Cancer of middle lobe of lung ([place]) 01/24/2013   4.5 cm RML w associated R hilar adenopathy S/P chemo/RT then Surgery 8/08  . Chronic back pain   . Collagen vascular disease (HCC)    RA  . Colon cancer ([place]) 01/24/2013   [id number]  right colon  R hemicolectomy   . Dysphagia 05/27/2016  . Encounter for antineoplastic chemotherapy 03/18/2016  . GERD (gastroesophageal reflux disease)   . Headache   . HOH (hard of hearing)   . Lung mass   . PONV (postoperative nausea and vomiting)    adrenal insufficiency; needs steriod prior to procedures  . Raynauds phenomenon   . Rheumatoid arthritis(714.0) 01/24/2013   Since age 48    Patient Active Problem List   Diagnosis Date Noted  . Encounter for antineoplastic immunotherapy 07/23/2016  . Dysphagia 05/27/2016  . Metastasis to brain ([place]) 03/25/2016  . Encounter for antineoplastic  chemotherapy 03/18/2016  . Malignant neoplasm of ascending colon ([place]) 01/31/2016  . Benign prostatic hypertrophy 10/20/2015  . Benign prostatic hyperplasia 10/19/2015  . Shock ([place])   . Right inguinal hernia 05/04/2014  . Other long term (current) drug therapy 01/06/2014  . Age related osteoporosis 12/22/2013  . 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 Paroxysmal digital cyanosis 12/22/2013  . Rheumatoid arthritis ([place]) 12/22/2013  . Colon cancer ([place]) 01/24/2013  . Cancer of middle lobe of lung ([place]) 01/24/2013  . Rheumatoid arthritis(714.0) 01/24/2013  . BPH (benign prostatic hyperplasia) 01/24/2013  . HTN (hypertension), benign 01/24/2013  . Anemia 08/26/2011    Past Surgical History:  Procedure Laterality Date  . APPENDECTOMY    . COLECTOMY  2004   right-hemi  . CYSTOSCOPY N/A 10/19/2015   Procedure: CYSTOSCOPY;  Surgeon: Carolan Clines, MD;  Location: [facility];  Service: Urology;  Laterality: N/A;  . GASTRECTOMY  1974   bleeding ulcers  . INGUINAL HERNIA REPAIR Right 06/03/2014   Procedure: HERNIA REPAIR INGUINAL ADULT;  Surgeon: Kaylyn Lim, MD;  Location: [facility];  Service: General;  Laterality: Right;  . [place] SURGERY  2003  . lung lobectomy  2007   right  . tendons ruptured  2010   right-hand  . TRANSURETHRAL RESECTION OF PROSTATE N/A 10/19/2015   Procedure: TRANSURETHRAL RESECTION OF THE PROSTATE (TURP);  Surgeon: Carolan Clines, MD;  Location: [facility];  Service: Urology;  Laterality: N/A;  . VIDEO BRONCHOSCOPY WITH ENDOBRONCHIAL NAVIGATION N/A 03/04/2016   Procedure: VIDEO BRONCHOSCOPY WITH BRUSHINGS AND BIOPSY;  Surgeon: Grace Isaac, MD;  Location: [facility];  Service: Thoracic;  Laterality: N/A;       Home Medications    Prior to Admission medications   Medication Sig Start Date End Date Taking? Authorizing Provider  acetaminophen (TYLENOL) 325 MG tablet Take 975 mg by mouth 3 (three) times daily as  needed.   Yes Historical Provider, MD  Ascorbic Acid (VITAMIN C PO) Take 1 tablet by mouth.   Yes Historical Provider, MD  Calcium Carbonate-Vitamin D (CALTRATE 600+D PO) Take 1 tablet by mouth.   Yes Historical Provider, MD  leflunomide (ARAVA) 10 MG tablet Take 10 mg by mouth daily.   Yes Historical Provider, MD  loperamide (IMODIUM A-D) 2 MG tablet Take 2 mg by mouth 4 (four) times daily as needed for diarrhea or loose stools.   Yes Historical Provider, MD  magnesium oxide (MAG-OX) 400 MG tablet Take 400 mg by mouth daily.   Yes Historical Provider, MD  Melatonin 3 MG TABS Take 3 mg by mouth at bedtime as needed. 06/13/16  Yes Historical Provider, MD  morphine (MS CONTIN) 30 MG 12 hr tablet Take 1 tablet (30 mg total) by mouth every 12 (twelve) hours. 07/08/16  Yes Hayden Pedro, PA-C  omeprazole (PRILOSEC) 20 MG capsule Take 20 mg by mouth daily.    Yes Historical Provider, MD  oxyCODONE (OXY IR/ROXICODONE) 5 MG immediate release tablet Take 1 tablet (5 mg total) by mouth every 6 (six) hours as needed for severe pain. 07/30/16  Yes Curt Bears, MD  potassium chloride (K-DUR,KLOR-CON) 10 MEQ tablet Take 10 mEq by mouth 2 (two) times daily.   Yes Historical Provider, MD  predniSONE (DELTASONE) 5 MG tablet TAKE 1 TABLET BY MOUTH DAILY AS DIRECTED 03/11/16  Yes Historical Provider, MD  prochlorperazine (COMPAZINE) 10 MG tablet Take 10 mg by mouth daily as needed for nausea or vomiting.  03/18/16  Yes Historical Provider, MD  VITAMIN E PO Take 1 tablet by mouth.   Yes Historical Provider, MD    Family History Family History  Problem Relation Age of Onset  . Cancer Sister   . Cancer Brother   . Colon cancer Neg Hx   . Pancreatic cancer Neg Hx   . Stomach cancer Neg Hx     Social History Social History  Substance Use Topics  . Smoking status: Former Smoker    Quit date: 05/30/2006  . Smokeless tobacco: Never Used  . Alcohol use 11.4 oz/week    12 Cans of beer, 7 Shots of liquor  per week     Comment: drinks beer 5 days a week     Allergies   Lorazepam; Aspirin; Diphenhydramine hcl; and Remicade [infliximab]   Review of Systems Review of Systems 10 Systems reviewed and are negative for acute change except as noted in the HPI.   Physical Exam Updated Vital Signs BP (!) 72/46 (BP Location: Right Arm)   Pulse 87   Temp 98.4 F (36.9 C) (Oral)   Resp (!) 104   Ht '5\' 9"'$  (1.753 m)   Wt 139 lb (63 kg)   SpO2 95%   BMI 20.53 kg/m   Physical Exam  Constitutional:  Patient is alert and in no respiratory distress. Color is slightly pale. At this time he is pleasantly interactive.  HENT:  Head:  Normocephalic and atraumatic.  Mouth/Throat: Oropharynx is clear and moist.  Eyes: EOM are normal.  Cardiovascular: Normal rate, regular rhythm, normal heart sounds and intact distal pulses.   Pulmonary/Chest: Effort normal.  Diminished breath sounds but no gross rhonchi or wheezing.  Abdominal: Soft. He exhibits no distension. There is no tenderness. There is no guarding.  Musculoskeletal: Normal range of motion. He exhibits no edema.  Patient has an incision on the left lateral ankle is healing very well. He reportedly had prior fracture. There is no significant edema. There is no erythema no signs of secondary infection. The calves are soft and nontender. Patient can perform range of motion of both hips and push against resistance without evidence of pain at this time. Patient is not endorsing focal bony point tenderness to palpation of the spine.  Neurological: He is alert.  Patient is alert. He seems mildly hypervigilant. He admits to some degree of confusion in providing history. No focal motor deficits.  Skin: Skin is warm and dry. There is pallor.  Psychiatric: He has a normal mood and affect.     ED Treatments / Results  Labs (all labs ordered are listed, but only abnormal results are displayed) Labs Reviewed  COMPREHENSIVE METABOLIC PANEL - Abnormal;  Notable for the following:       Result Value   Sodium 131 (*)    Chloride 95 (*)    Glucose, Bld 105 (*)    Creatinine, Ser 0.55 (*)    Albumin 2.7 (*)    AST 12 (*)    ALT 10 (*)    Alkaline Phosphatase 151 (*)    All other components within normal limits  CBC WITH DIFFERENTIAL/PLATELET - Abnormal; Notable for the following:    RBC 3.07 (*)    Hemoglobin 9.8 (*)    HCT 29.1 (*)    All other components within normal limits  URINALYSIS, ROUTINE W REFLEX MICROSCOPIC (NOT AT [facility]) - Abnormal; Notable for the following:    Ketones, ur 40 (*)    All other components within normal limits    EKG  EKG Interpretation None       Radiology Dg Hip Unilat With Pelvis 2-3 Views Left  Result Date: 08/09/2016 CLINICAL DATA:  LEFT hip pain, no recent injury, fell on September EXAM: DG HIP (WITH OR WITHOUT PELVIS) 2-3V LEFT COMPARISON:  06/06/2016 FINDINGS: Hip and SI joints symmetric and preserved. Marked osseous demineralization. No acute fracture, dislocation, or bone destruction. Scattered atherosclerotic calcification. IMPRESSION: Osseous demineralization without acute bony abnormality. Electronically Signed   By: Lavonia Dana M.D.   On: 08/09/2016 16:29    Procedures Procedures (including critical care time)  Medications Ordered in ED Medications  oxyCODONE (Oxy IR/ROXICODONE) immediate release tablet 5 mg (5 mg Oral Given 08/09/16 1613)     Initial Impression / Assessment and Plan / ED Course  I have reviewed the triage vital signs and the nursing notes.  Pertinent labs & imaging results that were available during my care of the patient were reviewed by me and considered in my medical decision making (see chart for details).  Clinical Course     Final Clinical Impressions(s) / ED Diagnoses   Final diagnoses:  Right hip pain  Metastatic cancer [facility])   Patient presents with pain control issues and family concern for possible dehydration. Patient did not appear to be in  pain during the time of my evaluation and his observation in the emergency department. He does show a subtle degree  of confusion although he is very alert. My suspicion is that when the patient is having pain, he also develops confusion and more extreme responses. X-ray does not show any acutely erosive lesions in the left hip which reportedly is area of most pain. I counseled family members on consistent timing of pain medications. They do feel that this worsens his confusion and somnolence. We did review risks and benefits of pain management and the necessity to address this again with oncology as well. Labs do not show evidence of dehydration. Clinically and by vital signs patient does not show dehydration. New Prescriptions New Prescriptions   No medications on file     Charlesetta Shanks, MD 08/09/16 1948

## 2016-08-09 NOTE — ED Notes (Signed)
Pt found sitting in room with family

## 2016-08-09 NOTE — ED Triage Notes (Signed)
Pt presents with [place] cancer card. Family states pt had fall in September and has been wearing a boot on his left foot, pt has been nonweightbearing with that. Pt started new cancer treatment and stronger pain medication about 2 weeks ago and state pt has been more confused each day.  Pt very restless in bed. Pt took oxycodone at 0530 and morphine at 1145 today.

## 2016-08-09 NOTE — Telephone Encounter (Signed)
I spoke wife -pt still confused , calling out, in pain. I can hear him in background moaning.   I instructed her to call  [phone] to take pt to hospital  [facility] or [facility]-

## 2016-08-20 ENCOUNTER — Ambulatory Visit (HOSPITAL_BASED_OUTPATIENT_CLINIC_OR_DEPARTMENT_OTHER): Payer: Medicare Other

## 2016-08-20 ENCOUNTER — Ambulatory Visit (HOSPITAL_BASED_OUTPATIENT_CLINIC_OR_DEPARTMENT_OTHER): Payer: Medicare Other | Admitting: Internal Medicine

## 2016-08-20 ENCOUNTER — Encounter: Payer: Self-pay | Admitting: Internal Medicine

## 2016-08-20 ENCOUNTER — Other Ambulatory Visit (HOSPITAL_BASED_OUTPATIENT_CLINIC_OR_DEPARTMENT_OTHER): Payer: Medicare Other

## 2016-08-20 VITALS — BP 100/56 | HR 55 | Temp 98.0°F | Resp 16 | Ht 69.0 in | Wt 128.3 lb

## 2016-08-20 DIAGNOSIS — E46 Unspecified protein-calorie malnutrition: Secondary | ICD-10-CM | POA: Diagnosis not present

## 2016-08-20 DIAGNOSIS — C7931 Secondary malignant neoplasm of brain: Secondary | ICD-10-CM | POA: Diagnosis not present

## 2016-08-20 DIAGNOSIS — C7951 Secondary malignant neoplasm of bone: Secondary | ICD-10-CM | POA: Diagnosis not present

## 2016-08-20 DIAGNOSIS — D6481 Anemia due to antineoplastic chemotherapy: Secondary | ICD-10-CM

## 2016-08-20 DIAGNOSIS — C3492 Malignant neoplasm of unspecified part of left bronchus or lung: Secondary | ICD-10-CM

## 2016-08-20 DIAGNOSIS — C342 Malignant neoplasm of middle lobe, bronchus or lung: Secondary | ICD-10-CM

## 2016-08-20 DIAGNOSIS — M25552 Pain in left hip: Secondary | ICD-10-CM | POA: Diagnosis not present

## 2016-08-20 DIAGNOSIS — R634 Abnormal weight loss: Secondary | ICD-10-CM

## 2016-08-20 DIAGNOSIS — Z5112 Encounter for antineoplastic immunotherapy: Secondary | ICD-10-CM

## 2016-08-20 DIAGNOSIS — Z5111 Encounter for antineoplastic chemotherapy: Secondary | ICD-10-CM

## 2016-08-20 DIAGNOSIS — Z79899 Other long term (current) drug therapy: Secondary | ICD-10-CM

## 2016-08-20 DIAGNOSIS — M549 Dorsalgia, unspecified: Secondary | ICD-10-CM | POA: Diagnosis not present

## 2016-08-20 DIAGNOSIS — G893 Neoplasm related pain (acute) (chronic): Secondary | ICD-10-CM

## 2016-08-20 DIAGNOSIS — R5382 Chronic fatigue, unspecified: Secondary | ICD-10-CM

## 2016-08-20 HISTORY — DX: Neoplasm related pain (acute) (chronic): G89.3

## 2016-08-20 LAB — COMPREHENSIVE METABOLIC PANEL
ALBUMIN: 2.3 g/dL — AB (ref 3.5–5.0)
ALK PHOS: 136 U/L (ref 40–150)
ALT: 6 U/L (ref 0–55)
AST: 9 U/L (ref 5–34)
Anion Gap: 12 mEq/L — ABNORMAL HIGH (ref 3–11)
BILIRUBIN TOTAL: 0.58 mg/dL (ref 0.20–1.20)
BUN: 10.9 mg/dL (ref 7.0–26.0)
CO2: 27 mEq/L (ref 22–29)
Calcium: 9.3 mg/dL (ref 8.4–10.4)
Chloride: 95 mEq/L — ABNORMAL LOW (ref 98–109)
Creatinine: 0.6 mg/dL — ABNORMAL LOW (ref 0.7–1.3)
EGFR: >90 mL/min/{1.73_m2} (ref >90)
GLUCOSE: 89 mg/dL (ref 70–140)
Potassium: 3.6 mEq/L (ref 3.5–5.1)
SODIUM: 134 meq/L — AB (ref 136–145)
TOTAL PROTEIN: 6.7 g/dL (ref 6.4–8.3)

## 2016-08-20 LAB — CBC WITH DIFFERENTIAL/PLATELET
BASO%: 0.8 % (ref 0.0–2.0)
Basophils Absolute: 0.1 10*3/uL (ref 0.0–0.1)
EOS ABS: 0 10*3/uL (ref 0.0–0.5)
EOS%: 0.7 % (ref 0.0–7.0)
HCT: 30.2 % — ABNORMAL LOW (ref 38.4–49.9)
HEMOGLOBIN: 9.9 g/dL — AB (ref 13.0–17.1)
LYMPH%: 15 % (ref 14.0–49.0)
MCH: 31 pg (ref 27.2–33.4)
MCHC: 32.7 g/dL (ref 32.0–36.0)
MCV: 95 fL (ref 79.3–98.0)
MONO#: 0.6 10*3/uL (ref 0.1–0.9)
MONO%: 8.4 % (ref 0.0–14.0)
NEUT%: 75.1 % — ABNORMAL HIGH (ref 39.0–75.0)
NEUTROS ABS: 5.1 10*3/uL (ref 1.5–6.5)
Platelets: 272 10*3/uL (ref 140–400)
RBC: 3.17 10*6/uL — ABNORMAL LOW (ref 4.20–5.82)
RDW: 14.9 % — ABNORMAL HIGH (ref 11.0–14.6)
WBC: 6.8 10*3/uL (ref 4.0–10.3)
lymph#: 1 10*3/uL (ref 0.9–3.3)

## 2016-08-20 LAB — TSH: TSH: 1.091 m[IU]/L (ref 0.320–4.118)

## 2016-08-20 MED ORDER — MORPHINE SULFATE ER 15 MG PO TBCR: 15.0000 mg | EXTENDED_RELEASE_TABLET | Freq: Two times a day (BID) | ORAL | 0 refills | Status: DC

## 2016-08-20 MED ORDER — SODIUM CHLORIDE 0.9 % IV SOLN
Freq: Once | INTRAVENOUS | Status: AC
  Administered 2016-08-20: 11:00:00 via INTRAVENOUS

## 2016-08-20 MED ORDER — DRONABINOL 2.5 MG PO CAPS: 2.5000 mg | ORAL_CAPSULE | Freq: Two times a day (BID) | ORAL | 0 refills | Status: AC

## 2016-08-20 MED ORDER — SODIUM CHLORIDE 0.9 % IV SOLN
200.0000 mg | Freq: Once | INTRAVENOUS | Status: AC
  Administered 2016-08-20: 200 mg via INTRAVENOUS
  Filled 2016-08-20: qty 8

## 2016-08-20 NOTE — Patient Instructions (Signed)
[facility] Discharge Instructions for Patients Receiving Chemotherapy  Today you received the following chemotherapy agents Keytruda  To help prevent nausea and vomiting after your treatment, we encourage you to take your nausea medication    If you develop nausea and vomiting that is not controlled by your nausea medication, call the clinic.   BELOW ARE SYMPTOMS THAT SHOULD BE REPORTED IMMEDIATELY:  *FEVER GREATER THAN 100.5 F  *CHILLS WITH OR WITHOUT FEVER  NAUSEA AND VOMITING THAT IS NOT CONTROLLED WITH YOUR NAUSEA MEDICATION  *UNUSUAL SHORTNESS OF BREATH  *UNUSUAL BRUISING OR BLEEDING  TENDERNESS IN MOUTH AND THROAT WITH OR WITHOUT PRESENCE OF ULCERS  *URINARY PROBLEMS  *BOWEL PROBLEMS  UNUSUAL RASH Items with * indicate a potential emergency and should be followed up as soon as possible.  Feel free to call the clinic you have any questions or concerns. The clinic phone number is [phone].  Please show the CHEMO ALERT CARD at check-in to the Emergency Department and triage nurse.   

## 2016-08-20 NOTE — Progress Notes (Signed)
[place] [phone]   [phone]  OFFICE PROGRESS NOTE  [name], MD [place] [address]  DIAGNOSIS:  1) Stage IV (T3, T3, M1b) non-small cell lung cancer, squamous cell carcinoma (PDL1 expression 40%), presented with large left lower lobe lung mass in addition to pulmonary nodule in the right upper lobe as well as multiple solid and cavitary nodules bilaterally in addition to mediastinal, right supraclavicular and right axillary lymphadenopathy as well as bone and brain metastasis, diagnosed in June 2017. PDL 1 expression is a still pending.  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 (Pathologic T3, N0, M0). 1.5 cm primary. No vascular or lymphatic invasion.  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 4.2 cm cavitary right middle lobe lesion with associated right hilar adenopathy which was PET avid.   PRIOR THERAPY: 1) status post right hemicolectomy 06/09/2002. (Pathologic T3, N0, M0). 1.5 cm primary. No vascular or lymphatic invasion followed by  adjuvant chemotherapy with 6 cycles of 5-fluorouracil plus leucovorin. 2) status post right middle lobe resection by Dr. Arlyce Dice on 08/20/2007 following neoadjuvant chemoradiation. He received weekly carboplatin and Taxol during radiation. Radiation given between August 4 and 06/08/2007. 36 gray to the primary tumor. Boost to 45 gray. 2 additional soft tissue deposits in the right middle lobe satellite lesions versus second primary also treated with a final total tumor dose of 65 gray. 3) Systemic chemotherapy with carboplatin for AUC of 5 on day 1 and Abraxane 100 MG/M2 on days 1 and 8 every 3 weeks. First dose 03/25/2016. Status post  4 cycles. Last dose was given 06/03/2016 discontinued secondary to intolerance   CURRENT THERAPY: Second line treatment with Ketruda 200 mg IV every 3 weeks, status post 1 cycle.  INTERVAL HISTORY: [name] [name] 70 y.o. male returns to the clinic today for follow-up visit accompanied by his wife. The patient has several issues today including pain in the left hip area with radiation to the left lower extremity. He received palliative radiotherapy to this area before. He is currently on MS Contin 30 mg by mouth twice a day in addition to oxycodone for breakthrough pain but he gets confused and sleepy with the high-dose of the MS Contin. He also has lack of appetite and lost around 10 pounds since his last visit. His activity is very limited. He tolerated the first round of the immunotherapy fairly well. He has shortness breath with exertion. He denied having any significant chest pain, cough or hemoptysis. He denied having any nausea or vomiting, no fever or chills. He denied having any skin rash or diarrhea. He is here today for evaluation before starting cycle #2 of his immunotherapy.  MEDICAL HISTORY: Past Medical History:  Diagnosis Date  . Adrenal insufficiency ([facility])   . Bone cancer ([place])    L5 bone metastasis  . BPH (benign prostatic hyperplasia) 01/24/2013  . Brain cancer ([place])    4 mm solitary brain metastasis  . Cancer ([place])    lung/colon ca  . Cancer of middle lobe of lung ([place]) 01/24/2013   4.5 cm RML w associated R hilar adenopathy S/P chemo/RT then Surgery 8/08  . Chronic back pain   . Collagen vascular disease (HCC)    RA  . Colon  cancer (HCC) 01/24/2013   T3N0M0  right colon  R hemicolectomy   . Dysphagia 05/27/2016  . Encounter for antineoplastic chemotherapy 03/18/2016  . GERD (gastroesophageal reflux disease)   . Headache   . HOH (hard of hearing)   . Lung mass   . PONV (postoperative nausea and vomiting)    adrenal insufficiency; needs steriod prior to procedures  .  Raynauds phenomenon   . Rheumatoid arthritis(714.0) 01/24/2013   Since age 11    ALLERGIES:  is allergic to lorazepam; aspirin; diphenhydramine hcl; and remicade [infliximab].  MEDICATIONS:  Current Outpatient Prescriptions  Medication Sig Dispense Refill  . acetaminophen (TYLENOL) 325 MG tablet Take 975 mg by mouth 3 (three) times daily as needed.    . Ascorbic Acid (VITAMIN C PO) Take 1 tablet by mouth.    . Calcium Carbonate-Vitamin D (CALTRATE 600+D PO) Take 1 tablet by mouth.    . leflunomide (ARAVA) 10 MG tablet Take 10 mg by mouth daily.    Marland Kitchen loperamide (IMODIUM A-D) 2 MG tablet Take 2 mg by mouth 4 (four) times daily as needed for diarrhea or loose stools.    . magnesium oxide (MAG-OX) 400 MG tablet Take 400 mg by mouth daily.    . Melatonin 3 MG TABS Take 3 mg by mouth at bedtime as needed.    Marland Kitchen morphine (MS CONTIN) 30 MG 12 hr tablet Take 1 tablet (30 mg total) by mouth every 12 (twelve) hours. 60 tablet 0  . omeprazole (PRILOSEC) 20 MG capsule Take 20 mg by mouth daily.     Marland Kitchen oxyCODONE (OXY IR/ROXICODONE) 5 MG immediate release tablet Take 1 tablet (5 mg total) by mouth every 6 (six) hours as needed for severe pain. 90 tablet 0  . potassium chloride (K-DUR,KLOR-CON) 10 MEQ tablet Take 10 mEq by mouth 2 (two) times daily.    . predniSONE (DELTASONE) 5 MG tablet TAKE 1 TABLET BY MOUTH DAILY AS DIRECTED    . prochlorperazine (COMPAZINE) 10 MG tablet Take 10 mg by mouth daily as needed for nausea or vomiting.     Marland Kitchen VITAMIN E PO Take 1 tablet by mouth.     No current facility-administered medications for this visit.     SURGICAL HISTORY:  Past Surgical History:  Procedure Laterality Date  . APPENDECTOMY    . COLECTOMY  2004   right-hemi  . CYSTOSCOPY N/A 10/19/2015   Procedure: CYSTOSCOPY;  Surgeon: Jethro Bolus, MD;  Location: [facility];  Service: Urology;  Laterality: N/A;  . GASTRECTOMY  1974   bleeding ulcers  . INGUINAL HERNIA REPAIR Right 06/03/2014   Procedure:  HERNIA REPAIR INGUINAL ADULT;  Surgeon: Wenda Low, MD;  Location: [facility];  Service: General;  Laterality: Right;  . LUMBAR DISC SURGERY  2003  . lung lobectomy  2007   right  . tendons ruptured  2010   right-hand  . TRANSURETHRAL RESECTION OF PROSTATE N/A 10/19/2015   Procedure: TRANSURETHRAL RESECTION OF THE PROSTATE (TURP);  Surgeon: Jethro Bolus, MD;  Location: [facility];  Service: Urology;  Laterality: N/A;  . VIDEO BRONCHOSCOPY WITH ENDOBRONCHIAL NAVIGATION N/A 03/04/2016   Procedure: VIDEO BRONCHOSCOPY WITH BRUSHINGS AND BIOPSY;  Surgeon: Delight Ovens, MD;  Location: [facility];  Service: Thoracic;  Laterality: N/A;    REVIEW OF SYSTEMS:  Constitutional: positive for anorexia, fatigue and weight loss Eyes: negative Ears, nose, mouth, throat, and face: negative Respiratory: positive for dyspnea on exertion Cardiovascular: negative Gastrointestinal: negative Genitourinary:negative Integument/breast: negative Hematologic/lymphatic:  negative Musculoskeletal:positive for bone pain and muscle weakness Neurological: negative Behavioral/Psych: negative Endocrine: negative Allergic/Immunologic: negative   PHYSICAL EXAMINATION: General appearance: alert, cooperative, distracted, fatigued and no distress Head: Normocephalic, without obvious abnormality, atraumatic Neck: no adenopathy, no JVD, supple, symmetrical, trachea midline and thyroid not enlarged, symmetric, no tenderness/mass/nodules Lymph nodes: Cervical, supraclavicular, and axillary nodes normal. Resp: clear to auscultation bilaterally Back: symmetric, no curvature. ROM normal. No CVA tenderness. Cardio: regular rate and rhythm, S1, S2 normal, no murmur, click, rub or gallop GI: soft, non-tender; bowel sounds normal; no masses,  no organomegaly Extremities: extremities normal, atraumatic, no cyanosis or edema Neurologic: Alert and oriented X 3, normal strength and tone. Normal symmetric reflexes. Normal  coordination and gait  ECOG PERFORMANCE STATUS: 2 - Symptomatic, <50% confined to bed  Blood pressure (!) 100/56, pulse (!) 55, temperature 98 F (36.7 C), temperature source Oral, resp. rate 16, height 5\' 9"  (1.753 m), weight 128 lb 4.8 oz (58.2 kg), SpO2 100 %.  LABORATORY DATA: Lab Results  Component Value Date   WBC 6.8 08/20/2016   HGB 9.9 (L) 08/20/2016   HCT 30.2 (L) 08/20/2016   MCV 95.0 08/20/2016   PLT 272 08/20/2016      Chemistry      Component Value Date/Time   NA 134 (L) 08/20/2016 0947   K 3.6 08/20/2016 0947   CL 95 (L) 08/09/2016 1631   CL 98 01/15/2013 1454   CO2 27 08/20/2016 0947   BUN 10.9 08/20/2016 0947   CREATININE 0.6 (L) 08/20/2016 0947      Component Value Date/Time   CALCIUM 9.3 08/20/2016 0947   ALKPHOS 136 08/20/2016 0947   AST 9 08/20/2016 0947   ALT 6 08/20/2016 0947   BILITOT 0.58 08/20/2016 0947       RADIOGRAPHIC STUDIES: Dg Hip Unilat With Pelvis 2-3 Views Left  Result Date: 08/09/2016 CLINICAL DATA:  LEFT hip pain, no recent injury, fell on September EXAM: DG HIP (WITH OR WITHOUT PELVIS) 2-3V LEFT COMPARISON:  06/06/2016 FINDINGS: Hip and SI joints symmetric and preserved. Marked osseous demineralization. No acute fracture, dislocation, or bone destruction. Scattered atherosclerotic calcification. IMPRESSION: Osseous demineralization without acute bony abnormality. Electronically Signed   By: 06/08/2016 M.D.   On: 08/09/2016 16:29    ASSESSMENT AND PLAN: This is a very pleasant 70 years old white male with multiple medical problems including: 1) stage IV non-small cell lung cancer, squamous cell carcinoma with PDL 1 expression of 40%. He is status post concurrent chemoradiation followed by systemic chemotherapy with carboplatin and Abraxane discontinued secondary to intolerance. He is currently undergoing treatment with immunotherapy with 66 status post 1 cycle. He tolerated the first cycle of the treatment well. I recommended  for the patient to proceed with cycle #2 today as a scheduled. 2) back and left hip pain: His pain is well controlled with the current medication of MS Contin 30 mg by mouth every 12 hours in addition to oxycodone for breakthrough pain but unfortunately the patient gets confused and sleepy all the time with the current regimen. I recommended for him to reduce the dose of MS Contin to 15 mg by mouth twice a day and he will continue to use oxycodone for breakthrough pain. 3) weight loss and malnutrition.: I encouraged the patient to increase his oral intake of food and supplements. I will also start him on Marinol 2.5 mg by mouth twice a day. 4) chemotherapy-induced anemia: Will continue to monitor for now and consider the  patient for PRBCs transfusion if needed. He would come back for follow-up visit in 3 weeks for reevaluation before starting the next cycle of his treatment. The patient voices understanding of current disease status and treatment options and is in agreement with the current care plan.  All questions were answered. The patient knows to call the clinic with any problems, questions or concerns. We can certainly see the patient much sooner if necessary.  Disclaimer: This note was dictated with voice recognition software. Similar sounding words can inadvertently be transcribed and may not be corrected upon review.

## 2016-08-22 ENCOUNTER — Telehealth: Payer: Self-pay | Admitting: Medical Oncology

## 2016-08-22 NOTE — Telephone Encounter (Addendum)
Prior Auth for marinol placed in managed care basket.

## 2016-08-23 ENCOUNTER — Encounter: Payer: Self-pay | Admitting: Internal Medicine

## 2016-08-23 NOTE — Progress Notes (Signed)
Called [facility]([place]) to advise of approval for Dronabinol.

## 2016-08-23 NOTE — Progress Notes (Signed)
Received PA request for Dronabinol. Called [employer] to initiate PA. Obtained answer for diagnosis from physician.   PA approved for Dronabinol 08/23/16-02/23/17 per [facility] pharmacist). Letter of approval received via fax.

## 2016-08-26 ENCOUNTER — Telehealth: Payer: Self-pay | Admitting: Medical Oncology

## 2016-08-26 NOTE — Telephone Encounter (Signed)
PA faxed back for marinol.

## 2016-08-29 ENCOUNTER — Telehealth: Payer: Self-pay | Admitting: Medical Oncology

## 2016-08-29 ENCOUNTER — Emergency Department
Admission: EM | Admit: 2016-08-29 | Discharge: 2016-08-29 | Disposition: A | Discharge status: Home / Self Care | Payer: Medicare Other | Attending: Emergency Medicine | Admitting: Emergency Medicine

## 2016-08-29 ENCOUNTER — Other Ambulatory Visit: Payer: Self-pay

## 2016-08-29 DIAGNOSIS — Z85841 Personal history of malignant neoplasm of brain: Secondary | ICD-10-CM | POA: Insufficient documentation

## 2016-08-29 DIAGNOSIS — Z79899 Other long term (current) drug therapy: Secondary | ICD-10-CM | POA: Insufficient documentation

## 2016-08-29 DIAGNOSIS — Z87891 Personal history of nicotine dependence: Secondary | ICD-10-CM | POA: Diagnosis not present

## 2016-08-29 DIAGNOSIS — E860 Dehydration: Secondary | ICD-10-CM | POA: Diagnosis not present

## 2016-08-29 DIAGNOSIS — Z85830 Personal history of malignant neoplasm of bone: Secondary | ICD-10-CM | POA: Insufficient documentation

## 2016-08-29 DIAGNOSIS — I10 Essential (primary) hypertension: Secondary | ICD-10-CM | POA: Diagnosis not present

## 2016-08-29 DIAGNOSIS — Z85118 Personal history of other malignant neoplasm of bronchus and lung: Secondary | ICD-10-CM | POA: Diagnosis not present

## 2016-08-29 DIAGNOSIS — Z85038 Personal history of other malignant neoplasm of large intestine: Secondary | ICD-10-CM | POA: Insufficient documentation

## 2016-08-29 LAB — URINALYSIS, COMPLETE (UACMP) WITH MICROSCOPIC
BACTERIA UA: NONE SEEN
Bilirubin Urine: NEGATIVE
Glucose, UA: NEGATIVE mg/dL
HGB URINE DIPSTICK: NEGATIVE
Ketones, ur: 20 mg/dL — AB
Leukocytes, UA: NEGATIVE
NITRITE: NEGATIVE
PH: 6 (ref 5.0–8.0)
Protein, ur: 30 mg/dL — AB
SPECIFIC GRAVITY, URINE: 1.018 (ref 1.005–1.030)

## 2016-08-29 LAB — CBC WITH DIFFERENTIAL/PLATELET
BASOS ABS: 0 10*3/uL (ref 0–0.1)
BASOS PCT: 1 %
EOS PCT: 1 %
Eosinophils Absolute: 0 10*3/uL (ref 0–0.7)
HEMATOCRIT: 30.4 % — AB (ref 40.0–52.0)
Hemoglobin: 10.1 g/dL — ABNORMAL LOW (ref 13.0–18.0)
Lymphocytes Relative: 14 %
Lymphs Abs: 0.9 10*3/uL — ABNORMAL LOW (ref 1.0–3.6)
MCH: 31 pg (ref 26.0–34.0)
MCHC: 33.2 g/dL (ref 32.0–36.0)
MCV: 93.5 fL (ref 80.0–100.0)
MONO ABS: 0.5 10*3/uL (ref 0.2–1.0)
MONOS PCT: 7 %
NEUTROS ABS: 4.8 10*3/uL (ref 1.4–6.5)
Neutrophils Relative %: 77 %
PLATELETS: 272 10*3/uL (ref 150–440)
RBC: 3.25 MIL/uL — ABNORMAL LOW (ref 4.40–5.90)
RDW: 15 % — AB (ref 11.5–14.5)
WBC: 6.2 10*3/uL (ref 3.8–10.6)

## 2016-08-29 LAB — COMPREHENSIVE METABOLIC PANEL
ALBUMIN: 2.3 g/dL — AB (ref 3.5–5.0)
ALK PHOS: 98 U/L (ref 38–126)
ALT: 8 U/L — ABNORMAL LOW (ref 17–63)
AST: 12 U/L — AB (ref 15–41)
Anion gap: 9 (ref 5–15)
BILIRUBIN TOTAL: 0.7 mg/dL (ref 0.3–1.2)
BUN: 15 mg/dL (ref 6–20)
CALCIUM: 8.9 mg/dL (ref 8.9–10.3)
CO2: 29 mmol/L (ref 22–32)
CREATININE: 0.66 mg/dL (ref 0.61–1.24)
Chloride: 99 mmol/L — ABNORMAL LOW (ref 101–111)
GFR calc Af Amer: >60 mL/min (ref >60)
GLUCOSE: 110 mg/dL — AB (ref 65–99)
POTASSIUM: 3.7 mmol/L (ref 3.5–5.1)
Sodium: 137 mmol/L (ref 135–145)
TOTAL PROTEIN: 6.5 g/dL (ref 6.5–8.1)

## 2016-08-29 LAB — TROPONIN I

## 2016-08-29 MED ORDER — SODIUM CHLORIDE 0.9 % IV BOLUS (SEPSIS)
1000.0000 mL | Freq: Once | INTRAVENOUS | Status: AC
  Administered 2016-08-29: 1000 mL via INTRAVENOUS

## 2016-08-29 NOTE — ED Notes (Signed)
Was able to get urine using a 6 french in and out cath.

## 2016-08-29 NOTE — ED Notes (Signed)
Pt states he had "no luck" when he tried to urinate but will try again in 10 minutes.

## 2016-08-29 NOTE — Telephone Encounter (Signed)
Wife reports for past 2 days pt not eating and only drinking sips of water. He asked for an ice cream sandwich and when she brought it to him he said "Why did you bring me that?" . He has periods of confusion. She has to take him to bathroom . He only voided once  yesterday , dark urine. He is coughing more. Marinol is not helping his appetite.Pain controlled with current meds. I called [facility] home care and spoke to [facility] and she will call wife and make a visit and direct pt to ED vs discuss Hospice.

## 2016-08-29 NOTE — ED Provider Notes (Signed)
[facility] Emergency Department Provider Note ____________________________________________   I have reviewed the triage vital signs and the triage nursing note.  HISTORY  Chief Complaint Dehydration   Historian Patient, wife and daughter  HPI [name] [name] is a 70 y.o. male with a history of metastatic lung/bone cancer (follows with oncology in [place]), here with familyfor decreased po intake for a few weeks.  One week ago, started an appetite boosting medication from oncologist, but no improvement.  Wife states over past 3 days, only took sips of water with meds, no solid (or otherwise) food/nutrition.  Patient says he has no appetite.  Family reports he had as have some confusion, which has been ongoing. In any case, they're not really able to impress on him to eat and stay hydrated.  Reported home health nurse checked on him and saw that the urine was very dark and suggested ED evaluation for dehydration.  Patient states he has pain in his left shoulder and left hip, this is chronic from the cancer. He takes morphine twice daily and oxycodone as needed for acute pain relief.    Past Medical History:  Diagnosis Date  . Adrenal insufficiency ([place])   . Bone cancer ([place])    L5 bone metastasis  . BPH (benign prostatic hyperplasia) 01/24/2013  . Brain cancer ([place])    4 mm solitary brain metastasis  . Cancer ([place])    lung/colon ca  . Cancer associated pain 08/20/2016  . Cancer of middle lobe of lung ([place]) 01/24/2013   4.5 cm RML w associated R hilar adenopathy S/P chemo/RT then Surgery 8/08  . Chronic back pain   . Collagen vascular disease (HCC)    RA  . Colon cancer ([place]) 01/24/2013   T3N0M0  right colon  R hemicolectomy   . Dysphagia 05/27/2016  . Encounter for antineoplastic chemotherapy 03/18/2016  . GERD (gastroesophageal reflux disease)   . Headache   . HOH (hard of hearing)   . Lung mass   . PONV (postoperative nausea and vomiting)     adrenal insufficiency; needs steriod prior to procedures  . Raynauds phenomenon   . Rheumatoid arthritis(714.0) 01/24/2013   Since age 4    Patient Active Problem List   Diagnosis Date Noted  . Cancer associated pain 08/20/2016  . Encounter for antineoplastic immunotherapy 07/23/2016  . Dysphagia 05/27/2016  . Metastasis to brain ([place]) 03/25/2016  . Encounter for antineoplastic chemotherapy 03/18/2016  . Malignant neoplasm of ascending colon ([place]) 01/31/2016  . Benign prostatic hypertrophy 10/20/2015  . Benign prostatic hyperplasia 10/19/2015  . Shock ([place])   . Right inguinal hernia 05/04/2014  . Other long term (current) drug therapy 01/06/2014  . Age related osteoporosis 12/22/2013  . Stage IV squamous cell carcinoma of left lung ([place]) 12/22/2013  . Paroxysmal digital cyanosis 12/22/2013  . Rheumatoid arthritis ([place]) 12/22/2013  . Colon cancer ([place]) 01/24/2013  . Cancer of middle lobe of lung ([place]) 01/24/2013  . Rheumatoid arthritis(714.0) 01/24/2013  . BPH (benign prostatic hyperplasia) 01/24/2013  . HTN (hypertension), benign 01/24/2013  . Anemia 08/26/2011    Past Surgical History:  Procedure Laterality Date  . APPENDECTOMY    . COLECTOMY  2004   right-hemi  . CYSTOSCOPY N/A 10/19/2015   Procedure: CYSTOSCOPY;  Surgeon: Carolan Clines, MD;  Location: [facility];  Service: Urology;  Laterality: N/A;  . GASTRECTOMY  1974   bleeding ulcers  . INGUINAL HERNIA REPAIR Right 06/03/2014   Procedure: HERNIA REPAIR INGUINAL ADULT;  Surgeon:  Kaylyn Lim, MD;  Location: [facility];  Service: General;  Laterality: Right;  . [place] SURGERY  2003  . lung lobectomy  2007   right  . tendons ruptured  2010   right-hand  . TRANSURETHRAL RESECTION OF PROSTATE N/A 10/19/2015   Procedure: TRANSURETHRAL RESECTION OF THE PROSTATE (TURP);  Surgeon: Carolan Clines, MD;  Location: [facility];  Service: Urology;  Laterality: N/A;  . VIDEO BRONCHOSCOPY WITH ENDOBRONCHIAL  NAVIGATION N/A 03/04/2016   Procedure: VIDEO BRONCHOSCOPY WITH BRUSHINGS AND BIOPSY;  Surgeon: Grace Isaac, MD;  Location: [facility];  Service: Thoracic;  Laterality: N/A;    Prior to Admission medications   Medication Sig Start Date End Date Taking? Authorizing Provider  acetaminophen (TYLENOL) 325 MG tablet Take 975 mg by mouth 3 (three) times daily as needed.    Historical Provider, MD  Ascorbic Acid (VITAMIN C PO) Take 1 tablet by mouth.    Historical Provider, MD  Calcium Carbonate-Vitamin D (CALTRATE 600+D PO) Take 1 tablet by mouth.    Historical Provider, MD  dronabinol (MARINOL) 2.5 MG capsule Take 1 capsule (2.5 mg total) by mouth 2 (two) times daily before a meal. 08/20/16   Curt Bears, MD  leflunomide (ARAVA) 10 MG tablet Take 10 mg by mouth daily.    Historical Provider, MD  loperamide (IMODIUM A-D) 2 MG tablet Take 2 mg by mouth 4 (four) times daily as needed for diarrhea or loose stools.    Historical Provider, MD  magnesium oxide (MAG-OX) 400 MG tablet Take 400 mg by mouth daily.    Historical Provider, MD  Melatonin 3 MG TABS Take 3 mg by mouth at bedtime as needed. 06/13/16   Historical Provider, MD  morphine (MS CONTIN) 15 MG 12 hr tablet Take 1 tablet (15 mg total) by mouth every 12 (twelve) hours. 08/20/16   Curt Bears, MD  omeprazole (PRILOSEC) 20 MG capsule Take 20 mg by mouth daily.     Historical Provider, MD  oxyCODONE (OXY IR/ROXICODONE) 5 MG immediate release tablet Take 1 tablet (5 mg total) by mouth every 6 (six) hours as needed for severe pain. 07/30/16   Curt Bears, MD  potassium chloride (K-DUR,KLOR-CON) 10 MEQ tablet Take 10 mEq by mouth 2 (two) times daily.    Historical Provider, MD  predniSONE (DELTASONE) 5 MG tablet TAKE 1 TABLET BY MOUTH DAILY AS DIRECTED 03/11/16   Historical Provider, MD  prochlorperazine (COMPAZINE) 10 MG tablet Take 10 mg by mouth daily as needed for nausea or vomiting.  03/18/16   Historical Provider, MD  VITAMIN E PO Take 1  tablet by mouth.    Historical Provider, MD    Allergies  Allergen Reactions  . Lorazepam Other (See Comments)    Paradoxical reaction, agitation/confusion  . Aspirin Other (See Comments)    stomach bleeding ulcers  . Diphenhydramine Hcl Other (See Comments)    restless leg, hyperactivity  . Remicade [Infliximab] Hives    Family History  Problem Relation Age of Onset  . Cancer Sister   . Cancer Brother   . Colon cancer Neg Hx   . Pancreatic cancer Neg Hx   . Stomach cancer Neg Hx     Social History Social History  Substance Use Topics  . Smoking status: Former Smoker    Quit date: 05/30/2006  . Smokeless tobacco: Never Used  . Alcohol use 11.4 oz/week    12 Cans of beer, 7 Shots of liquor per week  Comment: drinks beer 5 days a week    Review of Systems  Constitutional: Negative for fever. Eyes: Negative for visual changes. ENT: Negative for sore throat. Cardiovascular: Negative for chest pain. Respiratory: Negative for shortness of breath. Gastrointestinal: Negative for abdominal pain, vomiting and diarrhea. Genitourinary: Negative for dysuria.  Reported dark urine. Musculoskeletal: Negative for back pain. Skin: Negative for rash. Neurological: Negative for headache.  Some confusion, chronic. 10 point Review of Systems otherwise negative ____________________________________________   PHYSICAL EXAM:  VITAL SIGNS: ED Triage Vitals  Enc Vitals Group     BP 08/29/16 1430 (!) 163/58     Pulse Rate 08/29/16 1430 100     Resp 08/29/16 1430 14     Temp 08/29/16 1430 97.8 F (36.6 C)     Temp Source 08/29/16 1430 Oral     SpO2 08/29/16 1430 100 %     Weight 08/29/16 1429 120 lb 9.5 oz (54.7 kg)     Height 08/29/16 1429 '5\' 9"'$  (1.753 m)     Head Circumference --      Peak Flow --      Pain Score 08/29/16 1432 8     Pain Loc --      Pain Edu? --      Excl. in [place]? --     Constitutional: Alert and oriented. Well appearing and in no distress. HEENT    Head: Normocephalic and atraumatic.      Eyes: Conjunctivae are normal. PERRL. Normal extraocular movements.      Ears:         Nose: No congestion/rhinnorhea.   Mouth/Throat: Mucous membranes are mildly dry.   Neck: No stridor. Cardiovascular/Chest: Normal rate, regular rhythm.  No murmurs, rubs, or gallops. Respiratory: Normal respiratory effort without tachypnea nor retractions. Breath sounds are clear and equal bilaterally. No wheezes/rales/rhonchi. Gastrointestinal: Soft. No distention, no guarding, no rebound. Nontender.    Genitourinary/rectal:  Deferred Musculoskeletal: Nontender with normal range of motion in all extremities. No joint effusions.  No lower extremity tenderness.  No edema. Neurologic:  Normal speech and language. No gross or focal neurologic deficits are appreciated. Skin:  Skin is warm, dry and intact. No rash noted. Psychiatric: Mood and affect are normal. Speech and behavior are normal. Patient exhibits appropriate insight and judgment.   ____________________________________________  LABS (pertinent positives/negatives)  Labs Reviewed  COMPREHENSIVE METABOLIC PANEL - Abnormal; Notable for the following:       Result Value   Chloride 99 (*)    Glucose, Bld 110 (*)    Albumin 2.3 (*)    AST 12 (*)    ALT 8 (*)    All other components within normal limits  CBC WITH DIFFERENTIAL/PLATELET - Abnormal; Notable for the following:    RBC 3.25 (*)    Hemoglobin 10.1 (*)    HCT 30.4 (*)    RDW 15.0 (*)    Lymphs Abs 0.9 (*)    All other components within normal limits  TROPONIN I  URINALYSIS, COMPLETE (UACMP) WITH MICROSCOPIC    ____________________________________________    EKG I, Lisa Roca, MD, the attending physician have personally viewed and interpreted all ECGs.  102 beats.  apparent sinus tachycardia. Narrow QRS. Normal axis. Normal ST and T-wave ____________________________________________  RADIOLOGY All Xrays were viewed by  me. Imaging interpreted by Radiologist.  None __________________________________________  PROCEDURES  Procedure(s) performed: None  Critical Care performed: None  ____________________________________________   ED COURSE / ASSESSMENT AND PLAN  Pertinent labs & imaging results  that were available during my care of the patient were reviewed by me and considered in my medical decision making (see chart for details).   Patient looks mildly dehydrated, and was given IV fluids. The history is that he is failing to thrive at home due to decreased by mouth intake.  Ultimately his laboratory values are reassuring, in terms of no acute renal failure, no worsening anemia, no evidence for infection based on history or laboratory studies. Next an x-ray and I spoke with the family about discharge home today, but needs to follow up with cancer physician or primary care doctor to consider other supports at home including potentially hospice services.  Laboratory studies are reassuring. Second liter was given because patient has not able to provide urine stable yet.  Patient care transferred to Dr. Kerman Passey at shift change at 8:30 PM. Patient awaiting on urinalysis. I anticipate patient discharge home, and if urinalysis is negative for UTI, may be discharged when my prepared discharge instructions.    CONSULTATIONS:   None   Patient / Family / Caregiver informed of clinical course, medical decision-making process, and agree with plan.   I discussed return precautions, follow-up instructions, and discharge instructions with patient and/or family.   ___________________________________________   FINAL CLINICAL IMPRESSION(S) / ED DIAGNOSES   Final diagnoses:  Dehydration              Note: This dictation was prepared with Dragon dictation. Any transcriptional errors that result from this process are unintentional    Lisa Roca, MD 08/29/16 2030

## 2016-08-29 NOTE — Discharge Instructions (Signed)
You need to drink fluids and take in at least a nutrition shakes such as boost or ensure so that your body has the building blocks for healing. Return to the emergency department for any worsening condition including confusion or altered mental status, fever, dry mouth, not making urine, or any other symptoms concerning to you. Please discussed with her primary doctor about additional in-home services is including possible hospice services for decreased intake and dehydration.

## 2016-08-29 NOTE — ED Notes (Signed)
Pt attempting to obtain a urine specimen with urinal.

## 2016-08-29 NOTE — ED Notes (Signed)
Attempt made to in and out cath pt was unsuccessful. Second liter of NS infusing at this time. NAD.

## 2016-08-29 NOTE — ED Provider Notes (Signed)
-----------------------------------------   10:38 PM on 08/29/2016 -----------------------------------------  Patient's urinalysis has resulted in largely within normal limits, small amount of white blood cells, have added on urine culture. Patient will be discharged home per Dr. Alinda Money instructions.   Harvest Dark, MD 08/29/16 2238

## 2016-08-29 NOTE — ED Notes (Signed)
Bladder scan performed 374 found in bladder. Attempted a in and out cath with nurse Butch. Resistance was met when inserting catheter that would not along a successful in and out cath procedure to be done.

## 2016-08-29 NOTE — ED Triage Notes (Signed)
Per EMS report, patient has had poor PO intake for 3 weeks and no PO intake except sips of water when taking meds for 3 days. Patient has hx. 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 Patient has been confused. Patient is on Keytruda.

## 2016-08-29 NOTE — Telephone Encounter (Signed)
"  I'm calling about my husband and need Dr, Worthy Flank nurses voicemail.  I need them to tell me what to do.  He's not eaten in two days.  He's confused and doesn't realize he's not eating.  He's weak.  EMS would have to bring him in.  For two days he refused to take his medications.  Started Marinol last Friday.  It takes an hour to get medicines in him.  He drank 3 ounces yesterday.  Only went to the bathroom once in 24 hours small amount dark urine, not bloody, but dark.  He had a fall in September and non weight bearing or immobile since the fall."    Informed her I will notify provider.  Call transferred ext [phone] at her request.

## 2016-08-30 ENCOUNTER — Telehealth: Payer: Self-pay | Admitting: *Deleted

## 2016-08-30 NOTE — Telephone Encounter (Signed)
Clarisa Fling from [facility] called to follow up on conversation with Abelina Bachelor regarding Hospice. She states patient and family want to transition to Hospice. I got OK from Dr Benay Spice and faxed referral to [facility].

## 2016-08-30 NOTE — Telephone Encounter (Signed)
Phone call received from wife requesting hospice referral be made to [facility] [place] [facility] of [facility] hospice care.  Referral made and requested information faxed per wife request.  [phone].  Per hospice RN, phone call/home visit could be made tonight/this weekend after information via fax received.

## 2016-08-31 LAB — URINE CULTURE: CULTURE: NO GROWTH

## 2016-09-04 ENCOUNTER — Telehealth: Payer: Self-pay | Admitting: Medical Oncology

## 2016-09-04 NOTE — Telephone Encounter (Signed)
[facility] -orders given to discontinue ativan ( paradoxical reaction) and call back to clarify Morphine request.

## 2016-09-06 ENCOUNTER — Telehealth: Payer: Self-pay | Admitting: Internal Medicine

## 2016-09-06 NOTE — Telephone Encounter (Signed)
Per 12/22 sch msg cx appointments patient on hospice. Spoke with wife she is aware.

## 2016-09-10 ENCOUNTER — Ambulatory Visit: Payer: Medicare Other

## 2016-09-10 ENCOUNTER — Ambulatory Visit: Payer: Medicare Other | Admitting: Internal Medicine

## 2016-09-10 ENCOUNTER — Other Ambulatory Visit: Payer: Medicare Other

## 2016-09-11 ENCOUNTER — Other Ambulatory Visit: Payer: Self-pay | Admitting: Emergency Medicine

## 2016-09-11 MED ORDER — MORPHINE SULFATE (CONCENTRATE) 10 MG /0.5 ML PO SOLN: 10.0000 mg | ORAL | 0 refills | Status: AC | PRN

## 2016-09-11 MED ORDER — MORPHINE SULFATE ER 15 MG PO TBCR: 15.0000 mg | EXTENDED_RELEASE_TABLET | Freq: Two times a day (BID) | ORAL | 0 refills | Status: AC

## 2016-09-16 DEATH — deceased

## 2016-09-19 ENCOUNTER — Other Ambulatory Visit: Payer: Medicare Other

## 2016-09-19 ENCOUNTER — Inpatient Hospital Stay: Admission: RE | Admit: 2016-09-19 | Payer: Medicare Other | Source: Ambulatory Visit

## 2016-09-23 ENCOUNTER — Ambulatory Visit: Admission: RE | Admit: 2016-09-23 | Payer: Medicare Other | Source: Ambulatory Visit | Admitting: Radiation Oncology

## 2016-09-25 ENCOUNTER — Other Ambulatory Visit: Payer: Self-pay | Admitting: Nurse Practitioner

## 2016-10-01 ENCOUNTER — Ambulatory Visit: Payer: Medicare Other | Admitting: Internal Medicine

## 2016-10-01 ENCOUNTER — Other Ambulatory Visit: Payer: Medicare Other

## 2016-10-01 ENCOUNTER — Ambulatory Visit: Payer: Medicare Other

## 2016-10-17 DEATH — deceased

## 2017-02-22 IMAGING — XA Imaging study
2 series · 2 of 2 positions shown · non-contrast
Comparison: none

CLINICAL DATA: Lumbosacral spondylosis without myelopathy. Lumbar
radiculopathy. Low back pain and left lower extremity pain. The
patient reports no lasting relief following last month's
interlaminar epidural injection. An L5 nerve root
block/transforaminal epidural earlier this year provided good relief
and will be repeated today.

[Series 1: ortho standard · 1 of 1 slices shown (1 of 2)]
[im 1/1]
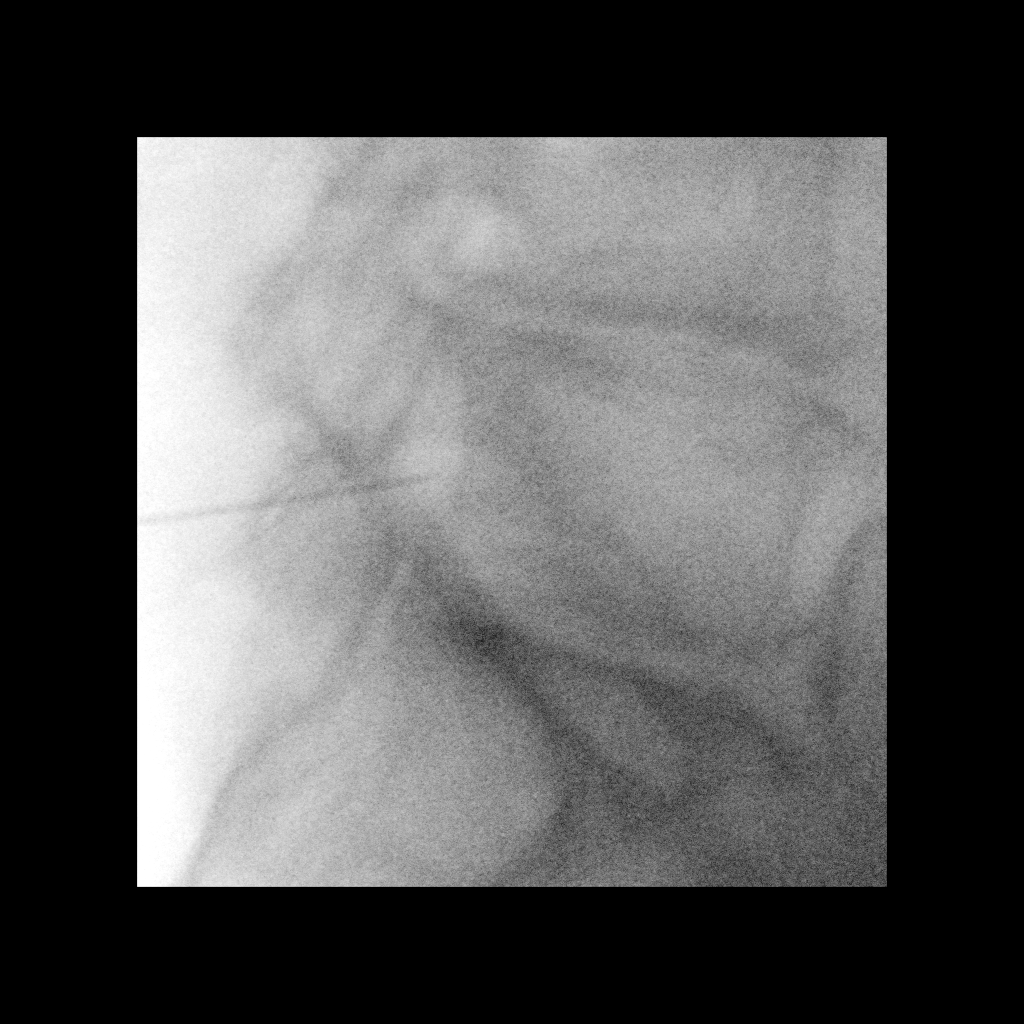

[Series 2: ortho standard · 1 of 1 slices shown (2 of 2)]
[im 1/1]
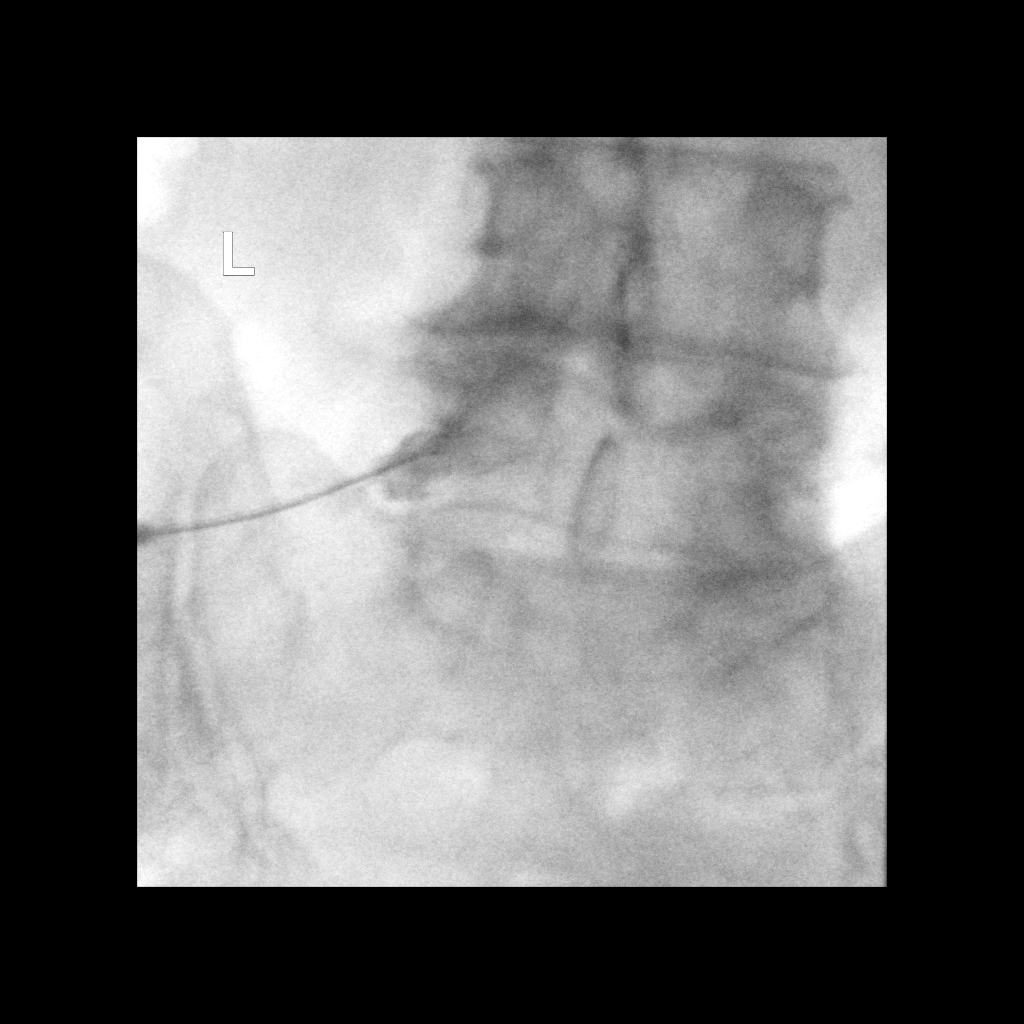

[2 of 2 positions shown; findings below may reference images not displayed]

EXAM:
DG INJECT/JORGES INC NEEDLE/TAYENNE/WEEMS EPI/EDONISI/SAC W/IMG

FLUOROSCOPY TIME:  Radiation Exposure Index (as provided by the
fluoroscopic device): 54.05 microGray*m^2

Fluoroscopy Time (in minutes and seconds):  24 seconds

PROCEDURE:
The procedure, risks, benefits, and alternatives were explained to
the patient. Questions regarding the procedure were encouraged and
answered. The patient understands and consents to the procedure.

LEFT L5 NERVE ROOT BLOCK AND TRANSFORAMINAL EPIDURAL: A posterior
oblique approach was taken to the intervertebral foramen on the left
at L5-S1 using a curved 3.5 inch 22 gauge spinal needle. Injection
of Isovue-M 200 outlined the left L5 nerve root and showed good
epidural spread. No vascular opacification is seen. 120 mg of
Depo-Medrol mixed with 1.5 mL of 1% lidocaine were instilled. The
procedure was well-tolerated, and the patient was discharged thirty
minutes following the injection in good condition.

COMPLICATIONS:
None
IMPRESSION: Technically successful injection consisting of a left L5 nerve root
block and transforaminal epidural.
